# Patient Record
Sex: Male | Born: 1945
Health system: Southern US, Community
[De-identification: ages and names within clinical notes are randomized; demographics above are authoritative.]

## PROBLEM LIST (undated history)

## (undated) DIAGNOSIS — J449 Chronic obstructive pulmonary disease, unspecified: Secondary | ICD-10-CM

## (undated) DIAGNOSIS — M67911 Unspecified disorder of synovium and tendon, right shoulder: Secondary | ICD-10-CM

## (undated) DIAGNOSIS — I10 Essential (primary) hypertension: Secondary | ICD-10-CM

## (undated) DIAGNOSIS — J45909 Unspecified asthma, uncomplicated: Secondary | ICD-10-CM

## (undated) DIAGNOSIS — M47812 Spondylosis without myelopathy or radiculopathy, cervical region: Secondary | ICD-10-CM

## (undated) DIAGNOSIS — E119 Type 2 diabetes mellitus without complications: Secondary | ICD-10-CM

## (undated) DIAGNOSIS — N429 Disorder of prostate, unspecified: Secondary | ICD-10-CM

## (undated) DIAGNOSIS — M1990 Unspecified osteoarthritis, unspecified site: Secondary | ICD-10-CM

## (undated) DIAGNOSIS — M67912 Unspecified disorder of synovium and tendon, left shoulder: Secondary | ICD-10-CM

## (undated) HISTORY — PX: PENILE ADHESIONS LYSIS: SHX2198

## (undated) HISTORY — PX: ANKLE SURGERY: SHX546

## (undated) HISTORY — PX: OTHER SURGICAL HISTORY: SHX169

---

## 2002-10-06 ENCOUNTER — Emergency Department (HOSPITAL_COMMUNITY): Admission: EM | Admit: 2002-10-06 | Discharge: 2002-10-06 | Payer: Self-pay | Admitting: Emergency Medicine

## 2003-04-27 ENCOUNTER — Emergency Department (HOSPITAL_COMMUNITY): Admission: EM | Admit: 2003-04-27 | Discharge: 2003-04-27 | Payer: Self-pay | Admitting: Emergency Medicine

## 2003-04-27 ENCOUNTER — Encounter: Payer: Self-pay | Admitting: Emergency Medicine

## 2013-03-16 ENCOUNTER — Emergency Department (HOSPITAL_COMMUNITY)
Admission: EM | Admit: 2013-03-16 | Discharge: 2013-03-16 | Disposition: A | Discharge status: Home / Self Care | Payer: Medicare Other | Attending: Emergency Medicine | Admitting: Emergency Medicine

## 2013-03-16 ENCOUNTER — Encounter (HOSPITAL_COMMUNITY): Payer: Self-pay | Admitting: Emergency Medicine

## 2013-03-16 DIAGNOSIS — T63461A Toxic effect of venom of wasps, accidental (unintentional), initial encounter: Secondary | ICD-10-CM | POA: Insufficient documentation

## 2013-03-16 DIAGNOSIS — R220 Localized swelling, mass and lump, head: Secondary | ICD-10-CM | POA: Insufficient documentation

## 2013-03-16 DIAGNOSIS — Z79899 Other long term (current) drug therapy: Secondary | ICD-10-CM | POA: Insufficient documentation

## 2013-03-16 DIAGNOSIS — J45909 Unspecified asthma, uncomplicated: Secondary | ICD-10-CM | POA: Insufficient documentation

## 2013-03-16 DIAGNOSIS — T6391XA Toxic effect of contact with unspecified venomous animal, accidental (unintentional), initial encounter: Secondary | ICD-10-CM | POA: Insufficient documentation

## 2013-03-16 DIAGNOSIS — Y929 Unspecified place or not applicable: Secondary | ICD-10-CM | POA: Insufficient documentation

## 2013-03-16 DIAGNOSIS — I10 Essential (primary) hypertension: Secondary | ICD-10-CM | POA: Insufficient documentation

## 2013-03-16 DIAGNOSIS — Y939 Activity, unspecified: Secondary | ICD-10-CM | POA: Insufficient documentation

## 2013-03-16 DIAGNOSIS — E119 Type 2 diabetes mellitus without complications: Secondary | ICD-10-CM | POA: Insufficient documentation

## 2013-03-16 DIAGNOSIS — L539 Erythematous condition, unspecified: Secondary | ICD-10-CM | POA: Insufficient documentation

## 2013-03-16 DIAGNOSIS — J449 Chronic obstructive pulmonary disease, unspecified: Secondary | ICD-10-CM | POA: Insufficient documentation

## 2013-03-16 DIAGNOSIS — M2540 Effusion, unspecified joint: Secondary | ICD-10-CM | POA: Insufficient documentation

## 2013-03-16 DIAGNOSIS — Z8739 Personal history of other diseases of the musculoskeletal system and connective tissue: Secondary | ICD-10-CM | POA: Insufficient documentation

## 2013-03-16 DIAGNOSIS — J4489 Other specified chronic obstructive pulmonary disease: Secondary | ICD-10-CM | POA: Insufficient documentation

## 2013-03-16 HISTORY — DX: Unspecified asthma, uncomplicated: J45.909

## 2013-03-16 HISTORY — DX: Type 2 diabetes mellitus without complications: E11.9

## 2013-03-16 HISTORY — DX: Chronic obstructive pulmonary disease, unspecified: J44.9

## 2013-03-16 HISTORY — DX: Unspecified osteoarthritis, unspecified site: M19.90

## 2013-03-16 HISTORY — DX: Essential (primary) hypertension: I10

## 2013-03-16 MED ORDER — DEXAMETHASONE SODIUM PHOSPHATE 10 MG/ML IJ SOLN
10.0000 mg | Freq: Once | INTRAMUSCULAR | Status: AC
  Administered 2013-03-16: 10 mg via INTRAMUSCULAR
  Filled 2013-03-16: qty 1

## 2013-03-16 NOTE — ED Notes (Signed)
Swelling, pain lt elbow and forearm. Says he was bitten by insect or ? Bee yesterday. Good radial pulse. Alert, NAD

## 2013-03-16 NOTE — ED Notes (Signed)
States that he was stung by a hornet yesterday and his left elbow is swollen and itching.

## 2013-03-18 NOTE — ED Provider Notes (Signed)
CSN: [id number]     Arrival date & time 03/16/13  1354 History     First MD Initiated Contact with Patient 03/16/13 1406     Chief Complaint  Patient presents with  . Insect Bite   (Consider location/radiation/quality/duration/timing/severity/associated sxs/prior Treatment) HPI Comments: Ronald Ward is a 67 y.o. Male presenting with pain and itching,  Swollen left elbow since being stung by a hornet yesterday afternoon.  He does have a history of cough allergy to bee stings, but denies this symptom today along with no complaint of shortness of breath, mouth, tongue or throat swelling or discomfort.  He took benadryl 25 mg prior to arrival.  He has not applied ice to the area. He denies radiation of pain.      The history is provided by the patient.    Past Medical History  Diagnosis Date  . Diabetes mellitus without complication   . COPD (chronic obstructive pulmonary disease)   . Hypertension   . Asthma   . Arthritis    History reviewed. No pertinent past surgical history. No family history on file. History  Substance Use Topics  . Smoking status: Not on file  . Smokeless tobacco: Not on file  . Alcohol Use: Not on file    Review of Systems  Constitutional: Negative for fever.  HENT: Positive for facial swelling. Negative for congestion and voice change.   Respiratory: Negative for shortness of breath, wheezing and stridor.   Musculoskeletal: Positive for joint swelling and arthralgias. Negative for myalgias.  Skin: Negative for color change, rash and wound.  Neurological: Negative for weakness and numbness.    Allergies  Bee venom; Fish allergy; and Shellfish allergy  Home Medications   Current Outpatient Rx  Name  Route  Sig  Dispense  Refill  . albuterol (PROVENTIL HFA;VENTOLIN HFA) 108 (90 BASE) MCG/ACT inhaler   Inhalation   Inhale 2 puffs into the lungs 2 (two) times daily.         . beclomethasone (QVAR) 40 MCG/ACT inhaler   Inhalation   Inhale 2  puffs into the lungs 2 (two) times daily.         Marland Kitchen doxazosin (CARDURA) 2 MG tablet   Oral   Take 2 mg by mouth daily.         . enalapril (VASOTEC) 2.5 MG tablet   Oral   Take 2.5 mg by mouth daily.         Marland Kitchen glipiZIDE (GLUCOTROL XL) 2.5 MG 24 hr tablet   Oral   Take 2.5 mg by mouth daily.         . hydrochlorothiazide (HYDRODIURIL) 25 MG tablet   Oral   Take 25 mg by mouth daily.         . metFORMIN (GLUCOPHAGE) 1000 MG tablet   Oral   Take 1,000 mg by mouth 2 (two) times daily with a meal.         . naproxen (NAPROSYN) 500 MG tablet   Oral   Take 500 mg by mouth 2 (two) times daily with a meal.         . simvastatin (ZOCOR) 20 MG tablet   Oral   Take 20 mg by mouth every evening.          There were no vitals taken for this visit. Physical Exam  Constitutional: He appears well-developed and well-nourished. No distress.  HENT:  Head: Normocephalic.  Neck: Neck supple.  Cardiovascular: Normal rate.   Pulmonary/Chest: Effort  normal. No stridor. No respiratory distress. He has no wheezes. He has no rales.  Musculoskeletal: Normal range of motion. He exhibits no edema.  Lymphadenopathy:    He has no cervical adenopathy.  Skin: No rash noted. There is erythema.  Localized edema and erythema at left posterior elbow.  No appreciable puncture wound or retained fb.  No red streaking.    ED Course   Procedures (including critical care time)  Labs Reviewed - No data to display No results found. 1. Bee sting reaction, initial encounter     MDM  Localized reaction to hornet/wasp sting.  No signs of anaphylaxis for this injury that occurred almost 24 hours ago.  Ice pack was applied to the site.  He was given decadron injection,  Encouraged continued ice at home,  Benadryl 25-50 mg q 6 hours for the next 2 days,  Then prn. No sign of infection at this site.  Evalee Jefferson, PA-C 03/18/13 [phone]

## 2013-03-18 NOTE — ED Provider Notes (Signed)
Medical screening examination/treatment/procedure(s) were performed by non-physician practitioner and as supervising physician I was immediately available for consultation/collaboration.   Ezequiel Essex, MD 03/18/13 1020

## 2013-05-19 ENCOUNTER — Ambulatory Visit (INDEPENDENT_AMBULATORY_CARE_PROVIDER_SITE_OTHER): Payer: Medicare Other | Admitting: Urology

## 2013-05-19 DIAGNOSIS — N400 Enlarged prostate without lower urinary tract symptoms: Secondary | ICD-10-CM

## 2013-05-19 DIAGNOSIS — N529 Male erectile dysfunction, unspecified: Secondary | ICD-10-CM

## 2013-05-19 DIAGNOSIS — R972 Elevated prostate specific antigen [PSA]: Secondary | ICD-10-CM

## 2013-07-21 ENCOUNTER — Ambulatory Visit: Payer: Medicare Other | Admitting: Urology

## 2013-08-18 ENCOUNTER — Ambulatory Visit (INDEPENDENT_AMBULATORY_CARE_PROVIDER_SITE_OTHER): Payer: Medicare HMO | Admitting: Urology

## 2013-08-18 DIAGNOSIS — R972 Elevated prostate specific antigen [PSA]: Secondary | ICD-10-CM

## 2013-08-25 ENCOUNTER — Other Ambulatory Visit: Payer: Self-pay | Admitting: Urology

## 2013-08-25 DIAGNOSIS — R972 Elevated prostate specific antigen [PSA]: Secondary | ICD-10-CM

## 2013-09-15 ENCOUNTER — Other Ambulatory Visit: Payer: Self-pay | Admitting: Urology

## 2013-09-15 ENCOUNTER — Ambulatory Visit (HOSPITAL_COMMUNITY)
Admission: RE | Admit: 2013-09-15 | Discharge: 2013-09-15 | Disposition: A | Discharge status: Home / Self Care | Payer: Medicare HMO | Source: Ambulatory Visit | Attending: Urology | Admitting: Urology

## 2013-09-15 DIAGNOSIS — R972 Elevated prostate specific antigen [PSA]: Secondary | ICD-10-CM

## 2013-09-15 MED ORDER — GENTAMICIN SULFATE 40 MG/ML IJ SOLN
160.0000 mg | Freq: Once | INTRAMUSCULAR | Status: AC
  Administered 2013-09-15: 160 mg via INTRAMUSCULAR

## 2013-09-15 MED ORDER — GENTAMICIN SULFATE 40 MG/ML IJ SOLN
INTRAMUSCULAR | Status: AC
  Administered 2013-09-15: 12:00:00 160 mg via INTRAMUSCULAR
  Filled 2013-09-15: qty 4

## 2013-09-15 MED ORDER — LIDOCAINE HCL (PF) 2 % IJ SOLN
10.0000 mL | Freq: Once | INTRAMUSCULAR | Status: AC
  Administered 2013-09-15: 10 mL

## 2013-09-15 MED ORDER — LIDOCAINE HCL (PF) 2 % IJ SOLN
INTRAMUSCULAR | Status: AC
  Administered 2013-09-15: 12:00:00 10 mL
  Filled 2013-09-15: qty 10

## 2013-09-15 NOTE — Discharge Instructions (Signed)
Prostate Biopsy TRUS Biopsy BEFORE THE TEST   Do not take aspirin. Do not take any medicine that has aspirin in it 7 days before your biopsy.  You may be given a medicine to take on the day of your biopsy.  You may also be given a medicine or treatment to help you go poop (laxative or enema). AFTER THE TEST  Only take medicine as told by your doctor.  It is normal to have some bleeding from your rectum for the first 5 days.  You may have blood in your pee (urine) or sperm. Finding out the results of your test Ask when your test results will be ready. Make sure you get your test results. GET HELP RIGHT AWAY IF:  You have a temperature by mouth above 102 F (38.9 C), not controlled by medicine.  You have blood in your pee for more than 5 days.  You have a lot of blood in your pee.  You have bleeding from your rectum for more than 5 days or have a lot of blood in your poop (feces).  You have severe pain. Document Released: 07/18/2009 Document Revised: 10/22/2011 Document Reviewed: 03/18/2013 [facility] Patient Information 2014 [place], [place].

## 2013-09-15 NOTE — Progress Notes (Signed)
Biopsy complete no signs of distress  

## 2013-09-21 ENCOUNTER — Encounter: Payer: Self-pay | Admitting: Urology

## 2013-10-06 ENCOUNTER — Ambulatory Visit (INDEPENDENT_AMBULATORY_CARE_PROVIDER_SITE_OTHER): Payer: Medicare HMO | Admitting: Urology

## 2013-10-06 DIAGNOSIS — R972 Elevated prostate specific antigen [PSA]: Secondary | ICD-10-CM

## 2013-10-06 DIAGNOSIS — N529 Male erectile dysfunction, unspecified: Secondary | ICD-10-CM

## 2014-01-18 ENCOUNTER — Other Ambulatory Visit (HOSPITAL_COMMUNITY): Payer: Self-pay | Admitting: Family Medicine

## 2014-01-18 DIAGNOSIS — M542 Cervicalgia: Secondary | ICD-10-CM

## 2014-01-20 ENCOUNTER — Ambulatory Visit (HOSPITAL_COMMUNITY)
Admission: RE | Admit: 2014-01-20 | Discharge: 2014-01-20 | Disposition: A | Discharge status: Home / Self Care | Payer: Medicare HMO | Source: Ambulatory Visit | Attending: Family Medicine | Admitting: Family Medicine

## 2014-01-20 ENCOUNTER — Encounter (HOSPITAL_COMMUNITY): Payer: Self-pay

## 2014-01-20 DIAGNOSIS — M5412 Radiculopathy, cervical region: Secondary | ICD-10-CM | POA: Insufficient documentation

## 2014-01-20 DIAGNOSIS — M25519 Pain in unspecified shoulder: Secondary | ICD-10-CM | POA: Insufficient documentation

## 2014-01-20 DIAGNOSIS — M4802 Spinal stenosis, cervical region: Secondary | ICD-10-CM | POA: Insufficient documentation

## 2014-01-20 DIAGNOSIS — M5020 Other cervical disc displacement, unspecified cervical region: Secondary | ICD-10-CM | POA: Insufficient documentation

## 2014-01-20 DIAGNOSIS — IMO0002 Reserved for concepts with insufficient information to code with codable children: Secondary | ICD-10-CM | POA: Insufficient documentation

## 2014-01-20 DIAGNOSIS — M542 Cervicalgia: Secondary | ICD-10-CM | POA: Insufficient documentation

## 2014-01-20 DIAGNOSIS — M5380 Other specified dorsopathies, site unspecified: Secondary | ICD-10-CM | POA: Insufficient documentation

## 2014-03-11 ENCOUNTER — Ambulatory Visit (INDEPENDENT_AMBULATORY_CARE_PROVIDER_SITE_OTHER): Payer: Medicare HMO

## 2014-03-11 ENCOUNTER — Ambulatory Visit: Payer: Self-pay | Admitting: Orthopedic Surgery

## 2014-03-11 ENCOUNTER — Ambulatory Visit (INDEPENDENT_AMBULATORY_CARE_PROVIDER_SITE_OTHER): Payer: Medicare HMO | Admitting: Orthopedic Surgery

## 2014-03-11 VITALS — BP 110/66 | Ht 67.0 in | Wt 152.0 lb

## 2014-03-11 DIAGNOSIS — M25519 Pain in unspecified shoulder: Secondary | ICD-10-CM

## 2014-03-11 DIAGNOSIS — M67919 Unspecified disorder of synovium and tendon, unspecified shoulder: Secondary | ICD-10-CM

## 2014-03-11 DIAGNOSIS — M719 Bursopathy, unspecified: Secondary | ICD-10-CM

## 2014-03-11 DIAGNOSIS — M67912 Unspecified disorder of synovium and tendon, left shoulder: Principal | ICD-10-CM

## 2014-03-11 DIAGNOSIS — M47812 Spondylosis without myelopathy or radiculopathy, cervical region: Secondary | ICD-10-CM | POA: Insufficient documentation

## 2014-03-11 DIAGNOSIS — M19019 Primary osteoarthritis, unspecified shoulder: Secondary | ICD-10-CM | POA: Insufficient documentation

## 2014-03-11 DIAGNOSIS — M67911 Unspecified disorder of synovium and tendon, right shoulder: Secondary | ICD-10-CM

## 2014-03-11 MED ORDER — NABUMETONE 500 MG PO TABS: 500.0000 mg | ORAL_TABLET | Freq: Two times a day (BID) | ORAL | Status: DC

## 2014-03-11 NOTE — Progress Notes (Signed)
Subjective:     Patient ID: Ronald Ward, male   DOB: July 23, 1946, 68 y.o.   MRN: [id number]  Shoulder Pain    Chief Complaint  Patient presents with  . Shoulder Pain    Bilateral shoulder pain. Consult from DR. Cabbell    68 year old male 6 month history of bilateral shoulder pain described as being in the joint. He complains of sharp discomfort in the anterior joint line and the morning and night which is 6/10 unrelieved by oxycodone 5 mg 3 times a day. He took some Naprosyn but is concerned about the GI side effects. He comes in for evaluation and treatment at the request of Dr. Cyndy Freeze neurosurgeon. He does not complain of stiffness or weakness or loss of motion. His medical history of diabetes asthma emphysema and generalized arthritis  He had surgery on his left ankle and he had mandibular  He denies medication allergies  Family history of diabetes hypertension and cancer  Social history denies any drug use  Current medications oxycodone 5 mg 3 times a day along with the following Current outpatient prescriptions:albuterol (PROVENTIL HFA;VENTOLIN HFA) 108 (90 BASE) MCG/ACT inhaler, Inhale 2 puffs into the lungs 2 (two) times daily., Disp: , Rfl: ;  beclomethasone (QVAR) 40 MCG/ACT inhaler, Inhale 2 puffs into the lungs 2 (two) times daily., Disp: , Rfl: ;  doxazosin (CARDURA) 2 MG tablet, Take 2 mg by mouth daily., Disp: , Rfl: ;  enalapril (VASOTEC) 2.5 MG tablet, Take 2.5 mg by mouth daily., Disp: , Rfl:  glipiZIDE (GLUCOTROL XL) 2.5 MG 24 hr tablet, Take 2.5 mg by mouth daily., Disp: , Rfl: ;  hydrochlorothiazide (HYDRODIURIL) 25 MG tablet, Take 25 mg by mouth daily., Disp: , Rfl: ;  metFORMIN (GLUCOPHAGE) 1000 MG tablet, Take 1,000 mg by mouth 2 (two) times daily with a meal., Disp: , Rfl: ;  nabumetone (RELAFEN) 500 MG tablet, Take 1 tablet (500 mg total) by mouth 2 (two) times daily., Disp: 60 tablet, Rfl: 1 naproxen (NAPROSYN) 500 MG tablet, Take 500 mg by mouth 2 (two) times  daily with a meal., Disp: , Rfl: ;  simvastatin (ZOCOR) 20 MG tablet, Take 20 mg by mouth every evening., Disp: , Rfl:    Review of Systems Review of systems has been recorded reviewed and signed and scanned into the chart     Objective:   Physical Exam BP 110/66  Ht 5\' 7"  (1.702 m)  Wt 152 lb (68.947 kg)  BMI 23.80 kg/m2 general appearance is normal. He is oriented x3 his mood and affect are normal. He has normal gait pattern without nystagmus  He has mild tenderness in his cervical spine no deformity. Some stiffness with minimal range of motion loss. Muscle tone is normal. Skin is normal except for multiple moles.  Lower extremity exam The right and left lower extremity: Inspection and palpation revealed no tenderness or abnormality in alignment in the lower extremities. Range of motion is full.  Strength is grade 5. All joints are stable.  As far as his shoulder goes he has tenderness in the anterior joint line he has normal external rotation and forward elevation with decreased internal rotation bilaterally. Both shoulders are stable. Both rotator cuff set show 5 out of 5 manual muscle testing skin along the arm distal pulses radial and ulnar normal lymph nodes normal sensation normal reflexes normal coordination normal   Cervical spine MRI shows 4 level degenerative disc disease. There is some evidence of nerve root impingement.  Shoulder  x-ray show mild shoulder arthritis with a patent glenohumeral joint and mild inferior osteophytes. Mild greater tuberosity chronic sclerosis and acromial subacromial sclerosis suggests chronic rotator cuff disease     Assessment:     Arthritis of the glenohumeral joint Cervical spondylosis Chronic rotator cuff disease    Plan:     Subacromial injection, Relafen 500 mg twice a day

## 2014-03-11 NOTE — Patient Instructions (Signed)

## 2014-04-06 ENCOUNTER — Ambulatory Visit (INDEPENDENT_AMBULATORY_CARE_PROVIDER_SITE_OTHER): Payer: Medicare HMO | Admitting: Urology

## 2014-04-06 DIAGNOSIS — R972 Elevated prostate specific antigen [PSA]: Secondary | ICD-10-CM

## 2014-04-06 DIAGNOSIS — N400 Enlarged prostate without lower urinary tract symptoms: Secondary | ICD-10-CM

## 2014-04-06 DIAGNOSIS — N529 Male erectile dysfunction, unspecified: Secondary | ICD-10-CM

## 2014-09-02 ENCOUNTER — Emergency Department (HOSPITAL_COMMUNITY): Payer: No Typology Code available for payment source

## 2014-09-02 ENCOUNTER — Encounter (HOSPITAL_COMMUNITY): Payer: Self-pay | Admitting: Cardiology

## 2014-09-02 ENCOUNTER — Emergency Department (HOSPITAL_COMMUNITY)
Admission: EM | Admit: 2014-09-02 | Discharge: 2014-09-02 | Disposition: A | Discharge status: Home / Self Care | Payer: No Typology Code available for payment source | Attending: Emergency Medicine | Admitting: Emergency Medicine

## 2014-09-02 DIAGNOSIS — Y9389 Activity, other specified: Secondary | ICD-10-CM | POA: Diagnosis not present

## 2014-09-02 DIAGNOSIS — M1990 Unspecified osteoarthritis, unspecified site: Secondary | ICD-10-CM | POA: Insufficient documentation

## 2014-09-02 DIAGNOSIS — S299XXA Unspecified injury of thorax, initial encounter: Secondary | ICD-10-CM | POA: Diagnosis present

## 2014-09-02 DIAGNOSIS — J449 Chronic obstructive pulmonary disease, unspecified: Secondary | ICD-10-CM | POA: Insufficient documentation

## 2014-09-02 DIAGNOSIS — E119 Type 2 diabetes mellitus without complications: Secondary | ICD-10-CM | POA: Diagnosis not present

## 2014-09-02 DIAGNOSIS — S134XXA Sprain of ligaments of cervical spine, initial encounter: Secondary | ICD-10-CM | POA: Insufficient documentation

## 2014-09-02 DIAGNOSIS — S43401A Unspecified sprain of right shoulder joint, initial encounter: Secondary | ICD-10-CM | POA: Insufficient documentation

## 2014-09-02 DIAGNOSIS — R52 Pain, unspecified: Secondary | ICD-10-CM

## 2014-09-02 DIAGNOSIS — Y92410 Unspecified street and highway as the place of occurrence of the external cause: Secondary | ICD-10-CM | POA: Diagnosis not present

## 2014-09-02 DIAGNOSIS — S8391XA Sprain of unspecified site of right knee, initial encounter: Secondary | ICD-10-CM | POA: Insufficient documentation

## 2014-09-02 DIAGNOSIS — Z791 Long term (current) use of non-steroidal anti-inflammatories (NSAID): Secondary | ICD-10-CM | POA: Diagnosis not present

## 2014-09-02 DIAGNOSIS — S298XXA Other specified injuries of thorax, initial encounter: Secondary | ICD-10-CM

## 2014-09-02 DIAGNOSIS — I10 Essential (primary) hypertension: Secondary | ICD-10-CM | POA: Diagnosis not present

## 2014-09-02 DIAGNOSIS — Y998 Other external cause status: Secondary | ICD-10-CM | POA: Insufficient documentation

## 2014-09-02 DIAGNOSIS — Z720 Tobacco use: Secondary | ICD-10-CM | POA: Diagnosis not present

## 2014-09-02 DIAGNOSIS — S139XXA Sprain of joints and ligaments of unspecified parts of neck, initial encounter: Secondary | ICD-10-CM

## 2014-09-02 DIAGNOSIS — Z7952 Long term (current) use of systemic steroids: Secondary | ICD-10-CM | POA: Insufficient documentation

## 2014-09-02 DIAGNOSIS — Z23 Encounter for immunization: Secondary | ICD-10-CM | POA: Diagnosis not present

## 2014-09-02 MED ORDER — OXYCODONE-ACETAMINOPHEN 5-325 MG PO TABS
2.0000 | ORAL_TABLET | Freq: Once | ORAL | Status: AC
  Administered 2014-09-02: 2 via ORAL
  Filled 2014-09-02: qty 2

## 2014-09-02 MED ORDER — OXYCODONE-ACETAMINOPHEN 5-325 MG PO TABS: 1.0000 | ORAL_TABLET | ORAL | Status: DC | PRN

## 2014-09-02 MED ORDER — TETANUS-DIPHTH-ACELL PERTUSSIS 5-2.5-18.5 LF-MCG/0.5 IM SUSP
0.5000 mL | Freq: Once | INTRAMUSCULAR | Status: AC
  Administered 2014-09-02: 0.5 mL via INTRAMUSCULAR
  Filled 2014-09-02: qty 0.5

## 2014-09-02 NOTE — ED Notes (Signed)
Patient ambulated in hall without assistance.  Patient denies any dizziness or SOB.  O2 Sat maintained at 96% - 97%.
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
## 2014-12-15 ENCOUNTER — Other Ambulatory Visit (HOSPITAL_COMMUNITY): Payer: Self-pay | Admitting: Family Medicine

## 2014-12-15 DIAGNOSIS — R52 Pain, unspecified: Secondary | ICD-10-CM

## 2014-12-17 ENCOUNTER — Ambulatory Visit (HOSPITAL_COMMUNITY)
Admission: RE | Admit: 2014-12-17 | Discharge: 2014-12-17 | Disposition: A | Discharge status: Home / Self Care | Payer: Medicare HMO | Source: Ambulatory Visit | Attending: Family Medicine | Admitting: Family Medicine

## 2014-12-17 DIAGNOSIS — R109 Unspecified abdominal pain: Secondary | ICD-10-CM | POA: Diagnosis not present

## 2014-12-17 DIAGNOSIS — R52 Pain, unspecified: Secondary | ICD-10-CM

## 2014-12-17 LAB — POCT I-STAT CREATININE: Creatinine, Ser: 1.1 mg/dL (ref 0.61–1.24)

## 2014-12-17 MED ORDER — IOHEXOL 300 MG/ML  SOLN
100.0000 mL | Freq: Once | INTRAMUSCULAR | Status: AC | PRN
  Administered 2014-12-17: 100 mL via INTRAVENOUS

## 2015-04-05 ENCOUNTER — Ambulatory Visit (INDEPENDENT_AMBULATORY_CARE_PROVIDER_SITE_OTHER): Payer: Medicare HMO | Admitting: Urology

## 2015-04-05 DIAGNOSIS — R972 Elevated prostate specific antigen [PSA]: Secondary | ICD-10-CM | POA: Diagnosis not present

## 2015-04-05 DIAGNOSIS — N400 Enlarged prostate without lower urinary tract symptoms: Secondary | ICD-10-CM

## 2015-04-05 DIAGNOSIS — N5201 Erectile dysfunction due to arterial insufficiency: Secondary | ICD-10-CM

## 2015-05-31 ENCOUNTER — Ambulatory Visit (INDEPENDENT_AMBULATORY_CARE_PROVIDER_SITE_OTHER): Payer: Medicare HMO | Admitting: Urology

## 2015-05-31 DIAGNOSIS — N5201 Erectile dysfunction due to arterial insufficiency: Secondary | ICD-10-CM

## 2015-05-31 DIAGNOSIS — N401 Enlarged prostate with lower urinary tract symptoms: Secondary | ICD-10-CM

## 2015-10-03 ENCOUNTER — Emergency Department (HOSPITAL_COMMUNITY): Payer: Medicare HMO

## 2015-10-03 ENCOUNTER — Emergency Department (HOSPITAL_COMMUNITY)
Admission: EM | Admit: 2015-10-03 | Discharge: 2015-10-03 | Disposition: A | Discharge status: Home / Self Care | Payer: Medicare HMO | Attending: Emergency Medicine | Admitting: Emergency Medicine

## 2015-10-03 ENCOUNTER — Encounter (HOSPITAL_COMMUNITY): Payer: Self-pay | Admitting: *Deleted

## 2015-10-03 DIAGNOSIS — J45909 Unspecified asthma, uncomplicated: Secondary | ICD-10-CM | POA: Diagnosis not present

## 2015-10-03 DIAGNOSIS — Z7984 Long term (current) use of oral hypoglycemic drugs: Secondary | ICD-10-CM | POA: Insufficient documentation

## 2015-10-03 DIAGNOSIS — I10 Essential (primary) hypertension: Secondary | ICD-10-CM | POA: Diagnosis not present

## 2015-10-03 DIAGNOSIS — F17200 Nicotine dependence, unspecified, uncomplicated: Secondary | ICD-10-CM | POA: Insufficient documentation

## 2015-10-03 DIAGNOSIS — J440 Chronic obstructive pulmonary disease with acute lower respiratory infection: Secondary | ICD-10-CM | POA: Diagnosis not present

## 2015-10-03 DIAGNOSIS — E119 Type 2 diabetes mellitus without complications: Secondary | ICD-10-CM | POA: Insufficient documentation

## 2015-10-03 DIAGNOSIS — Z79899 Other long term (current) drug therapy: Secondary | ICD-10-CM | POA: Diagnosis not present

## 2015-10-03 DIAGNOSIS — R05 Cough: Secondary | ICD-10-CM | POA: Diagnosis present

## 2015-10-03 DIAGNOSIS — J40 Bronchitis, not specified as acute or chronic: Secondary | ICD-10-CM

## 2015-10-03 LAB — BASIC METABOLIC PANEL
ANION GAP: 8 (ref 5–15)
BUN: 17 mg/dL (ref 6–20)
CALCIUM: 9.5 mg/dL (ref 8.9–10.3)
CO2: 31 mmol/L (ref 22–32)
Chloride: 101 mmol/L (ref 101–111)
Creatinine, Ser: 0.96 mg/dL (ref 0.61–1.24)
Glucose, Bld: 226 mg/dL — ABNORMAL HIGH (ref 65–99)
POTASSIUM: 4.6 mmol/L (ref 3.5–5.1)
SODIUM: 140 mmol/L (ref 135–145)

## 2015-10-03 LAB — CBC WITH DIFFERENTIAL/PLATELET
BASOS ABS: 0 10*3/uL (ref 0.0–0.1)
Basophils Relative: 0 %
EOS ABS: 0.1 10*3/uL (ref 0.0–0.7)
EOS PCT: 1 %
HCT: 41.3 % (ref 39.0–52.0)
Hemoglobin: 13.8 g/dL (ref 13.0–17.0)
LYMPHS ABS: 2.6 10*3/uL (ref 0.7–4.0)
Lymphocytes Relative: 29 %
MCH: 31.4 pg (ref 26.0–34.0)
MCHC: 33.4 g/dL (ref 30.0–36.0)
MCV: 94.1 fL (ref 78.0–100.0)
Monocytes Absolute: 1.2 10*3/uL — ABNORMAL HIGH (ref 0.1–1.0)
Monocytes Relative: 13 %
NEUTROS PCT: 57 %
Neutro Abs: 5.2 10*3/uL (ref 1.7–7.7)
PLATELETS: 271 10*3/uL (ref 150–400)
RBC: 4.39 MIL/uL (ref 4.22–5.81)
RDW: 13.1 % (ref 11.5–15.5)
WBC: 9 10*3/uL (ref 4.0–10.5)

## 2015-10-03 MED ORDER — GUAIFENESIN 100 MG/5ML PO LIQD: 200.0000 mg | ORAL | Status: DC | PRN

## 2015-10-03 MED ORDER — PREDNISONE 20 MG PO TABS: 40.0000 mg | ORAL_TABLET | Freq: Every day | ORAL | Status: DC

## 2015-10-03 MED ORDER — DOXYCYCLINE HYCLATE 100 MG PO CAPS: 100.0000 mg | ORAL_CAPSULE | Freq: Two times a day (BID) | ORAL | Status: DC

## 2015-10-03 MED ORDER — PREDNISONE 50 MG PO TABS
60.0000 mg | ORAL_TABLET | Freq: Once | ORAL | Status: AC
  Administered 2015-10-03: 60 mg via ORAL
  Filled 2015-10-03: qty 1

## 2015-10-03 MED ORDER — ALBUTEROL SULFATE (2.5 MG/3ML) 0.083% IN NEBU
2.5000 mg | INHALATION_SOLUTION | Freq: Once | RESPIRATORY_TRACT | Status: AC
  Administered 2015-10-03: 2.5 mg via RESPIRATORY_TRACT
  Filled 2015-10-03: qty 3

## 2015-10-03 MED ORDER — IPRATROPIUM-ALBUTEROL 0.5-2.5 (3) MG/3ML IN SOLN
3.0000 mL | Freq: Once | RESPIRATORY_TRACT | Status: AC
  Administered 2015-10-03: 3 mL via RESPIRATORY_TRACT
  Filled 2015-10-03: qty 3

## 2015-10-03 NOTE — ED Provider Notes (Signed)
Patient seen and examined. Discussed with Tammy Triplett PA-C. Please see her associated note. Patient with clear lungs. Some scattered rhonchi. Normal chest x-ray. Not febrile or hypoxemic. Agree with treatment with antibiotics, steroids, bronchodilators, mucolytic's.  Tanna Furry, MD 10/03/15 [phone]

## 2015-10-03 NOTE — ED Notes (Signed)
Ambulated in hallway.  Tolerated well.  Sats 96-99% on RA and HR 80-88.

## 2015-10-03 NOTE — ED Notes (Signed)
Pt states productive cough x 2 weeks (thick and yellow in color). States intermittent fever and chills.

## 2015-10-10 NOTE — ED Provider Notes (Cosign Needed)
CSN: [id number]     Arrival date & time 10/03/15  1137 History   First MD Initiated Contact with Patient 10/03/15 1607     Chief Complaint  Patient presents with  . Cough     (Consider location/radiation/quality/duration/timing/severity/associated sxs/prior Treatment) HPI   Ronald Ward is a 70 y.o. male with hx of COPD, DM and asthma who presents to the Emergency Department complaining of productive cough for 2 weeks.  Cough productive of yellow sputum and also has associated fever, nasal congestion and chills at home, Tmax unknown.  He states that he has been using his inhaler and taking OTC cold medications without relief.  He denies shortness of breath, chest pain, abdominal pain or recent wheezing.      Past Medical History  Diagnosis Date  . Diabetes mellitus without complication ([place])   . COPD (chronic obstructive pulmonary disease) ([place])   . Hypertension   . Asthma   . Arthritis    History reviewed. No pertinent past surgical history. No family history on file. Social History  Substance Use Topics  . Smoking status: Current Every Day Smoker  . Smokeless tobacco: None  . Alcohol Use: No    Review of Systems  Constitutional: Negative for fever, chills and fatigue.  HENT: Negative for sore throat and trouble swallowing.   Respiratory: Positive for cough. Negative for shortness of breath and wheezing.   Cardiovascular: Negative for chest pain and palpitations.  Gastrointestinal: Negative for nausea, vomiting, abdominal pain and blood in stool.  Genitourinary: Negative for dysuria and flank pain.  Musculoskeletal: Negative for myalgias, back pain, arthralgias, neck pain and neck stiffness.  Skin: Negative for rash.  Neurological: Negative for dizziness, weakness and numbness.  Hematological: Does not bruise/bleed easily.      Allergies  Bee venom; Fish allergy; and Shellfish allergy  Home Medications   Prior to Admission medications   Medication Sig Start  Date End Date Taking? Authorizing Provider  albuterol (PROVENTIL HFA;VENTOLIN HFA) 108 (90 BASE) MCG/ACT inhaler Inhale 2 puffs into the lungs 2 (two) times daily.   Yes Historical Provider, MD  beclomethasone (QVAR) 40 MCG/ACT inhaler Inhale 2 puffs into the lungs 2 (two) times daily.   Yes Historical Provider, MD  doxazosin (CARDURA) 2 MG tablet Take 2 mg by mouth daily.   Yes Historical Provider, MD  enalapril (VASOTEC) 2.5 MG tablet Take 2.5 mg by mouth daily.   Yes Historical Provider, MD  glipiZIDE (GLUCOTROL XL) 2.5 MG 24 hr tablet Take 2.5 mg by mouth daily.   Yes Historical Provider, MD  HYDROcodone-acetaminophen (NORCO/VICODIN) 5-325 MG tablet Take 1 tablet by mouth 4 (four) times daily.   Yes Historical Provider, MD  metFORMIN (GLUCOPHAGE) 1000 MG tablet Take 1,000 mg by mouth 2 (two) times daily with a meal.   Yes Historical Provider, MD  Phenyleph-CPM-DM-Aspirin (ALKA-SELTZER PLUS COLD & COUGH) 7.03-14-09-325 MG TBEF Take 1 tablet by mouth daily as needed (for cold and flu like symptoms).   Yes Historical Provider, MD  Phenylephrine-DM-GG-APAP (TYLENOL COLD/FLU SEVERE) 5-10-200-325 MG TABS Take 1-2 capsules by mouth daily as needed (for cold and flu like symptoms).   Yes Historical Provider, MD  simvastatin (ZOCOR) 20 MG tablet Take 20 mg by mouth every evening.   Yes Historical Provider, MD  doxycycline (VIBRAMYCIN) 100 MG capsule Take 1 capsule (100 mg total) by mouth 2 (two) times daily. 10/03/15   Oakes Mccready, PA-C  guaiFENesin (ROBITUSSIN) 100 MG/5ML liquid Take 10 mLs (200 mg total) by mouth  every 4 (four) hours as needed for cough. 10/03/15   Jasyn Mey, PA-C  predniSONE (DELTASONE) 20 MG tablet Take 2 tablets (40 mg total) by mouth daily. For 5 days 10/03/15   Radley Barto, PA-C   BP 173/79 mmHg  Pulse 81  Temp(Src) 98.9 F (37.2 C) (Oral)  Resp 20  Ht 5\' 6"  (1.676 m)  Wt 72.576 kg  BMI 25.84 kg/m2  SpO2 96% Physical Exam  Constitutional: He is oriented to person,  place, and time. He appears well-developed and well-nourished. No distress.  HENT:  Head: Normocephalic and atraumatic.  Right Ear: Tympanic membrane and ear canal normal.  Left Ear: Tympanic membrane and ear canal normal.  Mouth/Throat: Uvula is midline, oropharynx is clear and moist and mucous membranes are normal. No oropharyngeal exudate.  Eyes: EOM are normal. Pupils are equal, round, and reactive to light.  Neck: Normal range of motion, full passive range of motion without pain and phonation normal. Neck supple.  Cardiovascular: Normal rate, regular rhythm, normal heart sounds and intact distal pulses.   No murmur heard. Pulmonary/Chest: Effort normal. No stridor. No respiratory distress. He has no rales. He exhibits no tenderness.  Coarse lungs sounds bilaterally with few expiratory wheezes  Abdominal: Soft. He exhibits no distension. There is no tenderness. There is no rebound and no guarding.  Musculoskeletal: Normal range of motion. He exhibits no edema.  Lymphadenopathy:    He has no cervical adenopathy.  Neurological: He is alert and oriented to person, place, and time. He exhibits normal muscle tone. Coordination normal.  Skin: Skin is warm and dry.  Nursing note and vitals reviewed.   ED Course  Procedures (including critical care time) Labs Review Labs Reviewed  CBC WITH DIFFERENTIAL/PLATELET - Abnormal; Notable for the following:    Monocytes Absolute 1.2 (*)    All other components within normal limits  BASIC METABOLIC PANEL - Abnormal; Notable for the following:    Glucose, Bld 226 (*)    All other components within normal limits    Imaging Review Dg Chest 2 View  10/03/2015  CLINICAL DATA:  Productive cough for the past 2 weeks. Intermittent fever and chills. EXAM: CHEST  2 VIEW COMPARISON:  09/02/2014. FINDINGS: Normal sized heart. Stable elevation of the left hemidiaphragm. Small amount of left basilar scarring. Otherwise, clear lungs. Mild thoracic spine  degenerative changes. Bilateral shoulder degenerative changes. IMPRESSION: No acute abnormality. Electronically Signed   By: Claudie Revering M.D.   On: 10/03/2015 12:50    I have personally reviewed and evaluated these images and lab results as part of my medical decision-making.   EKG Interpretation None      MDM   Final diagnoses:  Bronchitis    Pt actively coughing, no rales.  Well appearing, non-toxic.  Ambulated in the dept w/o distress or hypoxia.  Pt also seen by Dr. Jeneen Rinks and care plan discussed.  Pt appears stable for d/c and agrees to prednisone, doxy and robitussin for symptomatic relief.  He also agrees to close f/u in 2-3 days for recheck and advised to return here for any worsening symptoms    Kem Parkinson, PA-C 10/11/15 2013

## 2015-11-17 NOTE — ED Provider Notes (Signed)
Pt seen by Tammy Triplett PA-C.  Medical screening examination/treatment/procedure(s) were performed by non-physician practitioner and as supervising physician I was immediately available for consultation/collaboration.   EKG Interpretation None        Tanna Furry, MD 11/17/15 2350

## 2015-12-23 LAB — BASIC METABOLIC PANEL: Glucose: 174 mg/dL

## 2016-04-08 ENCOUNTER — Emergency Department (HOSPITAL_COMMUNITY)
Admission: EM | Admit: 2016-04-08 | Discharge: 2016-04-09 | Disposition: A | Discharge status: Home / Self Care | Payer: Medicare HMO | Attending: Emergency Medicine | Admitting: Emergency Medicine

## 2016-04-08 ENCOUNTER — Encounter (HOSPITAL_COMMUNITY): Payer: Self-pay

## 2016-04-08 DIAGNOSIS — Y9389 Activity, other specified: Secondary | ICD-10-CM | POA: Insufficient documentation

## 2016-04-08 DIAGNOSIS — I10 Essential (primary) hypertension: Secondary | ICD-10-CM | POA: Insufficient documentation

## 2016-04-08 DIAGNOSIS — W57XXXA Bitten or stung by nonvenomous insect and other nonvenomous arthropods, initial encounter: Secondary | ICD-10-CM | POA: Diagnosis not present

## 2016-04-08 DIAGNOSIS — J449 Chronic obstructive pulmonary disease, unspecified: Secondary | ICD-10-CM | POA: Diagnosis not present

## 2016-04-08 DIAGNOSIS — Y999 Unspecified external cause status: Secondary | ICD-10-CM | POA: Diagnosis not present

## 2016-04-08 DIAGNOSIS — Z7984 Long term (current) use of oral hypoglycemic drugs: Secondary | ICD-10-CM | POA: Insufficient documentation

## 2016-04-08 DIAGNOSIS — J45909 Unspecified asthma, uncomplicated: Secondary | ICD-10-CM | POA: Insufficient documentation

## 2016-04-08 DIAGNOSIS — T63441A Toxic effect of venom of bees, accidental (unintentional), initial encounter: Secondary | ICD-10-CM | POA: Diagnosis not present

## 2016-04-08 DIAGNOSIS — E119 Type 2 diabetes mellitus without complications: Secondary | ICD-10-CM | POA: Diagnosis not present

## 2016-04-08 DIAGNOSIS — F17200 Nicotine dependence, unspecified, uncomplicated: Secondary | ICD-10-CM | POA: Insufficient documentation

## 2016-04-08 DIAGNOSIS — T7840XA Allergy, unspecified, initial encounter: Secondary | ICD-10-CM | POA: Diagnosis not present

## 2016-04-08 DIAGNOSIS — Z79899 Other long term (current) drug therapy: Secondary | ICD-10-CM | POA: Diagnosis not present

## 2016-04-08 DIAGNOSIS — Y929 Unspecified place or not applicable: Secondary | ICD-10-CM | POA: Diagnosis not present

## 2016-04-08 DIAGNOSIS — S61101A Unspecified open wound of right thumb with damage to nail, initial encounter: Secondary | ICD-10-CM | POA: Diagnosis not present

## 2016-04-08 DIAGNOSIS — IMO0002 Reserved for concepts with insufficient information to code with codable children: Secondary | ICD-10-CM

## 2016-04-08 MED ORDER — EPINEPHRINE 0.3 MG/0.3ML IJ SOAJ: 0.3000 mg | Freq: Once | INTRAMUSCULAR | 1 refills | Status: AC | PRN

## 2016-04-08 MED ORDER — CEPHALEXIN 500 MG PO CAPS: 500.0000 mg | ORAL_CAPSULE | Freq: Four times a day (QID) | ORAL | 0 refills | Status: DC

## 2016-04-08 MED ORDER — DIPHENHYDRAMINE HCL 50 MG/ML IJ SOLN
INTRAMUSCULAR | Status: AC
  Filled 2016-04-08: qty 1

## 2016-04-08 MED ORDER — METHYLPREDNISOLONE SODIUM SUCC 125 MG IJ SOLR
INTRAMUSCULAR | Status: AC
  Filled 2016-04-08: qty 2

## 2016-04-08 MED ORDER — FAMOTIDINE IN NACL 20-0.9 MG/50ML-% IV SOLN
20.0000 mg | Freq: Once | INTRAVENOUS | Status: AC
  Administered 2016-04-08: 20 mg via INTRAVENOUS

## 2016-04-08 MED ORDER — FAMOTIDINE IN NACL 20-0.9 MG/50ML-% IV SOLN
INTRAVENOUS | Status: AC
  Filled 2016-04-08: qty 50

## 2016-04-08 MED ORDER — PREDNISONE 20 MG PO TABS: 40.0000 mg | ORAL_TABLET | Freq: Every day | ORAL | 0 refills | Status: DC

## 2016-04-08 MED ORDER — EPINEPHRINE 0.3 MG/0.3ML IJ SOAJ
INTRAMUSCULAR | Status: AC
  Administered 2016-04-08: 0.3 mg
  Filled 2016-04-08: qty 0.3

## 2016-04-08 MED ORDER — METHYLPREDNISOLONE SODIUM SUCC 125 MG IJ SOLR
125.0000 mg | Freq: Once | INTRAMUSCULAR | Status: AC
  Administered 2016-04-08: 125 mg via INTRAVENOUS

## 2016-04-08 MED ORDER — DIPHENHYDRAMINE HCL 50 MG/ML IJ SOLN
25.0000 mg | Freq: Once | INTRAMUSCULAR | Status: AC
  Administered 2016-04-08: 25 mg via INTRAVENOUS

## 2016-04-08 NOTE — ED Notes (Signed)
Pt sleeping at this time.

## 2016-04-08 NOTE — Discharge Instructions (Signed)
Take the prescriptions as directed.  Take over the counter benadryl, as directed on packaging, as needed for itching.  If the benadryl is too sedating, take an over the counter non-sedating antihistamine such as claritin, allegra or zyrtec, as directed on packaging.  Call your regular medical doctor on Monday to schedule a follow up appointment within the next 3 days.  Return to the Emergency Department immediately sooner if worsening.

## 2016-04-08 NOTE — ED Provider Notes (Signed)
[facility] DEPT Provider Note   CSN: [id number] Arrival date & time: 04/08/16  1911     History   Chief Complaint Chief Complaint  Patient presents with  . Insect Bite    HPI Ronald Ward is a 70 y.o. male.  HPI  Pt was seen at [place]. Per pt, c/o sudden onset and resolution of one episode of "bee stings" that occurred PTA. Pt states he was moving an [facility] unit when "they flew out and stung me." Pt states he was stung on his left forearm, left upper arm, and left side of abd. Pt endorses longstanding allergy to bee stings, but has run out of his epi-pen. Pt c/o SOB and cough and states he "needs that shot now." Denies CP/palpitations, no abd pain, no N/V/D, no back pain, no near syncope.    Past Medical History:  Diagnosis Date  . Arthritis   . Asthma   . COPD (chronic obstructive pulmonary disease) ([place])   . Diabetes mellitus without complication ([place])   . Hypertension     Patient Active Problem List   Diagnosis Date Noted  . Disorder of rotator cuff of both shoulders 03/11/2014  . Cervical spondylosis without myelopathy 03/11/2014  . Arthritis, shoulder region 03/11/2014  . Pain in shoulder 03/11/2014    History reviewed. No pertinent surgical history.     Home Medications    Prior to Admission medications   Medication Sig Start Date End Date Taking? Authorizing Provider  albuterol (PROVENTIL HFA;VENTOLIN HFA) 108 (90 BASE) MCG/ACT inhaler Inhale 2 puffs into the lungs 2 (two) times daily.    Historical Provider, MD  beclomethasone (QVAR) 40 MCG/ACT inhaler Inhale 2 puffs into the lungs 2 (two) times daily.    Historical Provider, MD  doxazosin (CARDURA) 2 MG tablet Take 2 mg by mouth daily.    Historical Provider, MD  doxycycline (VIBRAMYCIN) 100 MG capsule Take 1 capsule (100 mg total) by mouth 2 (two) times daily. 10/03/15   Tammy Triplett, PA-C  enalapril (VASOTEC) 2.5 MG tablet Take 2.5 mg by mouth daily.    Historical Provider, MD  glipiZIDE (GLUCOTROL XL)  2.5 MG 24 hr tablet Take 2.5 mg by mouth daily.    Historical Provider, MD  guaiFENesin (ROBITUSSIN) 100 MG/5ML liquid Take 10 mLs (200 mg total) by mouth every 4 (four) hours as needed for cough. 10/03/15   Tammy Triplett, PA-C  HYDROcodone-acetaminophen (NORCO/VICODIN) 5-325 MG tablet Take 1 tablet by mouth 4 (four) times daily.    Historical Provider, MD  metFORMIN (GLUCOPHAGE) 1000 MG tablet Take 1,000 mg by mouth 2 (two) times daily with a meal.    Historical Provider, MD  Phenyleph-CPM-DM-Aspirin (ALKA-SELTZER PLUS COLD & COUGH) 7.03-14-09-325 MG TBEF Take 1 tablet by mouth daily as needed (for cold and flu like symptoms).    Historical Provider, MD  Phenylephrine-DM-GG-APAP (TYLENOL COLD/FLU SEVERE) 5-10-200-325 MG TABS Take 1-2 capsules by mouth daily as needed (for cold and flu like symptoms).    Historical Provider, MD  predniSONE (DELTASONE) 20 MG tablet Take 2 tablets (40 mg total) by mouth daily. For 5 days 10/03/15   Tammy Triplett, PA-C  simvastatin (ZOCOR) 20 MG tablet Take 20 mg by mouth every evening.    Historical Provider, MD    Family History History reviewed. No pertinent family history.  Social History Social History  Substance Use Topics  . Smoking status: Current Every Day Smoker  . Smokeless tobacco: Never Used  . Alcohol use No  Allergies   Bee venom; Fish allergy; and Shellfish allergy   Review of Systems Review of Systems ROS: Statement: All systems negative except as marked or noted in the HPI; Constitutional: Negative for fever and chills. ; ; Eyes: Negative for eye pain, redness and discharge. ; ; ENMT: Negative for ear pain, hoarseness, nasal congestion, sinus pressure and sore throat. ; ; Cardiovascular: Negative for chest pain, palpitations, diaphoresis, and peripheral edema. ; ; Respiratory: +SOB, cough. Negative for wheezing and stridor. ; ; Gastrointestinal: Negative for nausea, vomiting, diarrhea, abdominal pain, blood in stool, hematemesis, jaundice  and rectal bleeding. . ; ; Genitourinary: Negative for dysuria, flank pain and hematuria. ; ; Musculoskeletal: Negative for back pain and neck pain. Negative for swelling and trauma.; ; Skin: Negative for pruritus, rash, abrasions, blisters, bruising and skin lesion.; ; Neuro: Negative for headache, lightheadedness and neck stiffness. Negative for weakness, altered level of consciousness, altered mental status, extremity weakness, paresthesias, involuntary movement, seizure and syncope.       Physical Exam Updated Vital Signs Pulse 76   Temp 98.8 F (37.1 C) (Oral)   Resp 18   Ht 5\' 6"  (1.676 m)   Wt 150 lb (68 kg)   SpO2 98%   BMI 24.21 kg/m   Physical Exam 1935: Physical examination:  Nursing notes reviewed; Vital signs and O2 SAT reviewed;  Constitutional: Well developed, Well nourished, Well hydrated, In no acute distress; Head:  Normocephalic, atraumatic; Eyes: EOMI, PERRL, No scleral icterus; ENMT: Mouth and pharynx normal, Mucous membranes moist. Mouth and pharynx without lesions. No tonsillar exudates. No intra-oral edema. No submandibular or sublingual edema. No hoarse voice, no drooling, no stridor. No pain with manipulation of larynx. No trismus.; Neck: Supple, Full range of motion, No lymphadenopathy; Cardiovascular: Regular rate and rhythm, No gallop; Respiratory: Breath sounds clear & equal bilaterally, No wheezes.  Speaking full sentences with ease, Normal respiratory effort/excursion; Chest: Nontender, Movement normal; Abdomen: Soft, Nontender, Nondistended, Normal bowel sounds; Genitourinary: No CVA tenderness; Extremities: Pulses normal, No tenderness, +bee stings to left forearm, left upper arm, left side of abd with mild localized erythema and edema. No calf edema or asymmetry.; Neuro: AA&Ox3, Major CN grossly intact.  Speech clear. No gross focal motor or sensory deficits in extremities.; Skin: Color normal, Warm, Dry.   ED Treatments / Results  Labs (all labs ordered are  listed, but only abnormal results are displayed)   EKG  EKG Interpretation None       Radiology   Procedures Procedures (including critical care time)  Medications Ordered in ED Medications  famotidine (PEPCID) IVPB 20 mg premix (20 mg Intravenous New Bag/Given 04/08/16 1940)  EPINEPHrine (EPI-PEN) 0.3 mg/0.3 mL injection (0.3 mg  Given 04/08/16 1931)  methylPREDNISolone sodium succinate (SOLU-MEDROL) 125 mg/2 mL injection 125 mg (125 mg Intravenous Given 04/08/16 1939)  diphenhydrAMINE (BENADRYL) injection 25 mg (25 mg Intravenous Given 04/08/16 1940)     Initial Impression / Assessment and Plan / ED Course  I have reviewed the triage vital signs and the nursing notes.  Pertinent labs & imaging results that were available during my care of the patient were reviewed by me and considered in my medical decision making (see chart for details).  MDM Reviewed: previous chart, nursing note and vitals   1935:  Pt requesting epi-pen on arrival to ED; endorses long hx of bee sting allergy. Epi pen given. Pt immediately stated he "feels a lot better." Will dose steroid, benadryl, pepcid and continue to observe.  2330:  Pt continues to "feel better" and wants to go home now. Tx symptomatically. Pt requesting "an antibiotic" for his right thumb: states he "cut the nail too short," "it grew under the skin," and when he "pulled the nail off pus came out of the corner where the skin is." Right distal thumb is without erythema, edema, drainage; thumb tip is soft/NT, no paronychia or felon. Dx d/w pt.  Questions answered.  Verb understanding, agreeable to d/c home with outpt f/u.        Final Clinical Impressions(s) / ED Diagnoses   Final diagnoses:  None    New Prescriptions New Prescriptions   No medications on file     Francine Graven, DO 04/12/16 2213

## 2016-04-08 NOTE — ED Triage Notes (Signed)
Patient states multiple bee stings just PTA c/o shortness of breath and cough

## 2016-06-12 ENCOUNTER — Ambulatory Visit (INDEPENDENT_AMBULATORY_CARE_PROVIDER_SITE_OTHER): Payer: PPO | Admitting: Urology

## 2016-06-12 DIAGNOSIS — N5201 Erectile dysfunction due to arterial insufficiency: Secondary | ICD-10-CM | POA: Diagnosis not present

## 2016-06-12 DIAGNOSIS — N401 Enlarged prostate with lower urinary tract symptoms: Secondary | ICD-10-CM | POA: Diagnosis not present

## 2016-06-12 DIAGNOSIS — R972 Elevated prostate specific antigen [PSA]: Secondary | ICD-10-CM

## 2016-07-13 DIAGNOSIS — R972 Elevated prostate specific antigen [PSA]: Secondary | ICD-10-CM | POA: Diagnosis not present

## 2016-07-17 DIAGNOSIS — D510 Vitamin B12 deficiency anemia due to intrinsic factor deficiency: Secondary | ICD-10-CM | POA: Diagnosis not present

## 2016-07-17 DIAGNOSIS — M545 Low back pain: Secondary | ICD-10-CM | POA: Diagnosis not present

## 2016-07-17 DIAGNOSIS — J45909 Unspecified asthma, uncomplicated: Secondary | ICD-10-CM | POA: Diagnosis not present

## 2016-07-17 DIAGNOSIS — M2550 Pain in unspecified joint: Secondary | ICD-10-CM | POA: Diagnosis not present

## 2016-07-17 DIAGNOSIS — R5383 Other fatigue: Secondary | ICD-10-CM | POA: Diagnosis not present

## 2016-07-17 DIAGNOSIS — E1165 Type 2 diabetes mellitus with hyperglycemia: Secondary | ICD-10-CM | POA: Diagnosis not present

## 2016-07-17 DIAGNOSIS — E784 Other hyperlipidemia: Secondary | ICD-10-CM | POA: Diagnosis not present

## 2016-08-02 ENCOUNTER — Encounter (INDEPENDENT_AMBULATORY_CARE_PROVIDER_SITE_OTHER): Payer: Self-pay | Admitting: *Deleted

## 2016-08-02 DIAGNOSIS — J45909 Unspecified asthma, uncomplicated: Secondary | ICD-10-CM | POA: Diagnosis not present

## 2016-08-02 DIAGNOSIS — E784 Other hyperlipidemia: Secondary | ICD-10-CM | POA: Diagnosis not present

## 2016-08-02 DIAGNOSIS — M2550 Pain in unspecified joint: Secondary | ICD-10-CM | POA: Diagnosis not present

## 2016-08-02 DIAGNOSIS — M545 Low back pain: Secondary | ICD-10-CM | POA: Diagnosis not present

## 2016-08-11 DIAGNOSIS — E119 Type 2 diabetes mellitus without complications: Secondary | ICD-10-CM | POA: Diagnosis not present

## 2016-08-16 DIAGNOSIS — M545 Low back pain: Secondary | ICD-10-CM | POA: Diagnosis not present

## 2016-08-16 DIAGNOSIS — J45909 Unspecified asthma, uncomplicated: Secondary | ICD-10-CM | POA: Diagnosis not present

## 2016-08-16 DIAGNOSIS — M2550 Pain in unspecified joint: Secondary | ICD-10-CM | POA: Diagnosis not present

## 2016-08-16 DIAGNOSIS — E784 Other hyperlipidemia: Secondary | ICD-10-CM | POA: Diagnosis not present

## 2016-08-22 ENCOUNTER — Encounter (INDEPENDENT_AMBULATORY_CARE_PROVIDER_SITE_OTHER): Payer: Self-pay | Admitting: *Deleted

## 2016-08-23 ENCOUNTER — Other Ambulatory Visit (INDEPENDENT_AMBULATORY_CARE_PROVIDER_SITE_OTHER): Payer: Self-pay | Admitting: *Deleted

## 2016-08-23 DIAGNOSIS — Z800 Family history of malignant neoplasm of digestive organs: Secondary | ICD-10-CM

## 2016-09-17 DIAGNOSIS — E784 Other hyperlipidemia: Secondary | ICD-10-CM | POA: Diagnosis not present

## 2016-09-17 DIAGNOSIS — M545 Low back pain: Secondary | ICD-10-CM | POA: Diagnosis not present

## 2016-09-17 DIAGNOSIS — E1165 Type 2 diabetes mellitus with hyperglycemia: Secondary | ICD-10-CM | POA: Diagnosis not present

## 2016-09-17 DIAGNOSIS — J45909 Unspecified asthma, uncomplicated: Secondary | ICD-10-CM | POA: Diagnosis not present

## 2016-10-12 ENCOUNTER — Encounter (INDEPENDENT_AMBULATORY_CARE_PROVIDER_SITE_OTHER): Payer: Self-pay | Admitting: *Deleted

## 2016-10-12 ENCOUNTER — Telehealth (INDEPENDENT_AMBULATORY_CARE_PROVIDER_SITE_OTHER): Payer: Self-pay | Admitting: *Deleted

## 2016-10-12 NOTE — Telephone Encounter (Signed)
Patient needs trilyte 

## 2016-10-15 MED ORDER — PEG 3350-KCL-NA BICARB-NACL 420 G PO SOLR: 4000.0000 mL | Freq: Once | ORAL | 0 refills | Status: AC

## 2016-10-19 ENCOUNTER — Emergency Department (HOSPITAL_COMMUNITY): Payer: PPO

## 2016-10-19 ENCOUNTER — Emergency Department (HOSPITAL_COMMUNITY)
Admission: EM | Admit: 2016-10-19 | Discharge: 2016-10-19 | Disposition: A | Discharge status: Home / Self Care | Payer: PPO | Attending: Emergency Medicine | Admitting: Emergency Medicine

## 2016-10-19 ENCOUNTER — Encounter (HOSPITAL_COMMUNITY): Payer: Self-pay

## 2016-10-19 DIAGNOSIS — Z7984 Long term (current) use of oral hypoglycemic drugs: Secondary | ICD-10-CM | POA: Diagnosis not present

## 2016-10-19 DIAGNOSIS — J449 Chronic obstructive pulmonary disease, unspecified: Secondary | ICD-10-CM | POA: Diagnosis not present

## 2016-10-19 DIAGNOSIS — R300 Dysuria: Secondary | ICD-10-CM | POA: Insufficient documentation

## 2016-10-19 DIAGNOSIS — R310 Gross hematuria: Secondary | ICD-10-CM | POA: Insufficient documentation

## 2016-10-19 DIAGNOSIS — I10 Essential (primary) hypertension: Secondary | ICD-10-CM | POA: Insufficient documentation

## 2016-10-19 DIAGNOSIS — N4889 Other specified disorders of penis: Secondary | ICD-10-CM | POA: Insufficient documentation

## 2016-10-19 DIAGNOSIS — J45909 Unspecified asthma, uncomplicated: Secondary | ICD-10-CM | POA: Insufficient documentation

## 2016-10-19 DIAGNOSIS — E119 Type 2 diabetes mellitus without complications: Secondary | ICD-10-CM | POA: Insufficient documentation

## 2016-10-19 DIAGNOSIS — F17200 Nicotine dependence, unspecified, uncomplicated: Secondary | ICD-10-CM | POA: Diagnosis not present

## 2016-10-19 DIAGNOSIS — R319 Hematuria, unspecified: Secondary | ICD-10-CM

## 2016-10-19 DIAGNOSIS — R350 Frequency of micturition: Secondary | ICD-10-CM | POA: Diagnosis not present

## 2016-10-19 DIAGNOSIS — Z79899 Other long term (current) drug therapy: Secondary | ICD-10-CM | POA: Insufficient documentation

## 2016-10-19 LAB — URINALYSIS, ROUTINE W REFLEX MICROSCOPIC
Glucose, UA: 250 mg/dL — AB
LEUKOCYTES UA: NEGATIVE
NITRITE: NEGATIVE
PH: 5.5 (ref 5.0–8.0)
PROTEIN: 30 mg/dL — AB
Specific Gravity, Urine: >1.03 — ABNORMAL HIGH (ref 1.005–1.030)

## 2016-10-19 LAB — BASIC METABOLIC PANEL
ANION GAP: 6 (ref 5–15)
BUN: 24 mg/dL — ABNORMAL HIGH (ref 6–20)
CALCIUM: 9 mg/dL (ref 8.9–10.3)
CO2: 28 mmol/L (ref 22–32)
CREATININE: 0.98 mg/dL (ref 0.61–1.24)
Chloride: 105 mmol/L (ref 101–111)
Glucose, Bld: 99 mg/dL (ref 65–99)
Potassium: 3.7 mmol/L (ref 3.5–5.1)
SODIUM: 139 mmol/L (ref 135–145)

## 2016-10-19 LAB — CBC WITH DIFFERENTIAL/PLATELET
BASOS ABS: 0 10*3/uL (ref 0.0–0.1)
BASOS PCT: 0 %
EOS ABS: 0.1 10*3/uL (ref 0.0–0.7)
Eosinophils Relative: 1 %
HCT: 39.3 % (ref 39.0–52.0)
Hemoglobin: 13.1 g/dL (ref 13.0–17.0)
LYMPHS PCT: 35 %
Lymphs Abs: 2.2 10*3/uL (ref 0.7–4.0)
MCH: 30 pg (ref 26.0–34.0)
MCHC: 33.3 g/dL (ref 30.0–36.0)
MCV: 89.9 fL (ref 78.0–100.0)
MONO ABS: 0.7 10*3/uL (ref 0.1–1.0)
Monocytes Relative: 11 %
NEUTROS PCT: 53 %
Neutro Abs: 3.2 10*3/uL (ref 1.7–7.7)
PLATELETS: 175 10*3/uL (ref 150–400)
RBC: 4.37 MIL/uL (ref 4.22–5.81)
RDW: 13.4 % (ref 11.5–15.5)
WBC: 6.2 10*3/uL (ref 4.0–10.5)

## 2016-10-19 LAB — URINALYSIS, MICROSCOPIC (REFLEX)
SQUAMOUS EPITHELIAL / LPF: NONE SEEN
WBC UA: NONE SEEN WBC/hpf (ref 0–5)

## 2016-10-19 NOTE — ED Provider Notes (Signed)
[facility] DEPT Provider Note   CSN: [id number] Arrival date & time: 10/19/16  0543     History   Chief Complaint Chief Complaint  Patient presents with  . Hematuria    HPI Ronald Ward is a 71 y.o. male.  Patient with history of diabetes, COPD presenting with hematuria. States he had to urinate very badly but his wife had low blood sugar and he was trying to help her. To stop himself from urinating he squeezed his penis so he would not urinate. When he eventually did urinate, he states the urine initially was clear then became darker and then he saw some blood at the end of his urinary stream. He also had a sensation of burning for about 10 minutes afterwards. The toilet bowl did turn red. He currently feels back to baseline. Denies any blood thinner use. Denies any dizziness or numbness. Denies any abdominal pain or back pain. Denies any testicular pain. Notably patient did take 5 20 mg sildenafil about 1:30 AM. He states that his erection had subsided before this happened.   The history is provided by the patient and the spouse.  Hematuria  Pertinent negatives include no chest pain, no abdominal pain and no headaches.    Past Medical History:  Diagnosis Date  . Arthritis   . Asthma   . COPD (chronic obstructive pulmonary disease) ([place])   . Diabetes mellitus without complication ([place])   . Hypertension     Patient Active Problem List   Diagnosis Date Noted  . Family hx of colon cancer 08/23/2016  . Disorder of rotator cuff of both shoulders 03/11/2014  . Cervical spondylosis without myelopathy 03/11/2014  . Arthritis, shoulder region 03/11/2014  . Pain in shoulder 03/11/2014    No past surgical history on file.     Home Medications    Prior to Admission medications   Medication Sig Start Date End Date Taking? Authorizing Provider  albuterol (PROVENTIL HFA;VENTOLIN HFA) 108 (90 BASE) MCG/ACT inhaler Inhale 2 puffs into the lungs 2 (two) times daily.   Yes  Historical Provider, MD  doxazosin (CARDURA) 2 MG tablet Take 2 mg by mouth daily.   Yes Historical Provider, MD  enalapril (VASOTEC) 2.5 MG tablet Take 2.5 mg by mouth daily.   Yes Historical Provider, MD  EPINEPHrine 0.3 mg/0.3 mL IJ SOAJ injection Inject 0.3 mLs (0.3 mg total) into the muscle once as needed (for allergic reaction). 04/08/16  Yes Francine Graven, DO  glipiZIDE (GLUCOTROL XL) 2.5 MG 24 hr tablet Take 2.5 mg by mouth daily.   Yes Historical Provider, MD  HYDROcodone-acetaminophen (NORCO/VICODIN) 5-325 MG tablet Take 1 tablet by mouth 4 (four) times daily.   Yes Historical Provider, MD  metFORMIN (GLUCOPHAGE) 1000 MG tablet Take 1,000 mg by mouth 2 (two) times daily with a meal.   Yes Historical Provider, MD  sildenafil (REVATIO) 20 MG tablet Take 20 mg by mouth 3 (three) times daily.   Yes Historical Provider, MD  simvastatin (ZOCOR) 20 MG tablet Take 20 mg by mouth every evening.   Yes Historical Provider, MD  beclomethasone (QVAR) 40 MCG/ACT inhaler Inhale 2 puffs into the lungs 2 (two) times daily.    Historical Provider, MD  cephALEXin (KEFLEX) 500 MG capsule Take 1 capsule (500 mg total) by mouth 4 (four) times daily. 04/08/16   Francine Graven, DO  predniSONE (DELTASONE) 20 MG tablet Take 2 tablets (40 mg total) by mouth daily. 04/08/16   Francine Graven, DO    Family  History No family history on file.  Social History Social History  Substance Use Topics  . Smoking status: Current Every Day Smoker  . Smokeless tobacco: Never Used  . Alcohol use No     Allergies   Bee venom; Fish allergy; and Shellfish allergy   Review of Systems Review of Systems  Constitutional: Negative for activity change, appetite change and fever.  HENT: Negative for congestion and rhinorrhea.   Respiratory: Negative for cough and chest tightness.   Cardiovascular: Negative for chest pain.  Gastrointestinal: Negative for abdominal pain, nausea and vomiting.  Genitourinary: Positive for  difficulty urinating, dysuria, frequency, hematuria and penile pain. Negative for scrotal swelling and testicular pain.  Musculoskeletal: Negative for arthralgias, back pain and myalgias.  Neurological: Negative for dizziness, weakness and headaches.   A complete 10 system review of systems was obtained and all systems are negative except as noted in the HPI and PMH.    Physical Exam Updated Vital Signs BP 123/70 (BP Location: Left Arm)   Pulse 71   Temp 97.9 F (36.6 C) (Oral)   Resp 16   Ht 5\' 6"  (1.676 m)   Wt 150 lb (68 kg)   SpO2 94%   BMI 24.21 kg/m   Physical Exam  Constitutional: He is oriented to person, place, and time. He appears well-developed and well-nourished. No distress.  HENT:  Head: Normocephalic and atraumatic.  Mouth/Throat: Oropharynx is clear and moist. No oropharyngeal exudate.  Eyes: Conjunctivae and EOM are normal. Pupils are equal, round, and reactive to light.  Neck: Normal range of motion. Neck supple.  No meningismus.  Cardiovascular: Normal rate, regular rhythm, normal heart sounds and intact distal pulses.   No murmur heard. Pulmonary/Chest: Effort normal and breath sounds normal. No respiratory distress.  Abdominal: Soft. There is no tenderness. There is no rebound and no guarding.  Genitourinary:  Genitourinary Comments: Penis is circumcised. Penis is flaccid. No gross blood at meatus. Testicles are nontender.  Musculoskeletal: Normal range of motion. He exhibits no edema or tenderness.  No CVAT  Neurological: He is alert and oriented to person, place, and time. No cranial nerve deficit. He exhibits normal muscle tone. Coordination normal.  No ataxia on finger to nose bilaterally. No pronator drift. 5/5 strength throughout. CN 2-12 intact.Equal grip strength. Sensation intact.   Skin: Skin is warm.  Psychiatric: He has a normal mood and affect. His behavior is normal.  Nursing note and vitals reviewed.    ED Treatments / Results   Labs (all labs ordered are listed, but only abnormal results are displayed) Labs Reviewed  URINALYSIS, ROUTINE W REFLEX MICROSCOPIC - Abnormal; Notable for the following:       Result Value   Specific Gravity, Urine >1.030 (*)    Glucose, UA 250 (*)    Hgb urine dipstick LARGE (*)    Bilirubin Urine SMALL (*)    Ketones, ur TRACE (*)    Protein, ur 30 (*)    All other components within normal limits  BASIC METABOLIC PANEL - Abnormal; Notable for the following:    BUN 24 (*)    All other components within normal limits  URINALYSIS, MICROSCOPIC (REFLEX) - Abnormal; Notable for the following:    Bacteria, UA MANY (*)    All other components within normal limits  URINE CULTURE  CBC WITH DIFFERENTIAL/PLATELET    EKG  EKG Interpretation None       Radiology No results found.  Procedures Procedures (including critical care time)  Medications Ordered in ED Medications - No data to display   Initial Impression / Assessment and Plan / ED Course  I have reviewed the triage vital signs and the nursing notes.  Pertinent labs & imaging results that were available during my care of the patient were reviewed by me and considered in my medical decision making (see chart for details).     Episode of hematuria after squeezing penis to prevent himself from urinating. UA with blood, no infection.  No significant flank or abdominal pain. No gross blood on exam.  Small blood clot in UA sample. No blood thinner use. Hemoglobin and Cr stable.  CT pending to evaluate hematuria in elderly male. Dr. Roderic Palau to assume care. Anticipate discharge with urology followup.   Final Clinical Impressions(s) / ED Diagnoses   Final diagnoses:  Hematuria  Gross hematuria    New Prescriptions New Prescriptions   No medications on file     Ezequiel Essex, MD 10/19/16 2257

## 2016-10-19 NOTE — Discharge Instructions (Addendum)
Follow up with the urologist. Return to the ED if you develop new or worsening symptoms.

## 2016-10-19 NOTE — ED Triage Notes (Signed)
Pt states he was trying to hold his penis because he had to urinate but he was helping his wife with her low blood sugar.   When he finally got to the commode, he urinated clear but at the end noticed bright red blood.

## 2016-10-21 LAB — URINE CULTURE

## 2016-10-26 ENCOUNTER — Telehealth (INDEPENDENT_AMBULATORY_CARE_PROVIDER_SITE_OTHER): Payer: Self-pay | Admitting: *Deleted

## 2016-10-26 NOTE — Telephone Encounter (Signed)
Referring MD/PCP: dondiego   Procedure: tcs  Reason/Indication:  fam hx colon ca  Has patient had this procedure before?  no  If so, when, by whom and where?    Is there a family history of colon cancer?  Yes, brother  Who?  What age when diagnosed?    Is patient diabetic?   yes      Does patient have prosthetic heart valve or mechanical valve?  no  Do you have a pacemaker?  no  Has patient ever had endocarditis? no  Has patient had joint replacement within last 12 months?  no  Does patient tend to be constipated or take laxatives? no  Does patient have a history of alcohol/drug use?  no  Is patient on Coumadin, Plavix and/or Aspirin? yes  Medications: see epic  Allergies: nkda  Medication Adjustment per Dr Laural Golden: asa 2 days, hold diabetic medicine evening before & morning of  Procedure date & time: 11/22/16 at 930

## 2016-10-29 NOTE — Telephone Encounter (Signed)
agree

## 2016-11-10 DIAGNOSIS — E119 Type 2 diabetes mellitus without complications: Secondary | ICD-10-CM | POA: Diagnosis not present

## 2016-11-22 ENCOUNTER — Ambulatory Visit (HOSPITAL_COMMUNITY)
Admission: RE | Admit: 2016-11-22 | Discharge: 2016-11-22 | Disposition: A | Discharge status: Home / Self Care | Payer: PPO | Source: Ambulatory Visit | Attending: Internal Medicine | Admitting: Internal Medicine

## 2016-11-22 ENCOUNTER — Encounter (HOSPITAL_COMMUNITY)
Admission: RE | Disposition: A | Discharge status: Home / Self Care | Payer: Self-pay | Source: Ambulatory Visit | Attending: Internal Medicine

## 2016-11-22 ENCOUNTER — Encounter (HOSPITAL_COMMUNITY): Payer: Self-pay | Admitting: *Deleted

## 2016-11-22 DIAGNOSIS — Z79899 Other long term (current) drug therapy: Secondary | ICD-10-CM | POA: Diagnosis not present

## 2016-11-22 DIAGNOSIS — Z91030 Bee allergy status: Secondary | ICD-10-CM | POA: Diagnosis not present

## 2016-11-22 DIAGNOSIS — Z800 Family history of malignant neoplasm of digestive organs: Secondary | ICD-10-CM | POA: Insufficient documentation

## 2016-11-22 DIAGNOSIS — Z7982 Long term (current) use of aspirin: Secondary | ICD-10-CM | POA: Insufficient documentation

## 2016-11-22 DIAGNOSIS — Z1211 Encounter for screening for malignant neoplasm of colon: Secondary | ICD-10-CM | POA: Diagnosis not present

## 2016-11-22 DIAGNOSIS — J449 Chronic obstructive pulmonary disease, unspecified: Secondary | ICD-10-CM | POA: Insufficient documentation

## 2016-11-22 DIAGNOSIS — I10 Essential (primary) hypertension: Secondary | ICD-10-CM | POA: Insufficient documentation

## 2016-11-22 DIAGNOSIS — E119 Type 2 diabetes mellitus without complications: Secondary | ICD-10-CM | POA: Insufficient documentation

## 2016-11-22 DIAGNOSIS — Z7984 Long term (current) use of oral hypoglycemic drugs: Secondary | ICD-10-CM | POA: Insufficient documentation

## 2016-11-22 DIAGNOSIS — Z833 Family history of diabetes mellitus: Secondary | ICD-10-CM | POA: Insufficient documentation

## 2016-11-22 DIAGNOSIS — Z9119 Patient's noncompliance with other medical treatment and regimen: Secondary | ICD-10-CM | POA: Diagnosis not present

## 2016-11-22 DIAGNOSIS — Z809 Family history of malignant neoplasm, unspecified: Secondary | ICD-10-CM | POA: Diagnosis not present

## 2016-11-22 DIAGNOSIS — Z91013 Allergy to seafood: Secondary | ICD-10-CM | POA: Diagnosis not present

## 2016-11-22 HISTORY — PX: COLONOSCOPY: SHX5424

## 2016-11-22 LAB — GLUCOSE, CAPILLARY: Glucose-Capillary: 130 mg/dL — ABNORMAL HIGH (ref 65–99)

## 2016-11-22 SURGERY — COLONOSCOPY
Anesthesia: Moderate Sedation

## 2016-11-22 MED ORDER — MEPERIDINE HCL 50 MG/ML IJ SOLN
INTRAMUSCULAR | Status: AC
  Filled 2016-11-22: qty 1

## 2016-11-22 MED ORDER — SODIUM CHLORIDE 0.9 % IV SOLN
INTRAVENOUS | Status: DC
  Administered 2016-11-22: 09:00:00 via INTRAVENOUS

## 2016-11-22 MED ORDER — MIDAZOLAM HCL 5 MG/5ML IJ SOLN
INTRAMUSCULAR | Status: AC
  Filled 2016-11-22: qty 10

## 2016-11-22 MED ORDER — SIMETHICONE 40 MG/0.6ML PO SUSP
ORAL | Status: DC | PRN
  Administered 2016-11-22: 10:00:00

## 2016-11-22 MED ORDER — MEPERIDINE HCL 50 MG/ML IJ SOLN
INTRAMUSCULAR | Status: DC | PRN
  Administered 2016-11-22 (×2): 25 mg via INTRAVENOUS

## 2016-11-22 MED ORDER — MIDAZOLAM HCL 5 MG/5ML IJ SOLN
INTRAMUSCULAR | Status: DC | PRN
  Administered 2016-11-22 (×2): 2 mg via INTRAVENOUS

## 2016-11-22 NOTE — Discharge Instructions (Signed)
Resume usual medications and diet. No driving for 24 hours. Next colonoscopy in 5 years.     Colonoscopy, Adult, Care After This sheet gives you information about how to care for yourself after your procedure. Your doctor may also give you more specific instructions. If you have problems or questions, call your doctor. Follow these instructions at home: General instructions    For the first 24 hours after the procedure:  Do not drive or use machinery.  Do not sign important documents.  Do not drink alcohol.  Do your daily activities more slowly than normal.  Eat foods that are soft and easy to digest.  Rest often.  Take over-the-counter or prescription medicines only as told by your doctor.  It is up to you to get the results of your procedure. Ask your doctor, or the department performing the procedure, when your results will be ready. To help cramping and bloating:   Try walking around.  Put heat on your belly (abdomen) as told by your doctor. Use a heat source that your doctor recommends, such as a moist heat pack or a heating pad.  Put a towel between your skin and the heat source.  Leave the heat on for 20-30 minutes.  Remove the heat if your skin turns bright red. This is especially important if you cannot feel pain, heat, or cold. You can get burned. Eating and drinking   Drink enough fluid to keep your pee (urine) clear or pale yellow.  Return to your normal diet as told by your doctor. Avoid heavy or fried foods that are hard to digest.  Avoid drinking alcohol for as long as told by your doctor. Contact a doctor if:  You have blood in your poop (stool) 2-3 days after the procedure. Get help right away if:  You have more than a small amount of blood in your poop.  You see large clumps of tissue (blood clots) in your poop.  Your belly is swollen.  You feel sick to your stomach (nauseous).  You throw up (vomit).  You have a fever.  You have belly  pain that gets worse, and medicine does not help your pain. This information is not intended to replace advice given to you by your health care provider. Make sure you discuss any questions you have with your health care provider. Document Released: 09/01/2010 Document Revised: 04/23/2016 Document Reviewed: 04/23/2016 Elsevier Interactive Patient Education  2017 [employer].

## 2016-11-22 NOTE — Op Note (Signed)
[facility] Patient Name: Ronald Ward Procedure Date: 11/22/2016 9:13 AM MRN: [id number] Date of Birth: 03-Aug-1946 Attending MD: Hildred Laser , MD CSN: [id number] Age: 71 Admit Type: Outpatient Procedure:                Colonoscopy Indications:              Screening in patient at increased risk: Colorectal                            cancer in brother before age 57 Providers:                Hildred Laser, MD, Otis Peak B. Sharon Seller, RN, Zoila Shutter, Technologist Referring MD:             Ralene Bathe. Dondiego, MD Medicines:                Meperidine 50 mg IV, Midazolam 4 mg IV Complications:            No immediate complications. Estimated Blood Loss:     Estimated blood loss: none. Procedure:                Pre-Anesthesia Assessment:                           - Prior to the procedure, a History and Physical                            was performed, and patient medications and                            allergies were reviewed. The patient's tolerance of                            previous anesthesia was also reviewed. The risks                            and benefits of the procedure and the sedation                            options and risks were discussed with the patient.                            All questions were answered, and informed consent                            was obtained. Prior Anticoagulants: The patient has                            taken no previous anticoagulant or antiplatelet                            agents. ASA Grade Assessment: II - A patient with  mild systemic disease. After reviewing the risks                            and benefits, the patient was deemed in                            satisfactory condition to undergo the procedure.                           After obtaining informed consent, the colonoscope                            was passed under direct vision. Throughout the               procedure, the patient's blood pressure, pulse, and                            oxygen saturations were monitored continuously. The                            EC-[id number]) scope was introduced through                            the anus and advanced to the the cecum, identified                            by ileocecal valve.appendiceal orific is covered by                            formed stool and therefore not seen. The                            colonoscopy was performed without difficulty. The                            patient tolerated the procedure well. The quality                            of the bowel preparation was inadequate. The                            ileocecal valve and the rectum were photographed. Scope In: 9:36:24 AM Scope Out: 9:55:23 AM Scope Withdrawal Time: 0 hours 11 minutes 52 seconds  Total Procedure Duration: 0 hours 18 minutes 59 seconds  Findings:      The perianal and digital rectal examinations were normal.      The colon (entire examined portion) appeared normal.      The retroflexed view of the distal rectum and anal verge was normal and       showed no anal or rectal abnormalities. Impression:               - Preparation of the colon was inadequate.                           - The  entire examined colon is normal.                           - No specimens collected. Moderate Sedation:      Moderate (conscious) sedation was administered by the endoscopy nurse       and supervised by the endoscopist. The following parameters were       monitored: oxygen saturation, heart rate, blood pressure, CO2       capnography and response to care. Total physician intraservice time was       24 minutes. Recommendation:           - Patient has a contact number available for                            emergencies. The signs and symptoms of potential                            delayed complications were discussed with the                             patient. Return to normal activities tomorrow.                            Written discharge instructions were provided to the                            patient.                           - Resume previous diet today.                           - Continue present medications.                           - Repeat colonoscopy in 5 years for screening                            purposes. Procedure Code(s):        --- Professional ---                           [id number], Colonoscopy, flexible; diagnostic, including                            collection of specimen(s) by brushing or washing,                            when performed (separate procedure)                           99152, Moderate sedation services provided by the                            same physician or other qualified health care  professional performing the diagnostic or                            therapeutic service that the sedation supports,                            requiring the presence of an independent trained                            observer to assist in the monitoring of the                            patient's level of consciousness and physiological                            status; initial 15 minutes of intraservice time,                            patient age 14 years or older                           [id number], Moderate sedation services; each additional                            15 minutes intraservice time Diagnosis Code(s):        --- Professional ---                           Z80.0, Family history of malignant neoplasm of                            digestive organs CPT copyright 2016 American Medical Association. All rights reserved. The codes documented in this report are preliminary and upon coder review may  be revised to meet current compliance requirements. Hildred Laser, MD Hildred Laser, MD 11/22/2016 10:02:38 AM This report has been signed electronically. Number of  Addenda: 0

## 2016-11-22 NOTE — H&P (Signed)
Ronald Ward is an 71 y.o. male.   Chief Complaint: Patient is here for colonoscopy. HPI: Patient is 71 year old African-American male was here for screening colonoscopy. He denies abdominal pain change in bowel habits or rectal bleeding. This is patient's first exam. History significant for CRC in brother who was possibly his 78s and had surgery. He is in his mid 74s and doing fine.  Past Medical History:  Diagnosis Date  . Arthritis   . Asthma   . COPD (chronic obstructive pulmonary disease) ([place])   . Diabetes mellitus without complication ([place])   . Hypertension     Past Surgical History:  Procedure Laterality Date  . ANKLE SURGERY    . Arm surgery    . PENILE ADHESIONS LYSIS      Family History  Problem Relation Age of Onset  . Heart attack Mother   . Diabetes Mother   . Diabetes Brother   . Cancer Brother    Social History:  reports that he has been smoking Cigarettes.  He has a 29.00 pack-year smoking history. He has never used smokeless tobacco. He reports that he does not drink alcohol or use drugs.  Allergies:  Allergies  Allergen Reactions  . Bee Venom Itching, Swelling and Other (See Comments)    Cough  . Fish Allergy Itching, Swelling and Other (See Comments)    Cough  . Shellfish Allergy Itching, Swelling and Other (See Comments)    Cough    Medications Prior to Admission  Medication Sig Dispense Refill  . albuterol (PROVENTIL HFA;VENTOLIN HFA) 108 (90 BASE) MCG/ACT inhaler Inhale 2 puffs into the lungs every 6 (six) hours as needed for wheezing or shortness of breath.     Marland Kitchen aspirin EC 81 MG tablet Take 81 mg by mouth daily.    . beclomethasone (QVAR) 40 MCG/ACT inhaler Inhale 2 puffs into the lungs 2 (two) times daily.    Marland Kitchen doxazosin (CARDURA) 2 MG tablet Take 2 mg by mouth daily.    . enalapril (VASOTEC) 10 MG tablet Take 10 mg by mouth daily.    Marland Kitchen glipiZIDE (GLUCOTROL) 5 MG tablet Take by mouth daily before breakfast.    . hydrocortisone cream 1 %  Apply 1 application topically as needed for itching.    . metFORMIN (GLUCOPHAGE) 500 MG tablet Take 500 mg by mouth 2 (two) times daily with a meal.    . Miconazole Nitrate (ANTI-FUNGAL) 2 % AERO Apply 1 application topically 3 (three) times daily.     Marland Kitchen oxyCODONE (OXY IR/ROXICODONE) 5 MG immediate release tablet Take 1 tablet by mouth 3 (three) times daily.     . simvastatin (ZOCOR) 20 MG tablet Take 20 mg by mouth every evening.    . cephALEXin (KEFLEX) 500 MG capsule Take 1 capsule (500 mg total) by mouth 4 (four) times daily. (Patient not taking: Reported on 11/19/2016) 28 capsule 0  . EPINEPHrine 0.3 mg/0.3 mL IJ SOAJ injection Inject 0.3 mLs (0.3 mg total) into the muscle once as needed (for allergic reaction). 2 Device 1    Results for orders placed or performed during the hospital encounter of 11/22/16 (from the past 48 hour(s))  Glucose, capillary     Status: Abnormal   Collection Time: 11/22/16  8:42 AM  Result Value Ref Range   Glucose-Capillary 130 (H) 65 - 99 mg/dL   No results found.  ROS  Blood pressure (!) 143/79, pulse (!) 52, temperature 97.5 F (36.4 C), temperature source Oral, resp. rate 15,  height 5\' 6"  (1.676 m), weight 150 lb (68 kg), SpO2 100 %. Physical Exam  Constitutional: He appears well-developed and well-nourished.  HENT:  Mouth/Throat: Oropharynx is clear and moist.  Eyes: Conjunctivae are normal. No scleral icterus.  Neck: No thyromegaly present.  Cardiovascular: Normal rate, regular rhythm and normal heart sounds.   No murmur heard. Respiratory: Effort normal and breath sounds normal.  GI: Soft. He exhibits no distension and no mass. There is no tenderness.  Musculoskeletal: He exhibits no edema.  Lymphadenopathy:    He has no cervical adenopathy.  Neurological: He is alert.  Skin: Skin is warm and dry.     Assessment/Plan High risk screening colonoscopy.  Hildred Laser, MD 11/22/2016, 9:27 AM

## 2016-11-26 ENCOUNTER — Encounter (HOSPITAL_COMMUNITY): Payer: Self-pay | Admitting: Internal Medicine

## 2017-02-09 DIAGNOSIS — E119 Type 2 diabetes mellitus without complications: Secondary | ICD-10-CM | POA: Diagnosis not present

## 2017-06-18 ENCOUNTER — Other Ambulatory Visit (HOSPITAL_COMMUNITY): Payer: Self-pay | Admitting: Family Medicine

## 2017-06-18 ENCOUNTER — Ambulatory Visit (HOSPITAL_COMMUNITY)
Admission: RE | Admit: 2017-06-18 | Discharge: 2017-06-18 | Disposition: A | Discharge status: Home / Self Care | Payer: PPO | Source: Ambulatory Visit | Attending: Family Medicine | Admitting: Family Medicine

## 2017-06-18 DIAGNOSIS — R9389 Abnormal findings on diagnostic imaging of other specified body structures: Secondary | ICD-10-CM | POA: Diagnosis not present

## 2017-06-18 DIAGNOSIS — M7502 Adhesive capsulitis of left shoulder: Principal | ICD-10-CM

## 2017-06-18 DIAGNOSIS — M7501 Adhesive capsulitis of right shoulder: Secondary | ICD-10-CM | POA: Diagnosis not present

## 2017-06-18 DIAGNOSIS — M19012 Primary osteoarthritis, left shoulder: Secondary | ICD-10-CM | POA: Diagnosis not present

## 2017-06-18 DIAGNOSIS — E1165 Type 2 diabetes mellitus with hyperglycemia: Secondary | ICD-10-CM | POA: Diagnosis not present

## 2017-06-18 DIAGNOSIS — J45909 Unspecified asthma, uncomplicated: Secondary | ICD-10-CM | POA: Diagnosis not present

## 2017-06-18 DIAGNOSIS — M542 Cervicalgia: Secondary | ICD-10-CM | POA: Diagnosis not present

## 2017-06-18 DIAGNOSIS — M25511 Pain in right shoulder: Secondary | ICD-10-CM | POA: Diagnosis not present

## 2017-06-18 DIAGNOSIS — M19011 Primary osteoarthritis, right shoulder: Secondary | ICD-10-CM | POA: Diagnosis not present

## 2017-06-18 DIAGNOSIS — J449 Chronic obstructive pulmonary disease, unspecified: Secondary | ICD-10-CM | POA: Diagnosis not present

## 2017-06-18 DIAGNOSIS — M2550 Pain in unspecified joint: Secondary | ICD-10-CM | POA: Diagnosis not present

## 2017-06-20 DIAGNOSIS — E1165 Type 2 diabetes mellitus with hyperglycemia: Secondary | ICD-10-CM | POA: Diagnosis not present

## 2017-06-20 DIAGNOSIS — E7849 Other hyperlipidemia: Secondary | ICD-10-CM | POA: Diagnosis not present

## 2017-06-20 DIAGNOSIS — I110 Hypertensive heart disease with heart failure: Secondary | ICD-10-CM | POA: Diagnosis not present

## 2017-06-20 DIAGNOSIS — D510 Vitamin B12 deficiency anemia due to intrinsic factor deficiency: Secondary | ICD-10-CM | POA: Diagnosis not present

## 2017-07-17 DIAGNOSIS — M2550 Pain in unspecified joint: Secondary | ICD-10-CM | POA: Diagnosis not present

## 2017-07-17 DIAGNOSIS — J45909 Unspecified asthma, uncomplicated: Secondary | ICD-10-CM | POA: Diagnosis not present

## 2017-07-17 DIAGNOSIS — E1165 Type 2 diabetes mellitus with hyperglycemia: Secondary | ICD-10-CM | POA: Diagnosis not present

## 2017-07-17 DIAGNOSIS — M545 Low back pain: Secondary | ICD-10-CM | POA: Diagnosis not present

## 2017-07-18 DIAGNOSIS — J449 Chronic obstructive pulmonary disease, unspecified: Secondary | ICD-10-CM | POA: Diagnosis not present

## 2017-07-18 DIAGNOSIS — J45909 Unspecified asthma, uncomplicated: Secondary | ICD-10-CM | POA: Diagnosis not present

## 2017-10-22 ENCOUNTER — Ambulatory Visit: Payer: Medicare HMO | Admitting: Urology

## 2017-10-22 DIAGNOSIS — N401 Enlarged prostate with lower urinary tract symptoms: Secondary | ICD-10-CM | POA: Diagnosis not present

## 2017-10-22 DIAGNOSIS — N5201 Erectile dysfunction due to arterial insufficiency: Secondary | ICD-10-CM

## 2017-10-22 DIAGNOSIS — R972 Elevated prostate specific antigen [PSA]: Secondary | ICD-10-CM | POA: Diagnosis not present

## 2017-10-22 DIAGNOSIS — R3912 Poor urinary stream: Secondary | ICD-10-CM

## 2018-04-15 ENCOUNTER — Ambulatory Visit (HOSPITAL_COMMUNITY)
Admission: RE | Admit: 2018-04-15 | Discharge: 2018-04-15 | Disposition: A | Discharge status: Home / Self Care | Payer: Medicare HMO | Source: Ambulatory Visit | Attending: Family Medicine | Admitting: Family Medicine

## 2018-04-15 ENCOUNTER — Other Ambulatory Visit (HOSPITAL_COMMUNITY): Payer: Self-pay | Admitting: Family Medicine

## 2018-04-15 DIAGNOSIS — M5136 Other intervertebral disc degeneration, lumbar region: Secondary | ICD-10-CM | POA: Insufficient documentation

## 2018-04-15 DIAGNOSIS — M1712 Unilateral primary osteoarthritis, left knee: Secondary | ICD-10-CM | POA: Insufficient documentation

## 2018-04-15 DIAGNOSIS — M1612 Unilateral primary osteoarthritis, left hip: Secondary | ICD-10-CM

## 2018-04-15 DIAGNOSIS — M47816 Spondylosis without myelopathy or radiculopathy, lumbar region: Secondary | ICD-10-CM | POA: Insufficient documentation

## 2018-04-15 DIAGNOSIS — M25552 Pain in left hip: Secondary | ICD-10-CM | POA: Insufficient documentation

## 2018-06-11 ENCOUNTER — Encounter: Payer: Self-pay | Admitting: Orthopedic Surgery

## 2018-06-11 ENCOUNTER — Ambulatory Visit (INDEPENDENT_AMBULATORY_CARE_PROVIDER_SITE_OTHER): Payer: Medicare HMO | Admitting: Orthopedic Surgery

## 2018-06-11 ENCOUNTER — Ambulatory Visit (INDEPENDENT_AMBULATORY_CARE_PROVIDER_SITE_OTHER): Payer: Medicare HMO

## 2018-06-11 VITALS — BP 124/81 | HR 67 | Ht 66.0 in | Wt 138.0 lb

## 2018-06-11 DIAGNOSIS — M5441 Lumbago with sciatica, right side: Secondary | ICD-10-CM

## 2018-06-11 DIAGNOSIS — M5442 Lumbago with sciatica, left side: Secondary | ICD-10-CM

## 2018-06-11 DIAGNOSIS — G8929 Other chronic pain: Secondary | ICD-10-CM

## 2018-06-11 MED ORDER — PREDNISONE 10 MG PO TABS: 10.0000 mg | ORAL_TABLET | Freq: Two times a day (BID) | ORAL | 0 refills | Status: DC

## 2018-06-11 NOTE — Progress Notes (Signed)
NEW PATIENT OFFICE VISIT  Chief Complaint  Patient presents with  . Back Pain    hips knees back all painful Dr Cindie Laroche told him from back      72 year old male presents for evaluation of back and hip pain which she is had for about a month.  No history of trauma.  History of bilateral leg pain when he had the back pain and then it localized to the left side currently pain-free he does have some pain in his shoulders and neck but he took some oxycodone 10 mg and it eased his back pain is currently on 5 mg  When he did have the pain it was a dull ache in the lower back left side with some radiation it was severe and caused him to have difficulty ambulating   Review of Systems  Constitutional: Negative for chills, fever, malaise/fatigue and weight loss.  Gastrointestinal: Negative for constipation.       Denies loss bowel control   Genitourinary:       Denies urinary retention or los of bladder control      Past Medical History:  Diagnosis Date  . Arthritis   . Asthma   . COPD (chronic obstructive pulmonary disease) ([place])   . Diabetes mellitus without complication ([place])   . Hypertension     Past Surgical History:  Procedure Laterality Date  . ANKLE SURGERY    . Arm surgery    . COLONOSCOPY N/A 11/22/2016   Procedure: COLONOSCOPY;  Surgeon: Rogene Houston, MD;  Location: [facility];  Service: Endoscopy;  Laterality: N/A;  930  . PENILE ADHESIONS LYSIS      Family History  Problem Relation Age of Onset  . Heart attack Mother   . Diabetes Mother   . Diabetes Brother   . Cancer Brother    Social History   Tobacco Use  . Smoking status: Current Every Day Smoker    Packs/day: 0.50    Years: 58.00    Pack years: 29.00    Types: Cigarettes  . Smokeless tobacco: Never Used  Substance Use Topics  . Alcohol use: No  . Drug use: No    Allergies  Allergen Reactions  . Bee Venom Itching, Swelling and Other (See Comments)    Cough  . Fish Allergy Itching,  Swelling and Other (See Comments)    Cough  . Shellfish Allergy Itching, Swelling and Other (See Comments)    Cough    Current Meds  Medication Sig  . albuterol (PROVENTIL HFA;VENTOLIN HFA) 108 (90 BASE) MCG/ACT inhaler Inhale 2 puffs into the lungs every 6 (six) hours as needed for wheezing or shortness of breath.   Marland Kitchen aspirin EC 81 MG tablet Take 81 mg by mouth daily.  Marland Kitchen doxazosin (CARDURA) 2 MG tablet Take 2 mg by mouth daily.  . enalapril (VASOTEC) 10 MG tablet Take 10 mg by mouth daily.  Marland Kitchen glipiZIDE (GLUCOTROL) 5 MG tablet Take by mouth daily before breakfast.  . hydrocortisone cream 1 % Apply 1 application topically as needed for itching.  . metFORMIN (GLUCOPHAGE) 500 MG tablet Take 500 mg by mouth 2 (two) times daily with a meal.  . Miconazole Nitrate (ANTI-FUNGAL) 2 % AERO Apply 1 application topically 3 (three) times daily.   . naproxen (NAPROSYN) 500 MG tablet Take 500 mg by mouth 2 (two) times daily with a meal.  . oxyCODONE (OXY IR/ROXICODONE) 5 MG immediate release tablet Take 1 tablet by mouth 3 (three) times daily.   Marland Kitchen  simvastatin (ZOCOR) 20 MG tablet Take 20 mg by mouth every evening.    BP 124/81   Pulse 67   Ht 5\' 6"  (1.676 m)   Wt 138 lb (62.6 kg)   BMI 22.27 kg/m    Physical Exam  Constitutional: He is oriented to person, place, and time. He appears well-developed and well-nourished.  Vital signs have been reviewed and are stable. Gen. appearance the patient is well-developed and well-nourished with normal grooming and hygiene.   Cardiovascular:  Pulses:      Dorsalis pedis pulses are 2+ on the right side, and 2+ on the left side.       Posterior tibial pulses are 1+ on the right side, and 1+ on the left side.  Neurological: He is alert and oriented to person, place, and time. He displays no atrophy and no tremor. No cranial nerve deficit or sensory deficit. He exhibits normal muscle tone. Coordination and gait normal.  Reflex Scores:      Patellar reflexes are  2+ on the right side and 2+ on the left side.      Achilles reflexes are 1+ on the right side and 1+ on the left side. Skin: Skin is warm and dry. No erythema.  Psychiatric: He has a normal mood and affect.  Vitals reviewed.   Back Exam   Tenderness  The patient is experiencing tenderness in the lumbar (Tenderness midline L4-S1 to).     Normal hip range of motion which was pain-free and full both hips were stable.  Surrounding hip musculature was normal.  Skin normal in both hips.  No palpable tenderness either hip.  Distally his cardiovascular system showed no edema or swelling or temperature changes in either leg   MEDICAL DECISION SECTION  Xrays were done at [facility]  My independent reading of xrays:  Hip x-rays look great no arthritis  Number films were done in the office: Diffuse spondylosis possible spondylolysis L4-L5  Encounter Diagnosis  Name Primary?  . Chronic bilateral low back pain with bilateral sciatica Yes    PLAN: (Rx., injectx, surgery, frx, mri/ct) NO INTERVENTION NEEDED AT THIS TIME  FU AS NEEDED  HE CAN TAKE PREDNISONE IF NEEDED FOR PAIN INSTEAD OF OXYCODONE   No orders of the defined types were placed in this encounter.   Arther Abbott, MD  06/11/2018 3:41 PM

## 2018-10-30 ENCOUNTER — Other Ambulatory Visit: Payer: Self-pay

## 2018-10-30 ENCOUNTER — Emergency Department (HOSPITAL_COMMUNITY): Payer: Medicare HMO

## 2018-10-30 ENCOUNTER — Emergency Department (HOSPITAL_COMMUNITY)
Admission: EM | Admit: 2018-10-30 | Discharge: 2018-10-30 | Disposition: A | Discharge status: Home / Self Care | Payer: Medicare HMO | Attending: Emergency Medicine | Admitting: Emergency Medicine

## 2018-10-30 ENCOUNTER — Encounter (HOSPITAL_COMMUNITY): Payer: Self-pay

## 2018-10-30 DIAGNOSIS — Z7984 Long term (current) use of oral hypoglycemic drugs: Secondary | ICD-10-CM | POA: Insufficient documentation

## 2018-10-30 DIAGNOSIS — E119 Type 2 diabetes mellitus without complications: Secondary | ICD-10-CM | POA: Diagnosis not present

## 2018-10-30 DIAGNOSIS — Z87891 Personal history of nicotine dependence: Secondary | ICD-10-CM | POA: Diagnosis not present

## 2018-10-30 DIAGNOSIS — Z7982 Long term (current) use of aspirin: Secondary | ICD-10-CM | POA: Diagnosis not present

## 2018-10-30 DIAGNOSIS — I10 Essential (primary) hypertension: Secondary | ICD-10-CM | POA: Insufficient documentation

## 2018-10-30 DIAGNOSIS — J209 Acute bronchitis, unspecified: Secondary | ICD-10-CM | POA: Insufficient documentation

## 2018-10-30 DIAGNOSIS — R05 Cough: Secondary | ICD-10-CM | POA: Diagnosis present

## 2018-10-30 DIAGNOSIS — J449 Chronic obstructive pulmonary disease, unspecified: Secondary | ICD-10-CM | POA: Diagnosis not present

## 2018-10-30 DIAGNOSIS — Z79899 Other long term (current) drug therapy: Secondary | ICD-10-CM | POA: Insufficient documentation

## 2018-10-30 LAB — URINALYSIS, ROUTINE W REFLEX MICROSCOPIC
Bacteria, UA: NONE SEEN
Bilirubin Urine: NEGATIVE
Glucose, UA: 150 mg/dL — AB
Ketones, ur: NEGATIVE mg/dL
Leukocytes,Ua: NEGATIVE
NITRITE: NEGATIVE
Protein, ur: NEGATIVE mg/dL
Specific Gravity, Urine: 1.023 (ref 1.005–1.030)
pH: 5 (ref 5.0–8.0)

## 2018-10-30 LAB — CBC WITH DIFFERENTIAL/PLATELET
ABS IMMATURE GRANULOCYTES: 0.02 10*3/uL (ref 0.00–0.07)
BASOS PCT: 0 %
Basophils Absolute: 0 10*3/uL (ref 0.0–0.1)
Eosinophils Absolute: 0.1 10*3/uL (ref 0.0–0.5)
Eosinophils Relative: 1 %
HCT: 37.6 % — ABNORMAL LOW (ref 39.0–52.0)
Hemoglobin: 12 g/dL — ABNORMAL LOW (ref 13.0–17.0)
IMMATURE GRANULOCYTES: 0 %
LYMPHS PCT: 28 %
Lymphs Abs: 2.2 10*3/uL (ref 0.7–4.0)
MCH: 30 pg (ref 26.0–34.0)
MCHC: 31.9 g/dL (ref 30.0–36.0)
MCV: 94 fL (ref 80.0–100.0)
Monocytes Absolute: 0.7 10*3/uL (ref 0.1–1.0)
Monocytes Relative: 9 %
NEUTROS ABS: 5 10*3/uL (ref 1.7–7.7)
Neutrophils Relative %: 62 %
PLATELETS: 242 10*3/uL (ref 150–400)
RBC: 4 MIL/uL — AB (ref 4.22–5.81)
RDW: 12.8 % (ref 11.5–15.5)
WBC: 8.1 10*3/uL (ref 4.0–10.5)
nRBC: 0 % (ref 0.0–0.2)

## 2018-10-30 LAB — COMPREHENSIVE METABOLIC PANEL
ALBUMIN: 3.3 g/dL — AB (ref 3.5–5.0)
ALT: 11 U/L (ref 0–44)
AST: 17 U/L (ref 15–41)
Alkaline Phosphatase: 69 U/L (ref 38–126)
Anion gap: 8 (ref 5–15)
BUN: 17 mg/dL (ref 8–23)
CALCIUM: 8.9 mg/dL (ref 8.9–10.3)
CO2: 28 mmol/L (ref 22–32)
Chloride: 104 mmol/L (ref 98–111)
Creatinine, Ser: 0.77 mg/dL (ref 0.61–1.24)
GFR calc non Af Amer: >60 mL/min (ref >60)
Glucose, Bld: 143 mg/dL — ABNORMAL HIGH (ref 70–99)
POTASSIUM: 3.8 mmol/L (ref 3.5–5.1)
SODIUM: 140 mmol/L (ref 135–145)
Total Bilirubin: 0.4 mg/dL (ref 0.3–1.2)
Total Protein: 6.6 g/dL (ref 6.5–8.1)

## 2018-10-30 LAB — INFLUENZA PANEL BY PCR (TYPE A & B)
Influenza A By PCR: NEGATIVE
Influenza B By PCR: NEGATIVE

## 2018-10-30 MED ORDER — DOXYCYCLINE HYCLATE 100 MG PO CAPS: 100.0000 mg | ORAL_CAPSULE | Freq: Two times a day (BID) | ORAL | 0 refills | Status: DC

## 2018-10-30 MED ORDER — ALBUTEROL SULFATE HFA 108 (90 BASE) MCG/ACT IN AERS
2.0000 | INHALATION_SPRAY | Freq: Once | RESPIRATORY_TRACT | Status: AC
  Administered 2018-10-30: 2 via RESPIRATORY_TRACT
  Filled 2018-10-30: qty 6.7

## 2018-10-30 MED ORDER — PREDNISONE 50 MG PO TABS
60.0000 mg | ORAL_TABLET | Freq: Once | ORAL | Status: AC
  Administered 2018-10-30: 60 mg via ORAL
  Filled 2018-10-30: qty 1

## 2018-10-30 MED ORDER — DOXYCYCLINE HYCLATE 100 MG PO TABS
100.0000 mg | ORAL_TABLET | Freq: Once | ORAL | Status: AC
  Administered 2018-10-30: 100 mg via ORAL
  Filled 2018-10-30: qty 1

## 2018-10-30 MED ORDER — PREDNISONE 10 MG PO TABS: ORAL_TABLET | ORAL | 0 refills | Status: DC

## 2018-10-30 NOTE — ED Triage Notes (Signed)
Pt complains of a productive cough (yellow sputum) that started last night. Pt states that he has no fever, no body aches, but his throat is a bit sore from coughing a lot.

## 2018-10-30 NOTE — Discharge Instructions (Signed)
See your Physician for recheck next week  °

## 2018-10-30 NOTE — ED Provider Notes (Signed)
[facility] EMERGENCY DEPARTMENT Provider Note   CSN: [id number] Arrival date & time: 10/30/18  5643    History   Chief Complaint Chief Complaint  Patient presents with  . Cough    HPI Ronald Ward is a 73 y.o. male.     The history is provided by the patient. No language interpreter was used.  Cough  Cough characteristics:  Productive Sputum characteristics:  Nondescript Severity:  Moderate Duration:  2 days Timing:  Constant Progression:  Worsening Chronicity:  New Smoker: no   Relieved by:  Nothing Worsened by:  Nothing Ineffective treatments:  None tried Associated symptoms: shortness of breath   Pt reports his wife was in the hospital last week with pneumonia.  Pt reports he has COPD and is now coughing up green phelgm   Past Medical History:  Diagnosis Date  . Arthritis   . Asthma   . COPD (chronic obstructive pulmonary disease) ([place])   . Diabetes mellitus without complication ([place])   . Hypertension     Patient Active Problem List   Diagnosis Date Noted  . Family hx of colon cancer 08/23/2016  . Disorder of rotator cuff of both shoulders 03/11/2014  . Cervical spondylosis without myelopathy 03/11/2014  . Arthritis, shoulder region 03/11/2014  . Pain in shoulder 03/11/2014    Past Surgical History:  Procedure Laterality Date  . ANKLE SURGERY    . Arm surgery    . COLONOSCOPY N/A 11/22/2016   Procedure: COLONOSCOPY;  Surgeon: Rogene Houston, MD;  Location: [facility];  Service: Endoscopy;  Laterality: N/A;  930  . PENILE ADHESIONS LYSIS          Home Medications    Prior to Admission medications   Medication Sig Start Date End Date Taking? Authorizing Provider  albuterol (PROVENTIL HFA;VENTOLIN HFA) 108 (90 BASE) MCG/ACT inhaler Inhale 2 puffs into the lungs every 6 (six) hours as needed for wheezing or shortness of breath.    Yes [provider]  beclomethasone (QVAR) 40 MCG/ACT inhaler Inhale 2 puffs into the lungs 2 (two)  times daily.   Yes [provider]  doxazosin (CARDURA) 2 MG tablet Take 2 mg by mouth daily.   Yes [provider]  enalapril (VASOTEC) 2.5 MG tablet Take 1 tablet by mouth daily. 08/29/18  Yes [provider]  EPINEPHrine 0.3 mg/0.3 mL IJ SOAJ injection Inject 0.3 mLs (0.3 mg total) into the muscle once as needed (for allergic reaction). 04/08/16  Yes Francine Graven, DO  hydrocortisone cream 1 % Apply 1 application topically as needed for itching.   Yes [provider]  metFORMIN (GLUCOPHAGE) 500 MG tablet Take 500 mg by mouth 2 (two) times daily with a meal.   Yes [provider]  Miconazole Nitrate (ANTI-FUNGAL) 2 % AERO Apply 1 application topically 3 (three) times daily.    Yes [provider]  naproxen (NAPROSYN) 500 MG tablet Take 500 mg by mouth 2 (two) times daily with a meal.   Yes [provider]  omega-3 acid ethyl esters (LOVAZA) 1 g capsule Take 1 capsule by mouth 4 (four) times daily. 09/20/18  Yes [provider]  oxyCODONE (OXY IR/ROXICODONE) 5 MG immediate release tablet Take 1 tablet by mouth 3 (three) times daily.  09/17/16  Yes [provider]  simvastatin (ZOCOR) 20 MG tablet Take 20 mg by mouth every evening.   Yes [provider]  varenicline (CHANTIX) 1 MG tablet Take 1 mg by mouth 2 (  two) times daily.   Yes [provider]  aspirin EC 81 MG tablet Take 81 mg by mouth daily.    [provider]  doxycycline (VIBRAMYCIN) 100 MG capsule Take 1 capsule (100 mg total) by mouth 2 (two) times daily. 10/30/18   Fransico Meadow, PA-C  predniSONE (DELTASONE) 10 MG tablet 6,5,4,3,2,1 taper 10/30/18   Fransico Meadow, PA-C    Family History Family History  Problem Relation Age of Onset  . Heart attack Mother   . Diabetes Mother   . Diabetes Brother   . Cancer Brother     Social History Social History   Tobacco Use  . Smoking status: Former Smoker    Packs/day: 0.00     Years: 58.00    Pack years: 0.00    Last attempt to quit: 08/31/2018    Years since quitting: 0.1  . Smokeless tobacco: Never Used  Substance Use Topics  . Alcohol use: Yes    Comment: occasionally   . Drug use: No     Allergies   Bee venom; Fish allergy; and Shellfish allergy   Review of Systems Review of Systems  Respiratory: Positive for cough and shortness of breath.   All other systems reviewed and are negative.    Physical Exam Updated Vital Signs BP (!) 154/94   Pulse 65   Temp 97.9 F (36.6 C) (Oral)   Resp 18   Ht 5\' 6"  (1.676 m)   Wt 67.1 kg   SpO2 98%   BMI 23.89 kg/m   Physical Exam Vitals signs and nursing note reviewed.  Constitutional:      Appearance: He is well-developed.  HENT:     Head: Normocephalic and atraumatic.     Right Ear: Tympanic membrane normal.     Left Ear: Tympanic membrane normal.     Nose: Nose normal.     Mouth/Throat:     Mouth: Mucous membranes are moist.  Eyes:     Conjunctiva/sclera: Conjunctivae normal.  Neck:     Musculoskeletal: Neck supple.  Cardiovascular:     Rate and Rhythm: Normal rate and regular rhythm.     Pulses: Normal pulses.     Heart sounds: No murmur.  Pulmonary:     Effort: Pulmonary effort is normal. No respiratory distress.     Breath sounds: Wheezing and rhonchi present.  Abdominal:     Palpations: Abdomen is soft.     Tenderness: There is no abdominal tenderness.  Musculoskeletal: Normal range of motion.  Skin:    General: Skin is warm and dry.  Neurological:     General: No focal deficit present.     Mental Status: He is alert.  Psychiatric:        Mood and Affect: Mood normal.      ED Treatments / Results  Labs (all labs ordered are listed, but only abnormal results are displayed) Labs Reviewed  CBC WITH DIFFERENTIAL/PLATELET - Abnormal; Notable for the following components:      Result Value   RBC 4.00 (*)    Hemoglobin 12.0 (*)    HCT 37.6 (*)    All other components  within normal limits  COMPREHENSIVE METABOLIC PANEL - Abnormal; Notable for the following components:   Glucose, Bld 143 (*)    Albumin 3.3 (*)    All other components within normal limits  URINALYSIS, ROUTINE W REFLEX MICROSCOPIC - Abnormal; Notable for the following components:   Glucose, UA 150 (*)    Hgb  urine dipstick SMALL (*)    All other components within normal limits  INFLUENZA PANEL BY PCR (TYPE A & B)    EKG None  Radiology Dg Chest 2 View  Result Date: 10/30/2018 CLINICAL DATA:  Cough and shortness of breath. EXAM: CHEST - 2 VIEW COMPARISON:  Chest x-ray dated October 03, 2015. FINDINGS: The heart size and mediastinal contours are within normal limits. Normal pulmonary vascularity. Atherosclerotic calcification of the aortic arch. Mild emphysematous changes again noted. Unchanged elevation of the left hemidiaphragm and scarring in both lower lobes. No focal consolidation, pleural effusion, or pneumothorax. No acute osseous abnormality. IMPRESSION: 1.  No active cardiopulmonary disease. 2. COPD. 3.  Aortic atherosclerosis (ICD10-I70.0). Electronically Signed   By: Titus Dubin M.D.   On: 10/30/2018 10:46    Procedures Procedures (including critical care time)  Medications Ordered in ED Medications  albuterol (PROVENTIL HFA;VENTOLIN HFA) 108 (90 Base) MCG/ACT inhaler 2 puff (2 puffs Inhalation Given 10/30/18 1059)  doxycycline (VIBRA-TABS) tablet 100 mg (100 mg Oral Given 10/30/18 1145)  predniSONE (DELTASONE) tablet 60 mg (60 mg Oral Given 10/30/18 1145)     Initial Impression / Assessment and Plan / ED Course  I have reviewed the triage vital signs and the nursing notes.  Pertinent labs & imaging results that were available during my care of the patient were reviewed by me and considered in my medical decision making (see chart for details).        MDM   Pt given albuterol 2 puffs here. Pt feels better,  Influenza is negative, Chest xray no pneumonia, copd on  xray. Pt given Rx for doxycycline and prednisone.   Pt advised to use his albuterol inhaler.     Final Clinical Impressions(s) / ED Diagnoses   Final diagnoses:  Acute bronchitis, unspecified organism    ED Discharge Orders         Ordered    doxycycline (VIBRAMYCIN) 100 MG capsule  2 times daily     10/30/18 1219    predniSONE (DELTASONE) 10 MG tablet     10/30/18 1219        An After Visit Summary was printed and given to the patient.    Fransico Meadow, PA-C 10/30/18 1656    Milton Ferguson, MD 10/31/18 [phone]

## 2019-02-03 ENCOUNTER — Ambulatory Visit: Payer: Medicare HMO | Admitting: Urology

## 2019-02-11 ENCOUNTER — Other Ambulatory Visit: Payer: Self-pay

## 2019-02-11 ENCOUNTER — Other Ambulatory Visit: Payer: Medicare HMO

## 2019-02-11 DIAGNOSIS — Z20822 Contact with and (suspected) exposure to covid-19: Secondary | ICD-10-CM

## 2019-02-17 LAB — NOVEL CORONAVIRUS, NAA: SARS-CoV-2, NAA: NOT DETECTED

## 2019-02-25 ENCOUNTER — Telehealth: Payer: Self-pay | Admitting: Hematology

## 2019-02-25 NOTE — Telephone Encounter (Signed)
Pt is aware covid 19 test is negative °

## 2019-03-24 ENCOUNTER — Ambulatory Visit (INDEPENDENT_AMBULATORY_CARE_PROVIDER_SITE_OTHER): Payer: Medicare HMO | Admitting: Urology

## 2019-03-24 ENCOUNTER — Other Ambulatory Visit: Payer: Self-pay

## 2019-03-24 DIAGNOSIS — R972 Elevated prostate specific antigen [PSA]: Secondary | ICD-10-CM | POA: Diagnosis not present

## 2019-03-24 DIAGNOSIS — N5201 Erectile dysfunction due to arterial insufficiency: Secondary | ICD-10-CM | POA: Diagnosis not present

## 2019-03-24 DIAGNOSIS — N401 Enlarged prostate with lower urinary tract symptoms: Secondary | ICD-10-CM | POA: Diagnosis not present

## 2019-03-24 DIAGNOSIS — R3912 Poor urinary stream: Secondary | ICD-10-CM

## 2019-05-08 ENCOUNTER — Other Ambulatory Visit (HOSPITAL_COMMUNITY): Payer: Self-pay | Admitting: Urology

## 2019-05-08 ENCOUNTER — Other Ambulatory Visit: Payer: Self-pay | Admitting: Urology

## 2019-05-08 DIAGNOSIS — R972 Elevated prostate specific antigen [PSA]: Secondary | ICD-10-CM

## 2019-05-18 ENCOUNTER — Other Ambulatory Visit: Payer: Self-pay

## 2019-05-18 ENCOUNTER — Telehealth: Payer: Self-pay | Admitting: Orthopedic Surgery

## 2019-05-18 ENCOUNTER — Emergency Department (HOSPITAL_COMMUNITY): Payer: Medicare HMO

## 2019-05-18 ENCOUNTER — Encounter (HOSPITAL_COMMUNITY): Payer: Self-pay

## 2019-05-18 ENCOUNTER — Emergency Department (HOSPITAL_COMMUNITY)
Admission: EM | Admit: 2019-05-18 | Discharge: 2019-05-18 | Disposition: A | Discharge status: Home / Self Care | Payer: Medicare HMO | Attending: Emergency Medicine | Admitting: Emergency Medicine

## 2019-05-18 DIAGNOSIS — W3409XA Accidental discharge from other specified firearms, initial encounter: Secondary | ICD-10-CM | POA: Diagnosis not present

## 2019-05-18 DIAGNOSIS — Y92009 Unspecified place in unspecified non-institutional (private) residence as the place of occurrence of the external cause: Secondary | ICD-10-CM | POA: Insufficient documentation

## 2019-05-18 DIAGNOSIS — Z23 Encounter for immunization: Secondary | ICD-10-CM | POA: Diagnosis not present

## 2019-05-18 DIAGNOSIS — S61230A Puncture wound without foreign body of right index finger without damage to nail, initial encounter: Secondary | ICD-10-CM | POA: Diagnosis not present

## 2019-05-18 DIAGNOSIS — Z87891 Personal history of nicotine dependence: Secondary | ICD-10-CM | POA: Diagnosis not present

## 2019-05-18 DIAGNOSIS — E119 Type 2 diabetes mellitus without complications: Secondary | ICD-10-CM | POA: Insufficient documentation

## 2019-05-18 DIAGNOSIS — Z7982 Long term (current) use of aspirin: Secondary | ICD-10-CM | POA: Insufficient documentation

## 2019-05-18 DIAGNOSIS — Y999 Unspecified external cause status: Secondary | ICD-10-CM | POA: Diagnosis not present

## 2019-05-18 DIAGNOSIS — Z7984 Long term (current) use of oral hypoglycemic drugs: Secondary | ICD-10-CM | POA: Diagnosis not present

## 2019-05-18 DIAGNOSIS — Z79899 Other long term (current) drug therapy: Secondary | ICD-10-CM | POA: Diagnosis not present

## 2019-05-18 DIAGNOSIS — J449 Chronic obstructive pulmonary disease, unspecified: Secondary | ICD-10-CM | POA: Diagnosis not present

## 2019-05-18 DIAGNOSIS — W3400XA Accidental discharge from unspecified firearms or gun, initial encounter: Secondary | ICD-10-CM

## 2019-05-18 DIAGNOSIS — Y9389 Activity, other specified: Secondary | ICD-10-CM | POA: Diagnosis not present

## 2019-05-18 DIAGNOSIS — I10 Essential (primary) hypertension: Secondary | ICD-10-CM | POA: Insufficient documentation

## 2019-05-18 LAB — CBG MONITORING, ED: Glucose-Capillary: 159 mg/dL — ABNORMAL HIGH (ref 70–99)

## 2019-05-18 MED ORDER — CEFAZOLIN SODIUM-DEXTROSE 2-4 GM/100ML-% IV SOLN
2.0000 g | Freq: Once | INTRAVENOUS | Status: AC
  Administered 2019-05-18: 15:00:00 2 g via INTRAVENOUS
  Filled 2019-05-18: qty 100

## 2019-05-18 MED ORDER — OXYCODONE-ACETAMINOPHEN 5-325 MG PO TABS: 1.0000 | ORAL_TABLET | Freq: Four times a day (QID) | ORAL | 0 refills | Status: DC | PRN

## 2019-05-18 MED ORDER — CEPHALEXIN 500 MG PO CAPS: 1000.0000 mg | ORAL_CAPSULE | Freq: Two times a day (BID) | ORAL | 0 refills | Status: DC

## 2019-05-18 MED ORDER — MORPHINE SULFATE (PF) 4 MG/ML IV SOLN
4.0000 mg | Freq: Once | INTRAVENOUS | Status: AC
  Administered 2019-05-18: 15:00:00 4 mg via INTRAVENOUS
  Filled 2019-05-18: qty 1

## 2019-05-18 MED ORDER — TETANUS-DIPHTH-ACELL PERTUSSIS 5-2.5-18.5 LF-MCG/0.5 IM SUSP
0.5000 mL | Freq: Once | INTRAMUSCULAR | Status: AC
  Administered 2019-05-18: 12:00:00 0.5 mL via INTRAMUSCULAR
  Filled 2019-05-18: qty 0.5
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
## 2019-05-18 NOTE — ED Notes (Signed)
Spoke with pt after he was moved to hallway and pt/wife very upset regarding this. Was going to call [facility] to speak with them but realized she already spoke with them.

## 2019-05-18 NOTE — Telephone Encounter (Signed)
Irena Cords, PA from [facility] asks that you review this patient's chart to see if you would see him.  Vania Rea understands that you are not on call but this patient states he cannot get to a Copy in [place].  He is 73 years old and walked to the ED.  Per Inez Catalina from this office, a nurse from the ED also called regarding this.  Please review chart and call Gerald Stabs at [phone]

## 2019-05-18 NOTE — ED Notes (Signed)
AC called by ED Charge RN to speak with patient and wife regarding why patient was moved into the hallway. Explained to patient and wife that he did not require a monitor bed and that he is not required to remove any clothing. Offered privacy screens, patient and wife declined. Patient then stated he was upset because he had not eaten and is diabetic. NT checked patient's cbg. Wife stated that patient "has good insurance and should be in a room with a TV and shouldn't have to wait this long". I again explained the rationale behind moving patient into hallway and apologized that this was the case. Wife stated " That's fine we are leaving in an hour regardless. I don't have anything else to say".
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
## 2019-05-18 NOTE — ED Notes (Signed)
Pt updated on plan. States he is unable to obtain a ride to [facility] for outpatient follow up.

## 2019-05-18 NOTE — ED Notes (Signed)
Myself and PA extensively reviewed the importance of follow up with the pt and his wife. New dressing placed prior to d/c with instructions not to remove until seeing Dr. Lenon Curt tomorrow. Verbalized understanding of instructions on seeing him tomorrow.

## 2019-05-18 NOTE — Telephone Encounter (Signed)
I called and advised Irena Cords, PA of what Dr Aline Brochure had to say.  He said okay

## 2019-05-18 NOTE — Telephone Encounter (Signed)
No , this has to go to hand

## 2019-05-18 NOTE — Discharge Instructions (Addendum)
Return here as needed.  Will need to return here for any worsening in your condition.  Call the hand surgeon tomorrow to set up an appointment in his office.

## 2019-05-25 ENCOUNTER — Other Ambulatory Visit: Payer: Self-pay

## 2019-05-25 ENCOUNTER — Ambulatory Visit (HOSPITAL_COMMUNITY)
Admission: RE | Admit: 2019-05-25 | Discharge: 2019-05-25 | Disposition: A | Discharge status: Home / Self Care | Payer: Medicare HMO | Source: Ambulatory Visit | Attending: Urology | Admitting: Urology

## 2019-05-25 DIAGNOSIS — R972 Elevated prostate specific antigen [PSA]: Secondary | ICD-10-CM | POA: Diagnosis not present

## 2019-05-25 LAB — CREATININE, SERUM
Creatinine, Ser: 1.18 mg/dL (ref 0.61–1.24)
GFR calc Af Amer: >60 mL/min (ref >60)
GFR calc non Af Amer: >60 mL/min (ref >60)

## 2019-05-25 MED ORDER — GADOBUTROL 1 MMOL/ML IV SOLN
7.0000 mL | Freq: Once | INTRAVENOUS | Status: AC | PRN
  Administered 2019-05-25: 7 mL via INTRAVENOUS

## 2019-09-11 ENCOUNTER — Other Ambulatory Visit: Payer: Self-pay

## 2019-09-11 ENCOUNTER — Ambulatory Visit: Payer: Medicare Other | Attending: Internal Medicine

## 2019-09-11 DIAGNOSIS — Z20822 Contact with and (suspected) exposure to covid-19: Secondary | ICD-10-CM

## 2019-09-12 LAB — NOVEL CORONAVIRUS, NAA: SARS-CoV-2, NAA: NOT DETECTED

## 2019-09-14 ENCOUNTER — Telehealth: Payer: Self-pay | Admitting: *Deleted

## 2019-09-14 NOTE — Telephone Encounter (Signed)
Pt calling for covid results; negative, verbalizes understanding.

## 2019-09-15 ENCOUNTER — Other Ambulatory Visit: Payer: Self-pay

## 2019-10-11 ENCOUNTER — Ambulatory Visit: Payer: Medicare Other

## 2019-10-15 ENCOUNTER — Ambulatory Visit: Payer: Medicare Other | Attending: Internal Medicine

## 2019-10-15 DIAGNOSIS — Z23 Encounter for immunization: Secondary | ICD-10-CM

## 2019-10-15 NOTE — Progress Notes (Signed)
   Covid-19 Vaccination Clinic  Name:  RYKAN VITTETOE    MRN: [id number] DOB: 01-Feb-1946  10/15/2019  Mr. Brandford was observed post Covid-19 immunization for 15 minutes without incident. He was provided with Vaccine Information Sheet and instruction to access the V-Safe system.   Mr. Gantz was instructed to call 911 with any severe reactions post vaccine: Marland Kitchen Difficulty breathing  . Swelling of face and throat  . A fast heartbeat  . A bad rash all over body  . Dizziness and weakness   Immunizations Administered    Name Date Dose VIS Date Route   Pfizer COVID-19 Vaccine 10/15/2019  1:52 PM 0.3 mL 07/24/2019 Intramuscular   Manufacturer: [place]   Lot: [id number]   [employer]: [id number]

## 2019-11-10 ENCOUNTER — Ambulatory Visit: Payer: Medicare Other

## 2019-11-11 ENCOUNTER — Ambulatory Visit: Payer: Medicare Other | Attending: Internal Medicine

## 2019-11-11 DIAGNOSIS — Z23 Encounter for immunization: Secondary | ICD-10-CM

## 2019-11-11 NOTE — Progress Notes (Signed)
   Covid-19 Vaccination Clinic  Name:  Ronald Ward    MRN: [id number] DOB: 02-Jun-1946  11/11/2019  [name] was observed post Covid-19 immunization for 15 minutes without incident. He was provided with Vaccine Information Sheet and instruction to access the V-Safe system.   [name] was instructed to call 911 with any severe reactions post vaccine: Marland Kitchen Difficulty breathing  . Swelling of face and throat  . A fast heartbeat  . A bad rash all over body  . Dizziness and weakness   Immunizations Administered    Name Date Dose VIS Date Route   Pfizer COVID-19 Vaccine 11/11/2019  1:43 PM 0.3 mL 07/24/2019 Intramuscular   Manufacturer: [employer], [employer]   Lot: [id number]   [place]: [id number]

## 2019-12-16 ENCOUNTER — Other Ambulatory Visit: Payer: Self-pay

## 2019-12-16 ENCOUNTER — Ambulatory Visit: Payer: Medicare Other | Attending: Internal Medicine

## 2019-12-16 DIAGNOSIS — Z20822 Contact with and (suspected) exposure to covid-19: Secondary | ICD-10-CM

## 2019-12-17 LAB — NOVEL CORONAVIRUS, NAA: SARS-CoV-2, NAA: NOT DETECTED

## 2019-12-17 LAB — SARS-COV-2, NAA 2 DAY TAT

## 2019-12-29 ENCOUNTER — Encounter: Payer: Self-pay | Admitting: Urology

## 2019-12-29 ENCOUNTER — Other Ambulatory Visit: Payer: Self-pay

## 2019-12-29 ENCOUNTER — Ambulatory Visit (INDEPENDENT_AMBULATORY_CARE_PROVIDER_SITE_OTHER): Payer: Medicare Other | Admitting: Urology

## 2019-12-29 VITALS — BP 121/64 | HR 69 | Temp 97.2°F | Ht 66.0 in | Wt 135.0 lb

## 2019-12-29 DIAGNOSIS — N401 Enlarged prostate with lower urinary tract symptoms: Secondary | ICD-10-CM

## 2019-12-29 DIAGNOSIS — R972 Elevated prostate specific antigen [PSA]: Secondary | ICD-10-CM

## 2019-12-29 DIAGNOSIS — N138 Other obstructive and reflux uropathy: Secondary | ICD-10-CM | POA: Diagnosis not present

## 2019-12-29 LAB — POCT URINALYSIS DIPSTICK
Bilirubin, UA: NEGATIVE
Glucose, UA: NEGATIVE
Ketones, UA: NEGATIVE
Leukocytes, UA: NEGATIVE
Nitrite, UA: NEGATIVE
Protein, UA: POSITIVE — AB
Spec Grav, UA: 1.025 (ref 1.010–1.025)
Urobilinogen, UA: 0.2 E.U./dL
pH, UA: 6 (ref 5.0–8.0)

## 2019-12-29 LAB — PSA: PSA: 10.7 ng/mL — ABNORMAL HIGH (ref <=4.0)

## 2019-12-29 NOTE — Progress Notes (Signed)
H&P  Chief Complaint: BPH + Elevated PSA  History of Present Illness:   5.18.2021: Here today for follow-up. Most recent PSA was 23.9 on 12.4.2020. He reports that his PSA was measured in the last 2-3 weeks and was also high but we do not have records of this at present (his lab from December may be what he is referring to). No voiding complaints aside from fairly high baseline urgency. He denies any recent dysuria or blood per urine. PSA @ Dr Tyna Jaksch office drawn ~ 3 weeks after TRUS/Bx.  IPSS Questionnaire (AUA-7): Over the past month.   1)  How often have you had a sensation of not emptying your bladder completely after you finish urinating?  5 - Almost always  2)  How often have you had to urinate again less than two hours after you finished urinating? 5 - Almost always  3)  How often have you found you stopped and started again several times when you urinated?  1 - Less than 1 time in 5  4) How difficult have you found it to postpone urination?  5 - Almost always  5) How often have you had a weak urinary stream?  0 - Not at all  6) How often have you had to push or strain to begin urination?  0 - Not at all  7) How many times did you most typically get up to urinate from the time you went to bed until the time you got up in the morning?  3 - 3 times  Total score:  0-7 mildly symptomatic   8-19 moderately symptomatic   20-35 severely symptomatic   Total: 19 QoL: (not reported)  (below copied from [facility] records):  BPH:   Ronald Ward is a 74 year-old male established patient who is here for follow up regarding further evaluation of BPH and lower urinary tract symptoms.  The patient complains of lower urinary tract symptom(s) that include urgency and sense of incomplete emptying.   11.16.2020: He is currently on tamsulosin and has discontinued doxazosin. He is very satisfied with his urination.   PSA:  His last PSA was performed 03/24/2019. The last PSA value was 15.5.   He has  had a prostate biopsy done. Patient does not have a family history of prostate cancer.   He was originally referred in October, 2014 by Dr. Legrand Rams for evaluation and management of an elevated PSA. He was seen in September 2012 @ [facility] for a PSA that went from 3.7 in June 2011 to 5.3 in June 2012.  11.30.2012: TRUS/Bx. PSA 5.3. Prostatic volume was 38.5. PSAD 0.14. All biopsies revealed benign tissue, with foci of chronic prostatitis. At that time he was incarcerated. He was followed after that, and apparently PSA was slightly elevated, but he did not undergo a repeat biopsy. Prior to his original referral, he had a PSA drawn by Dr. Legrand Rams that was 8.  PCA 3 test at his initial visit in October 2014 was positive at 33. That revealed a 30% chance of having a positive biopsy if it was to be repeated.  2.3.2015. Repeat TRUS/Bx. Prostatic volume was approximately 70 mL. All cores were benign.  Repeat PSA levels:  8.12.2015-4.69 (14% free)  8.23.2016--8.96  11.8.2018--7.8  10.31.2019--7.9  8.11.2020--15.5   10.12.2020: MRI prostate--volume 132 ml. PIRADS 3 lesion left posterolateraal/posteromedial gland. No capsular penetration, SV/NV, bony involvement, no pelvic LA.   11.16.2020: Fusion TRUS/Bx. (1/16 cores HG/PIN, left base lateral). All 4 cores  from ROI benign.    03/24/19 06/12/18 06/20/17 04/05/15 03/24/14 08/18/13  PSA  Total PSA 15.5 ng/dl 7.9 ng/dl 7.8 ng/dl 8.96  4.69  8.20   Free PSA     0.66  1.12   % Free PSA     14  14     Past Medical History:  Diagnosis Date  . Arthritis   . Asthma   . COPD (chronic obstructive pulmonary disease) ([place])   . Diabetes mellitus without complication ([place])   . Hypertension     Past Surgical History:  Procedure Laterality Date  . ANKLE SURGERY    . Arm surgery    . COLONOSCOPY N/A 11/22/2016   Procedure: COLONOSCOPY;  Surgeon: Rogene Houston, MD;  [facility]: [facility];  Service: Endoscopy;  Laterality: N/A;  930  . PENILE  ADHESIONS LYSIS      Home Medications:  Allergies as of 12/29/2019      Reactions   Bee Venom Itching, Swelling, Other (See Comments)   Cough   Fish Allergy Itching, Swelling, Other (See Comments)   Cough   Shellfish Allergy Itching, Swelling, Other (See Comments)   Cough      Medication List       Accurate as of Dec 29, 2019 11:44 AM. If you have any questions, ask your nurse or doctor.        STOP taking these medications   beclomethasone 40 MCG/ACT inhaler Commonly known as: QVAR Stopped by: Jorja Loa, MD   cephALEXin 500 MG capsule Commonly known as: KEFLEX Stopped by: Jorja Loa, MD   doxazosin 2 MG tablet Commonly known as: CARDURA Stopped by: Jorja Loa, MD   doxycycline 100 MG capsule Commonly known as: VIBRAMYCIN Stopped by: Jorja Loa, MD   omega-3 acid ethyl esters 1 g capsule Commonly known as: LOVAZA Stopped by: Jorja Loa, MD   oxyCODONE-acetaminophen 5-325 MG tablet Commonly known as: PERCOCET/ROXICET Stopped by: Jorja Loa, MD   predniSONE 10 MG tablet Commonly known as: DELTASONE Stopped by: Jorja Loa, MD     TAKE these medications   albuterol 108 (90 Base) MCG/ACT inhaler Commonly known as: VENTOLIN HFA Inhale 2 puffs into the lungs every 6 (six) hours as needed for wheezing or shortness of breath.   Anti-Fungal 2 % Aero Generic drug: Miconazole Nitrate Apply 1 application topically 3 (three) times daily.   aspirin EC 81 MG tablet Take 81 mg by mouth daily.   cholecalciferol 25 MCG (1000 UNIT) tablet Commonly known as: VITAMIN D Take 5,000 Units by mouth daily.   enalapril 2.5 MG tablet Commonly known as: VASOTEC Take 1 tablet by mouth daily.   EPINEPHrine 0.3 mg/0.3 mL Soaj injection Commonly known as: EPI-PEN Inject 0.3 mLs (0.3 mg total) into the muscle once as needed (for allergic reaction).   hydrocortisone cream 1 % Apply 1 application topically as needed  for itching.   metFORMIN 500 MG tablet Commonly known as: GLUCOPHAGE Take 500 mg by mouth 2 (two) times daily with a meal.   naproxen 500 MG tablet Commonly known as: NAPROSYN Take 500 mg by mouth 2 (two) times daily with a meal.   ONE TOUCH ULTRA 2 w/Device Kit   oxyCODONE 5 MG immediate release tablet Commonly known as: Oxy IR/ROXICODONE Take 1 tablet by mouth 3 (three) times daily.   simvastatin 20 MG tablet Commonly known as: ZOCOR Take 20 mg by mouth every evening.   tamsulosin 0.4 MG Caps  capsule Commonly known as: FLOMAX Take 0.4 mg by mouth daily.   varenicline 1 MG tablet Commonly known as: CHANTIX Take 1 mg by mouth 2 (two) times daily.       Allergies:  Allergies  Allergen Reactions  . Bee Venom Itching, Swelling and Other (See Comments)    Cough  . Fish Allergy Itching, Swelling and Other (See Comments)    Cough  . Shellfish Allergy Itching, Swelling and Other (See Comments)    Cough    Family History  Problem Relation Age of Onset  . Heart attack Mother   . Diabetes Mother   . Diabetes Brother   . Cancer Brother     Social History:  reports that he quit smoking about 15 months ago. He smoked 0.00 packs per day for 58.00 years. He has never used smokeless tobacco. He reports current alcohol use. He reports that he does not use drugs.  ROS: Urological Symptom Review Patient is experiencing the following symptoms: Frequent urination Hard to postpone urination Review of Systems Gastrointestinal (upper)  : Nausea Gastrointestinal (lower) : Negative for lower GI symptoms Constitutional : Negative for symptoms Skin: Negative for skin symptoms Eyes: Negative for eye symptoms Ear/Nose/Throat : Negative for Ear/Nose/Throat symptoms Hematologic/Lymphatic: Negative for Hematologic/Lymphatic symptoms Cardiovascular : Negative for cardiovascular symptoms Respiratory : Cough Shortness of breath Endocrine: Negative for endocrine  symptoms Musculoskeletal: Joint pain Neurological: Negative for neurological symptoms    Physical Exam:  Vital signs in last 24 hours: BP 121/64   Pulse 69   Temp (!) 97.2 F (36.2 C)   Ht '5\' 6"'$  (1.676 m)   Wt 135 lb (61.2 kg)   BMI 21.79 kg/m  Constitutional:  Alert and oriented, No acute distress Cardiovascular: Regular rate  Respiratory: Normal respiratory effort GI:. No hernias.  Genitourinary: Normal male phallus, testes are descended bilaterally and non-tender and without masses, scrotum is normal in appearance without lesions or masses, perineum is normal on inspection. Prostate feels around 120 grams in size, no nodularity.  Lymphatic: No lymphadenopathy Neurologic: Grossly intact, no focal deficits Psychiatric: Normal mood and affect  Laboratory Data:  No results for input(s): WBC, HGB, HCT, PLT in the last 72 hours.  No results for input(s): NA, K, CL, GLUCOSE, BUN, CALCIUM, CREATININE in the last 72 hours.  Invalid input(s): CO3   Results for orders placed or performed in visit on 12/29/19 (from the past 24 hour(s))  POCT urinalysis dipstick     Status: Abnormal   Collection Time: 12/29/19 11:10 AM  Result Value Ref Range   Color, UA yellow    Clarity, UA     Glucose, UA Negative Negative   Bilirubin, UA neg    Ketones, UA neg    Spec Grav, UA 1.025 1.010 - 1.025   Blood, UA +    pH, UA 6.0 5.0 - 8.0   Protein, UA Positive (A) Negative   Urobilinogen, UA 0.2 0.2 or 1.0 E.U./dL   Nitrite, UA neg    Leukocytes, UA Negative Negative   Appearance clear    Odor      I have reviewed prior pt notes  I have reviewed notes from referring/previous physicians  I have reviewed urinalysis results  I have independently reviewed prior imaging  I have reviewed prior PSA/path results  Impression/Assessment:  His PSA was quite elevated in December but this was right after a benign MRI fusion bx.  That might explain elevated PSA.  Will recheck.   Plan:  1.  PSA today -- will forward results  2. Pending results, will plan appropriate follow-up.   3. Default OV in 6 mo w/ PSA

## 2019-12-31 ENCOUNTER — Telehealth: Payer: Self-pay

## 2019-12-31 NOTE — Telephone Encounter (Signed)
-----   Message from Franchot Gallo, MD sent at 12/30/2019  8:08 PM EDT ----- Notify pt--good news PSA down to 10.7--followup in 6 mos a scheduled ----- Message ----- From: Dorisann Frames, RN Sent: 12/30/2019   8:34 AM EDT To: Franchot Gallo, MD  Please review

## 2019-12-31 NOTE — Telephone Encounter (Signed)
LMTRC

## 2020-01-04 ENCOUNTER — Telehealth: Payer: Self-pay | Admitting: General Practice

## 2020-01-04 NOTE — Telephone Encounter (Signed)
Negative COVID results given. Patient results "NOT Detected." Caller expressed understanding. ° °

## 2020-01-06 ENCOUNTER — Telehealth: Payer: Self-pay

## 2020-01-06 NOTE — Telephone Encounter (Signed)
Pt. called no answer letter sent.

## 2020-01-25 NOTE — Progress Notes (Addendum)
Letter printed and sent via mail 01/06/20

## 2020-05-04 ENCOUNTER — Other Ambulatory Visit: Payer: Medicare Other

## 2020-05-17 ENCOUNTER — Other Ambulatory Visit: Payer: Self-pay

## 2020-05-17 ENCOUNTER — Other Ambulatory Visit: Payer: Medicare Other

## 2020-05-17 DIAGNOSIS — Z20822 Contact with and (suspected) exposure to covid-19: Secondary | ICD-10-CM

## 2020-05-18 LAB — NOVEL CORONAVIRUS, NAA: SARS-CoV-2, NAA: NOT DETECTED

## 2020-05-18 LAB — SPECIMEN STATUS REPORT

## 2020-05-18 LAB — SARS-COV-2, NAA 2 DAY TAT

## 2020-06-09 ENCOUNTER — Other Ambulatory Visit: Payer: Medicare Other

## 2020-06-09 ENCOUNTER — Other Ambulatory Visit: Payer: Self-pay

## 2020-06-09 DIAGNOSIS — R972 Elevated prostate specific antigen [PSA]: Secondary | ICD-10-CM

## 2020-06-13 ENCOUNTER — Other Ambulatory Visit: Payer: Medicare Other

## 2020-06-13 ENCOUNTER — Other Ambulatory Visit: Payer: Self-pay

## 2020-06-13 DIAGNOSIS — R972 Elevated prostate specific antigen [PSA]: Secondary | ICD-10-CM

## 2020-06-14 ENCOUNTER — Ambulatory Visit (INDEPENDENT_AMBULATORY_CARE_PROVIDER_SITE_OTHER): Payer: Medicare Other | Admitting: Urology

## 2020-06-14 ENCOUNTER — Encounter: Payer: Self-pay | Admitting: Urology

## 2020-06-14 VITALS — BP 137/69 | HR 63 | Temp 97.8°F | Ht 65.0 in | Wt 135.0 lb

## 2020-06-14 DIAGNOSIS — R972 Elevated prostate specific antigen [PSA]: Secondary | ICD-10-CM

## 2020-06-14 LAB — URINALYSIS, ROUTINE W REFLEX MICROSCOPIC
Bilirubin, UA: NEGATIVE
Glucose, UA: NEGATIVE
Ketones, UA: NEGATIVE
Nitrite, UA: NEGATIVE
Protein,UA: NEGATIVE
RBC, UA: NEGATIVE
Specific Gravity, UA: 1.02 (ref 1.005–1.030)
Urobilinogen, Ur: 0.2 mg/dL (ref 0.2–1.0)
pH, UA: 7 (ref 5.0–7.5)

## 2020-06-14 LAB — MICROSCOPIC EXAMINATION
Bacteria, UA: NONE SEEN
RBC, Urine: NONE SEEN /hpf (ref 0–2)

## 2020-06-14 LAB — PSA: Prostate Specific Ag, Serum: 13.2 ng/mL — ABNORMAL HIGH (ref 0.0–4.0)

## 2020-06-14 NOTE — Progress Notes (Signed)
Urological Symptom Review  Patient is experiencing the following symptoms: Frequent urination Stream starts and stops   Review of Systems  Gastrointestinal (upper)  : Negative for upper GI symptoms  Gastrointestinal (lower) : Negative for lower GI symptoms  Constitutional : Negative for symptoms  Skin: Negative for skin symptoms  Eyes: Negative for eye symptoms  Ear/Nose/Throat : Sinus problems  Hematologic/Lymphatic: Negative for Hematologic/Lymphatic symptoms  Cardiovascular : Negative for cardiovascular symptoms  Respiratory : Cough Shortness of breath  Endocrine: Negative for endocrine symptoms  Musculoskeletal: Joint pain  Neurological: Negative for neurological symptoms  Psychologic: Negative for psychiatric symptoms

## 2020-06-14 NOTE — Progress Notes (Signed)
H&P  Chief Complaint: Elevated PSA  History of Present Illness:   BPH 11.2.2021: Pt reports that his BPH symptoms are well-managed with tamsulosin, denies any orthostatic response, and notes that when his prescription coverage lapses, his LUTS become intolerable. Pt denies any recent gross hematuria or UTIs  PSA 11.2.2021: PSA is 13.2 and stable.  ED Pt experiencing ED symptoms but does not respond to sildenafil and denied interest in vacuum, or injection assistance.  IPSS Questionnaire (AUA-7): Over the past month.   1)  How often have you had a sensation of not emptying your bladder completely after you finish urinating?  1 - Less than 1 time in 5  2)  How often have you had to urinate again less than two hours after you finished urinating? 3 - About half the time  3)  How often have you found you stopped and started again several times when you urinated?  2 - Less than half the time  4) How difficult have you found it to postpone urination?  0 - Not at all  5) How often have you had a weak urinary stream?  0 - Not at all  6) How often have you had to push or strain to begin urination?  0 - Not at all  7) How many times did you most typically get up to urinate from the time you went to bed until the time you got up in the morning?  0 - None  Total score:  0-7 mildly symptomatic   8-19 moderately symptomatic   20-35 severely symptomatic  QOL score: 3      PSA:    He was originally referred in October, 2014 by Dr. Legrand Rams for evaluation and management of an elevated PSA. He was seen in September 2012 @ [facility] for a PSA that went from 3.7 in June 2011 to 5.3 in June 2012.  11.30.2012: TRUS/Bx. PSA 5.3. Prostatic volume was 38.5. PSAD 0.14. All biopsies revealed benign tissue, with foci of chronic prostatitis. At that time he was incarcerated. He was followed after that, and apparently PSA was slightly elevated, but he did not undergo a repeat biopsy. Prior to his  original referral, he had a PSA drawn by Dr. Legrand Rams that was 8.  PCA 3 test at his initial visit in October 2014 was positive at 35. That revealed a 30% chance of having a positive biopsy if it was to be repeated.  2.3.2015. Repeat TRUS/Bx. Prostatic volume was approximately 70 mL. All cores were benign.  Repeat PSA levels:  8.12.2015-4.69 (14% free)  8.23.2016--8.96  11.8.2018--7.8  10.31.2019--7.9  8.11.2020--15.5   10.12.2020: MRI prostate--volume 132 ml. PIRADS 3 lesion left posterolateraal/posteromedial gland. No capsular penetration, SV/NV, bony involvement, no pelvic LA.   11.16.2020: Fusion TRUS/Bx. (1/16 cores HG/PIN, left base lateral). All 4 cores from ROI benign.      03/24/19 06/12/18 06/20/17 04/05/15 03/24/14 08/18/13  PSA  Total PSA 15.5 ng/dl 7.9 ng/dl 7.8 ng/dl 8.96  4.69  8.20   Free PSA     0.66  1.12   % Free PSA     14  14      Past Medical History:  Diagnosis Date  . Arthritis   . Asthma   . COPD (chronic obstructive pulmonary disease) ([place])   . Diabetes mellitus without complication ([place])   . Hypertension     Past Surgical History:  Procedure Laterality Date  . ANKLE SURGERY    . Arm surgery    .  COLONOSCOPY N/A 11/22/2016   Procedure: COLONOSCOPY;  Surgeon: Rogene Houston, MD;  Location[facility];  Service: Endoscopy;  Laterality: N/A;  930  . PENILE ADHESIONS LYSIS      Home Medications:  Allergies as of 06/14/2020      Reactions   Bee Venom Itching, Swelling, Other (See Comments)   Cough   Fish Allergy Itching, Swelling, Other (See Comments)   Cough   Shellfish Allergy Itching, Swelling, Other (See Comments)   Cough      Medication List       Accurate as of June 14, 2020 10:19 AM. If you have any questions, ask your nurse or doctor.        albuterol 108 (90 Base) MCG/ACT inhaler Commonly known as: VENTOLIN HFA Inhale 2 puffs into the lungs every 6 (six) hours as needed for wheezing or shortness of breath.     Anti-Fungal 2 % Aero Generic drug: Miconazole Nitrate Apply 1 application topically 3 (three) times daily.   aspirin EC 81 MG tablet Take 81 mg by mouth daily.   cholecalciferol 25 MCG (1000 UNIT) tablet Commonly known as: VITAMIN D Take 5,000 Units by mouth daily.   enalapril 2.5 MG tablet Commonly known as: VASOTEC Take 1 tablet by mouth daily.   EPINEPHrine 0.3 mg/0.3 mL Soaj injection Commonly known as: EPI-PEN Inject 0.3 mLs (0.3 mg total) into the muscle once as needed (for allergic reaction).   hydrocortisone cream 1 % Apply 1 application topically as needed for itching.   metFORMIN 500 MG tablet Commonly known as: GLUCOPHAGE Take 500 mg by mouth 2 (two) times daily with a meal.   naproxen 500 MG tablet Commonly known as: NAPROSYN Take 500 mg by mouth 2 (two) times daily with a meal.   ONE TOUCH ULTRA 2 w/Device Kit   oxyCODONE 5 MG immediate release tablet Commonly known as: Oxy IR/ROXICODONE Take 1 tablet by mouth 3 (three) times daily.   simvastatin 20 MG tablet Commonly known as: ZOCOR Take 20 mg by mouth every evening.   tamsulosin 0.4 MG Caps capsule Commonly known as: FLOMAX Take 0.4 mg by mouth daily.   varenicline 1 MG tablet Commonly known as: CHANTIX Take 1 mg by mouth 2 (two) times daily.       Allergies:  Allergies  Allergen Reactions  . Bee Venom Itching, Swelling and Other (See Comments)    Cough  . Fish Allergy Itching, Swelling and Other (See Comments)    Cough  . Shellfish Allergy Itching, Swelling and Other (See Comments)    Cough    Family History  Problem Relation Age of Onset  . Heart attack Mother   . Diabetes Mother   . Diabetes Brother   . Cancer Brother     Social History:  reports that he quit smoking about 21 months ago. He smoked 0.00 packs per day for 58.00 years. He has never used smokeless tobacco. He reports current alcohol use. He reports that he does not use drugs.  ROS: A complete review of systems  was performed.  All systems are negative except for pertinent findings as noted.  Physical Exam:  Vital signs in last 24 hours: There were no vitals taken for this visit. Constitutional:  Alert and oriented, No acute distress Respiratory: Normal respiratory effort GI: Abdomen is soft, nontender, nondistended, no abdominal masses. No CVAT. No hernias Genitourinary: Normal male phallus, testes are descended bilaterally and non-tender and without masses, scrotum is normal in appearance without  lesions or masses, perineum is normal on inspection. Prostate feels about 120 grams. Neurologic: Grossly intact, no focal deficits Psychiatric: Normal mood and affect  Laboratory Data:  No results for input(s): WBC, HGB, HCT, PLT in the last 72 hours.  No results for input(s): NA, K, CL, GLUCOSE, BUN, CALCIUM, CREATININE in the last 72 hours.  Invalid input(s): CO3   Results for orders placed or performed in visit on 06/13/20 (from the past 24 hour(s))  PSA     Status: Abnormal   Collection Time: 06/13/20 11:16 AM  Result Value Ref Range   Prostate Specific Ag, Serum 13.2 (H) 0.0 - 4.0 ng/mL   Narrative   Performed at:  [address], [place], [place]  [id number] Lab Director: Rush Farmer MD, Phone:  [phone]   No results found for this or any previous visit (from the past 240 hour(s)).  I have reviewed prior pt notes  I have reviewed notes from referring/previous physicians  I have reviewed urinalysis results  I have independently reviewed prior imaging  I have reviewed prior PSA results    Impression/Assessment:  PSA: Pt PSA is elevated but his trend is stable. Pt presents minimal concern at this time.  BPH: Pt DRE shows that prostate is significantly enlarged but his LUTS are well managed with tamsulosin  ED: Pt does not require intervention at this time.  Plan:  F/U with pt in 6 months for OV, PSA, and symptom recheck.  CC: Dr. Lucia Gaskins

## 2020-11-09 ENCOUNTER — Other Ambulatory Visit: Payer: Medicare Other

## 2020-11-16 DIAGNOSIS — E7849 Other hyperlipidemia: Secondary | ICD-10-CM | POA: Diagnosis not present

## 2020-11-16 DIAGNOSIS — E1165 Type 2 diabetes mellitus with hyperglycemia: Secondary | ICD-10-CM | POA: Diagnosis not present

## 2020-11-23 DIAGNOSIS — E7849 Other hyperlipidemia: Secondary | ICD-10-CM | POA: Diagnosis not present

## 2020-11-23 DIAGNOSIS — M5459 Other low back pain: Secondary | ICD-10-CM | POA: Diagnosis not present

## 2020-12-07 ENCOUNTER — Other Ambulatory Visit: Payer: Medicare Other

## 2020-12-12 NOTE — Progress Notes (Incomplete)
History of Present Illness: *    BPH     ED Pt experiencing ED symptoms but does not respond to sildenafil and denied interest in vacuum, or injection assistance.    PSA:   He was originally referred in October, 2014 by Dr. Legrand Rams for evaluation and management of an elevated PSA. He was seen in September 2012 @ [facility] for a PSA that went from 3.7 in June 2011 to 5.3 in June 2012.  11.30.2012: TRUS/Bx. PSA 5.3. Prostatic volume was 38.5. PSAD 0.14. All biopsies revealed benign tissue, with foci of chronic prostatitis. At that time he was incarcerated. He was followed after that, and apparently PSA was slightly elevated, but he did not undergo a repeat biopsy. Prior to his original referral, he had a PSA drawn by Dr. Legrand Rams that was 8.  PCA 3 test at his initial visit in October 2014 was positive at 9. That revealed a 30% chance of having a positive biopsy if it was to be repeated.  2.3.2015. Repeat TRUS/Bx. Prostatic volume was approximately 70 mL. All cores were benign.  Repeat PSA levels:  8.12.2015-4.69 (14% free)  8.23.2016--8.96  11.8.2018--7.8  10.31.2019--7.9  8.11.2020--15.5   10.12.2020: MRI prostate--volume 132 ml. PIRADS 3 lesion left posterolateraal/posteromedial gland. No capsular penetration, SV/NV, bony involvement, no pelvic LA.   11.16.2020:Fusion TRUS/Bx.(1/16 cores HG/PIN, left base lateral). All 4 cores from ROI benign.     03/24/19 06/12/18 06/20/17 04/05/15 03/24/14 08/18/13  PSA  Total PSA 15.5 ng/dl 7.9 ng/dl 7.8 ng/dl 8.96  4.69  8.20   Free PSA     0.66  1.12   % Free PSA     14  14   11.2.2021: PSA 13.2   Past Medical History:  Diagnosis Date  . Arthritis   . Asthma   . COPD (chronic obstructive pulmonary disease) ([place])   . Diabetes mellitus without complication ([place])   . Hypertension     Past Surgical History:  Procedure Laterality Date  . ANKLE SURGERY    . Arm surgery    . COLONOSCOPY N/A  11/22/2016   Procedure: COLONOSCOPY;  Surgeon: Rogene Houston, MD;  [facility];  Service: Endoscopy;  Laterality: N/A;  930  . PENILE ADHESIONS LYSIS      Home Medications:  Allergies as of 12/13/2020      Reactions   Bee Venom Itching, Swelling, Other (See Comments)   Cough   Fish Allergy Itching, Swelling, Other (See Comments)   Cough   Shellfish Allergy Itching, Swelling, Other (See Comments)   Cough      Medication List       Accurate as of Dec 12, 2020 10:32 AM. If you have any questions, ask your nurse or doctor.        albuterol 108 (90 Base) MCG/ACT inhaler Commonly known as: VENTOLIN HFA Inhale 2 puffs into the lungs every 6 (six) hours as needed for wheezing or shortness of breath.   Anti-Fungal 2 % Aero Generic drug: Miconazole Nitrate Apply 1 application topically 3 (three) times daily.   aspirin EC 81 MG tablet Take 81 mg by mouth daily.   cholecalciferol 25 MCG (1000 UNIT) tablet Commonly known as: VITAMIN D Take 5,000 Units by mouth daily.   enalapril 2.5 MG tablet Commonly known as: VASOTEC Take 1 tablet by mouth daily.   EPINEPHrine 0.3 mg/0.3 mL Soaj injection Commonly known as: EPI-PEN Inject 0.3 mLs (0.3 mg total) into the muscle once as needed (  for allergic reaction).   hydrocortisone cream 1 % Apply 1 application topically as needed for itching.   metFORMIN 500 MG tablet Commonly known as: GLUCOPHAGE Take 500 mg by mouth 2 (two) times daily with a meal.   naproxen 500 MG tablet Commonly known as: NAPROSYN Take 500 mg by mouth 2 (two) times daily with a meal.   ONE TOUCH ULTRA 2 w/Device Kit   oxyCODONE 5 MG immediate release tablet Commonly known as: Oxy IR/ROXICODONE Take 1 tablet by mouth 3 (three) times daily.   simvastatin 20 MG tablet Commonly known as: ZOCOR Take 20 mg by mouth every evening.   tamsulosin 0.4 MG Caps capsule Commonly known as: FLOMAX Take 0.4 mg by mouth daily.   varenicline 1 MG  tablet Commonly known as: CHANTIX Take 1 mg by mouth 2 (two) times daily.       Allergies:  Allergies  Allergen Reactions  . Bee Venom Itching, Swelling and Other (See Comments)    Cough  . Fish Allergy Itching, Swelling and Other (See Comments)    Cough  . Shellfish Allergy Itching, Swelling and Other (See Comments)    Cough    Family History  Problem Relation Age of Onset  . Heart attack Mother   . Diabetes Mother   . Diabetes Brother   . Cancer Brother     Social History:  reports that he quit smoking about 2 years ago. He smoked 0.00 packs per day for 58.00 years. He has never used smokeless tobacco. He reports current alcohol use. He reports that he does not use drugs.  ROS: A complete review of systems was performed.  All systems are negative except for pertinent findings as noted.  Physical Exam:  Vital signs in last 24 hours: There were no vitals taken for this visit. Constitutional:  Alert and oriented, No acute distress Cardiovascular: Regular rate  Respiratory: Normal respiratory effort GI: Abdomen is soft, nontender, nondistended, no abdominal masses. No CVAT.  Genitourinary: Normal male phallus, testes are descended bilaterally and non-tender and without masses, scrotum is normal in appearance without lesions or masses, perineum is normal on inspection. Lymphatic: No lymphadenopathy Neurologic: Grossly intact, no focal deficits Psychiatric: Normal mood and affect   Impression/Assessment:  ***  Plan:  ***

## 2020-12-13 ENCOUNTER — Ambulatory Visit: Payer: Medicare Other | Admitting: Urology

## 2020-12-22 DIAGNOSIS — E1165 Type 2 diabetes mellitus with hyperglycemia: Secondary | ICD-10-CM | POA: Diagnosis not present

## 2020-12-22 DIAGNOSIS — I110 Hypertensive heart disease with heart failure: Secondary | ICD-10-CM | POA: Diagnosis not present

## 2020-12-28 ENCOUNTER — Emergency Department (HOSPITAL_COMMUNITY)
Admission: EM | Admit: 2020-12-28 | Discharge: 2020-12-28 | Disposition: A | Discharge status: Home / Self Care | Payer: Medicare Other | Attending: Emergency Medicine | Admitting: Emergency Medicine

## 2020-12-28 ENCOUNTER — Emergency Department (HOSPITAL_COMMUNITY): Payer: Medicare Other

## 2020-12-28 ENCOUNTER — Encounter (HOSPITAL_COMMUNITY): Payer: Self-pay

## 2020-12-28 ENCOUNTER — Other Ambulatory Visit: Payer: Self-pay

## 2020-12-28 DIAGNOSIS — E119 Type 2 diabetes mellitus without complications: Secondary | ICD-10-CM | POA: Diagnosis not present

## 2020-12-28 DIAGNOSIS — Z7982 Long term (current) use of aspirin: Secondary | ICD-10-CM | POA: Diagnosis not present

## 2020-12-28 DIAGNOSIS — J449 Chronic obstructive pulmonary disease, unspecified: Secondary | ICD-10-CM | POA: Diagnosis not present

## 2020-12-28 DIAGNOSIS — M50122 Cervical disc disorder at C5-C6 level with radiculopathy: Secondary | ICD-10-CM | POA: Diagnosis not present

## 2020-12-28 DIAGNOSIS — M436 Torticollis: Secondary | ICD-10-CM | POA: Diagnosis not present

## 2020-12-28 DIAGNOSIS — Z7984 Long term (current) use of oral hypoglycemic drugs: Secondary | ICD-10-CM | POA: Diagnosis not present

## 2020-12-28 DIAGNOSIS — M4722 Other spondylosis with radiculopathy, cervical region: Secondary | ICD-10-CM | POA: Diagnosis not present

## 2020-12-28 DIAGNOSIS — J45909 Unspecified asthma, uncomplicated: Secondary | ICD-10-CM | POA: Insufficient documentation

## 2020-12-28 DIAGNOSIS — M542 Cervicalgia: Secondary | ICD-10-CM | POA: Diagnosis present

## 2020-12-28 DIAGNOSIS — M4802 Spinal stenosis, cervical region: Secondary | ICD-10-CM | POA: Diagnosis not present

## 2020-12-28 DIAGNOSIS — Z87891 Personal history of nicotine dependence: Secondary | ICD-10-CM | POA: Diagnosis not present

## 2020-12-28 DIAGNOSIS — J432 Centrilobular emphysema: Secondary | ICD-10-CM | POA: Diagnosis not present

## 2020-12-28 DIAGNOSIS — I10 Essential (primary) hypertension: Secondary | ICD-10-CM | POA: Diagnosis not present

## 2020-12-28 LAB — CBG MONITORING, ED: Glucose-Capillary: 231 mg/dL — ABNORMAL HIGH (ref 70–99)

## 2020-12-28 MED ORDER — PREDNISONE 10 MG PO TABS: ORAL_TABLET | ORAL | 0 refills | Status: DC

## 2020-12-28 NOTE — ED Triage Notes (Signed)
Pt to er, pt c/o L sided neck pain that radiates down into his back, states that he has had the pain for the past three days.  States that he had a sudden onset of pain three days ago, states that now he has a crunching noise.

## 2020-12-28 NOTE — ED Provider Notes (Signed)
  Face-to-face evaluation   History: He presents for evaluation of pain, atraumatic, left neck and left shoulder, for 3 days.  He feels like the pain started suddenly and has gotten worse.  No radiation of pain to the left arm.  He denies headache, weakness or dizziness.  There are no other known active modifying factors  Physical exam: Elderly alert male.  Mild left trapezius tenderness.  Normal range of motion neck and left shoulder.  He is lucid.  Medical screening examination/treatment/procedure(s) were conducted as a shared visit with non-physician practitioner(s) and myself.  I personally evaluated the patient during the encounter    Daleen Bo, MD 12/30/20 [phone]

## 2020-12-28 NOTE — Discharge Instructions (Addendum)
Alternate ice and heat to your neck.  Start the prescription prednisone today.  This medication may temporarily elevate your blood sugar.  You will need to closely monitor your blood sugars daily.  Discontinue the prednisone if your sugars become too elevated.  Follow-up with Dr. Cindie Laroche.  Return to emergency department for any new or worsening symptoms.

## 2020-12-28 NOTE — ED Notes (Signed)
Pt s wife says pt just drank some of her regular mountain dew.

## 2020-12-28 NOTE — ED Provider Notes (Signed)
[facility] EMERGENCY DEPARTMENT Provider Note   CSN: [id number] Arrival date & time: 12/28/20  1157     History Chief Complaint  Patient presents with  . Neck Pain    Ronald Ward is a 75 y.o. male.  HPI      Ronald Ward is a 75 y.o. male with past medical history of COPD, asthma, diabetes, and hypertension who presents to the Emergency Department complaining of left neck and shoulder pain.  Symptoms have been present for 3 days.  Pain worse today.  He states pain is worse when he turns his neck to the left or raises his left arm.  Pain radiates into his shoulder blade area.  No chest pain, arm pain, numbness or weakness of his extremities.  No headache or dizziness.  No new injury.  He took an oxycodone last evening without relief.    Past Medical History:  Diagnosis Date  . Arthritis   . Asthma   . COPD (chronic obstructive pulmonary disease) ([place])   . Diabetes mellitus without complication ([place])   . Hypertension     Patient Active Problem List   Diagnosis Date Noted  . Family hx of colon cancer 08/23/2016  . Disorder of rotator cuff of both shoulders 03/11/2014  . Cervical spondylosis without myelopathy 03/11/2014  . Arthritis, shoulder region 03/11/2014  . Pain in shoulder 03/11/2014    Past Surgical History:  Procedure Laterality Date  . ANKLE SURGERY    . Arm surgery    . COLONOSCOPY N/A 11/22/2016   Procedure: COLONOSCOPY;  Surgeon: Rogene Houston, MD;  Location: [facility] ENDO SUITE;  Service: Endoscopy;  Laterality: N/A;  930  . PENILE ADHESIONS LYSIS         Family History  Problem Relation Age of Onset  . Heart attack Mother   . Diabetes Mother   . Diabetes Brother   . Cancer Brother     Social History   Tobacco Use  . Smoking status: Former Smoker    Packs/day: 0.00    Years: 58.00    Pack years: 0.00    Quit date: 08/31/2018    Years since quitting: 2.3  . Smokeless tobacco: Never Used  Vaping Use  . Vaping Use: Never used  Substance  Use Topics  . Alcohol use: Yes    Comment: occasionally   . Drug use: No    Home Medications Prior to Admission medications   Medication Sig Start Date End Date Taking? Authorizing Provider  albuterol (PROVENTIL HFA;VENTOLIN HFA) 108 (90 BASE) MCG/ACT inhaler Inhale 2 puffs into the lungs every 6 (six) hours as needed for wheezing or shortness of breath.     [provider]  aspirin EC 81 MG tablet Take 81 mg by mouth daily.    [provider]  Blood Glucose Monitoring Suppl (ONE TOUCH ULTRA 2) w/Device KIT  11/05/19   [provider]  cholecalciferol (VITAMIN D) 25 MCG (1000 UNIT) tablet Take 5,000 Units by mouth daily.    [provider]  enalapril (VASOTEC) 2.5 MG tablet Take 1 tablet by mouth daily. 08/29/18   [provider]  EPINEPHrine 0.3 mg/0.3 mL IJ SOAJ injection Inject 0.3 mLs (0.3 mg total) into the muscle once as needed (for allergic reaction). 04/08/16   Francine Graven, DO  hydrocortisone cream 1 % Apply 1 application topically as needed for itching.    [provider]  metFORMIN (GLUCOPHAGE) 500 MG tablet Take 500 mg by mouth 2 (two)  times daily with a meal.    [provider]  Miconazole Nitrate (ANTI-FUNGAL) 2 % AERO Apply 1 application topically 3 (three) times daily.     [provider]  naproxen (NAPROSYN) 500 MG tablet Take 500 mg by mouth 2 (two) times daily with a meal.    [provider]  oxyCODONE (OXY IR/ROXICODONE) 5 MG immediate release tablet Take 1 tablet by mouth 3 (three) times daily.  09/17/16   [provider]  simvastatin (ZOCOR) 20 MG tablet Take 20 mg by mouth every evening.    [provider]  tamsulosin (FLOMAX) 0.4 MG CAPS capsule Take 0.4 mg by mouth daily. 12/07/19   [provider]  varenicline (CHANTIX) 1 MG tablet Take 1 mg by mouth 2 (two) times daily.    [provider]    Allergies    Bee venom, Fish allergy, and Shellfish  allergy  Review of Systems   Review of Systems  Constitutional: Negative for chills and fever.  Respiratory: Negative for shortness of breath.   Cardiovascular: Negative for chest pain.  Gastrointestinal: Negative for abdominal pain, nausea and vomiting.  Musculoskeletal: Positive for neck pain. Negative for arthralgias, joint swelling and neck stiffness.  Skin: Negative for color change and wound.  Neurological: Negative for dizziness, syncope, speech difficulty, weakness, numbness and headaches.  Psychiatric/Behavioral: Negative for confusion.    Physical Exam Updated Vital Signs BP (!) 158/95 (BP Location: Right Arm)   Pulse 60   Temp 98 F (36.7 C)   Resp 18   Ht 5' 6" (1.676 m)   Wt 61.2 kg   SpO2 100%   BMI 21.79 kg/m   Physical Exam Vitals and nursing note reviewed.  Constitutional:      General: He is not in acute distress.    Appearance: Normal appearance.  HENT:     Head: Atraumatic.  Eyes:     Extraocular Movements: Extraocular movements intact.     Conjunctiva/sclera: Conjunctivae normal.     Pupils: Pupils are equal, round, and reactive to light.  Neck:     Comments: Tenderness to palpation along the left cervical paraspinal muscles, left trapezius and rhomboid muscles.  No midline spinal tenderness. Cardiovascular:     Rate and Rhythm: Normal rate and regular rhythm.     Pulses: Normal pulses.  Pulmonary:     Effort: Pulmonary effort is normal.     Breath sounds: Normal breath sounds.  Chest:     Chest wall: No tenderness.  Abdominal:     Palpations: Abdomen is soft.     Tenderness: There is no abdominal tenderness.  Musculoskeletal:        General: Normal range of motion.     Cervical back: Normal range of motion. Tenderness present. No rigidity.     Right lower leg: No edema.     Left lower leg: No edema.  Skin:    General: Skin is warm.     Capillary Refill: Capillary refill takes less than 2 seconds.     Findings: No erythema or rash.   Neurological:     Mental Status: He is alert.     Sensory: No sensory deficit.     Motor: No weakness.     ED Results / Procedures / Treatments   Labs (all labs ordered are listed, but only abnormal results are displayed) Labs Reviewed - No data to display  EKG None  Radiology CT Cervical Spine Wo Contrast  Result Date: 12/28/2020 CLINICAL DATA:  Cervical radiculopathy, no red flags, left-sided neck pain for 3 days EXAM: CT CERVICAL SPINE WITHOUT CONTRAST TECHNIQUE: Multidetector CT imaging of the cervical spine was performed without intravenous contrast. Multiplanar CT image reconstructions were also generated. COMPARISON:  MRI cervical spine 01/20/2014 FINDINGS: Alignment: Straightening of the normal cervical lordosis, likely due to patient positioning and/or discomfort. Skull base and vertebrae: No acute fracture. No primary bone lesion or focal pathologic process. There is evidence of prior right mandibular fixation. Soft tissues and spinal canal: No prevertebral fluid or swelling. No visible canal hematoma. Disc levels: The craniocervical junction is unremarkable. C1-C2 degenerative changes. There is multilevel degenerative disc disease, uncovertebral joint hypertrophy, and multiple posterior disc osteophyte complexes. This results in varying degrees of mild to moderate neural foraminal narrowing, right greater than left, most prominent at C5-C6 and C6-C7. Multilevel facet arthritis bilaterally, mild-to-moderate and worst on the right at C7-T1. There is mild canal narrowing at C5-C6 and C6-C7. Upper chest: Biapical pleuroparenchymal scarring and changes of centrilobular and paraseptal emphysema. Other: None IMPRESSION: No acute cervical spine fracture. Multilevel degenerative changes of the cervical spine resulting in varying degrees of mild-to-moderate neural foraminal narrowing, right greater than left, and most prominent at C5-C6 and C6-C7. Mild spinal canal narrowing at C5-C6 and C6-C7.  Electronically Signed   By: Maurine Simmering   On: 12/28/2020 16:33    Procedures Procedures   Medications Ordered in ED Medications - No data to display  ED Course  I have reviewed the triage vital signs and the nursing notes.  Pertinent labs & imaging results that were available during my care of the patient were reviewed by me and considered in my medical decision making (see chart for details).    MDM Rules/Calculators/A&P                         Patient here with 3-day history of left-sided cervical paraspinal tenderness.  Pain worse with movement of his neck.  Patient well-appearing.  Nontoxic.  No focal neurodeficits on exam.  Pain is reproducible with palpation.  We will proceed with CT C-spine.  On recheck, patient resting comfortably.  Vital signs reviewed.  CT C-spine shows multilevel degenerative changes of the cervical spine.  No acute process.  Symptoms felt to be musculoskeletal.  Feel the patient is appropriate for discharge home, also seen by Dr. Eulis Foster.  Will treat with steroid taper.  He has narcotic pain medication at home.   The patient appears reasonably screened and/or stabilized for discharge and I doubt any other medical condition or other [facility] requiring further screening, evaluation, or treatment in the ED at this time prior to discharge.  Final Clinical Impression(s) / ED Diagnoses Final diagnoses:  Torticollis, acute    Rx / DC Orders ED Discharge Orders    None       Kem Parkinson, PA-C 12/28/20 1656    Daleen Bo, MD 12/30/20 [phone]

## 2020-12-28 NOTE — ED Notes (Signed)
CBG 231 

## 2021-01-20 DIAGNOSIS — E1165 Type 2 diabetes mellitus with hyperglycemia: Secondary | ICD-10-CM | POA: Diagnosis not present

## 2021-01-20 DIAGNOSIS — M542 Cervicalgia: Secondary | ICD-10-CM | POA: Diagnosis not present

## 2021-01-20 DIAGNOSIS — E7849 Other hyperlipidemia: Secondary | ICD-10-CM | POA: Diagnosis not present

## 2021-01-20 DIAGNOSIS — M2559 Pain in other specified joint: Secondary | ICD-10-CM | POA: Diagnosis not present

## 2021-02-16 ENCOUNTER — Other Ambulatory Visit: Payer: Self-pay | Admitting: Family Medicine

## 2021-02-16 ENCOUNTER — Ambulatory Visit (HOSPITAL_COMMUNITY)
Admission: RE | Admit: 2021-02-16 | Discharge: 2021-02-16 | Disposition: A | Discharge status: Home / Self Care | Payer: Medicare Other | Source: Ambulatory Visit | Attending: Family Medicine | Admitting: Family Medicine

## 2021-02-16 ENCOUNTER — Other Ambulatory Visit (HOSPITAL_COMMUNITY): Payer: Self-pay | Admitting: Family Medicine

## 2021-02-16 ENCOUNTER — Other Ambulatory Visit: Payer: Self-pay

## 2021-02-16 DIAGNOSIS — M5459 Other low back pain: Secondary | ICD-10-CM | POA: Diagnosis not present

## 2021-02-16 DIAGNOSIS — M545 Low back pain, unspecified: Secondary | ICD-10-CM | POA: Diagnosis not present

## 2021-02-16 DIAGNOSIS — E1165 Type 2 diabetes mellitus with hyperglycemia: Secondary | ICD-10-CM | POA: Diagnosis not present

## 2021-02-16 DIAGNOSIS — E7849 Other hyperlipidemia: Secondary | ICD-10-CM | POA: Diagnosis not present

## 2021-03-28 ENCOUNTER — Other Ambulatory Visit: Payer: Self-pay

## 2021-03-28 ENCOUNTER — Ambulatory Visit (INDEPENDENT_AMBULATORY_CARE_PROVIDER_SITE_OTHER): Payer: Medicare Other | Admitting: Urology

## 2021-03-28 ENCOUNTER — Encounter: Payer: Self-pay | Admitting: Urology

## 2021-03-28 VITALS — BP 115/64 | HR 63

## 2021-03-28 DIAGNOSIS — N138 Other obstructive and reflux uropathy: Secondary | ICD-10-CM | POA: Diagnosis not present

## 2021-03-28 DIAGNOSIS — M5416 Radiculopathy, lumbar region: Secondary | ICD-10-CM | POA: Diagnosis not present

## 2021-03-28 DIAGNOSIS — R972 Elevated prostate specific antigen [PSA]: Secondary | ICD-10-CM

## 2021-03-28 DIAGNOSIS — N401 Enlarged prostate with lower urinary tract symptoms: Secondary | ICD-10-CM | POA: Diagnosis not present

## 2021-03-28 LAB — URINALYSIS, ROUTINE W REFLEX MICROSCOPIC
Bilirubin, UA: NEGATIVE
Glucose, UA: NEGATIVE
Leukocytes,UA: NEGATIVE
Nitrite, UA: NEGATIVE
Protein,UA: NEGATIVE
Specific Gravity, UA: 1.015 (ref 1.005–1.030)
Urobilinogen, Ur: 1 mg/dL (ref 0.2–1.0)
pH, UA: 6 (ref 5.0–7.5)

## 2021-03-28 LAB — MICROSCOPIC EXAMINATION
Bacteria, UA: NONE SEEN
Epithelial Cells (non renal): NONE SEEN /hpf (ref 0–10)
Renal Epithel, UA: NONE SEEN /hpf
WBC, UA: NONE SEEN /hpf (ref 0–5)

## 2021-03-28 NOTE — Progress Notes (Signed)
History of Present Illness:   BPH   8.16.2022: He is on tamsulosin.  Stable urinary symptoms.  He has had no recent gross hematuria or dysuria.    ED  He states that he can get occasional erection.  He does not want any help with this.   I         PSA:      He was originally referred in October, 2014 by Dr. Legrand Rams for evaluation and management of an elevated PSA. He was seen in September 2012 @ [facility] for a PSA that went from 3.7 in June 2011 to 5.3 in June 2012.  11.30.2012: TRUS/Bx. PSA 5.3. Prostatic volume was 38.5. PSAD 0.14. All biopsies revealed benign tissue, with foci of chronic prostatitis. At that time he was incarcerated. He was followed after that, and apparently PSA was slightly elevated, but he did not undergo a repeat biopsy. Prior to his original referral, he had a PSA drawn by Dr. Legrand Rams that was 8.  PCA 3 test at his initial visit in October 2014 was positive at 59. That revealed a 30% chance of having a positive biopsy if it was to be repeated.  2.3.2015. Repeat TRUS/Bx. Prostatic volume was approximately 70 mL. All cores were benign.  Repeat PSA levels:  8.12.2015-4.69 (14% free)  8.23.2016--8.96  11.8.2018--7.8  10.31.2019--7.9  8.11.2020--15.5    10.12.2020: MRI prostate--volume 132 ml. PIRADS 3 lesion left posterolateraal/posteromedial gland. No capsular penetration, SV/NV, bony involvement, no pelvic LA.    11.16.2020: Fusion TRUS/Bx. (1/16 cores HG/PIN, left base lateral). All 4 cores from ROI benign.             11.2.2021: PSA is 13.2 and stable.     03/24/19 06/12/18 06/20/17 04/05/15 03/24/14 08/18/13  PSA  Total PSA 15.5 ng/dl 7.9 ng/dl 7.8 ng/dl 8.96  4.69  8.20   Free PSA         0.66  1.12   % Free PSA         14  14      8.16.2022: He has had significant lower back pain with pain radiating down his left posterior leg for quite some time.  He does have weakness on that side.  He had an MRI about a month ago.  Dr. Cindie Laroche, the  doctor that ordered the test, has retired and he has not heard the results yet.  Past Medical History:  Diagnosis Date   Arthritis    Asthma    COPD (chronic obstructive pulmonary disease) ([place])    Diabetes mellitus without complication ([place])    Hypertension     Past Surgical History:  Procedure Laterality Date   ANKLE SURGERY     Arm surgery     COLONOSCOPY N/A 11/22/2016   Procedure: COLONOSCOPY;  Surgeon: Rogene Houston, MD;  Location[facility];  Service: Endoscopy;  Laterality: N/A;  [place] Medications:  Allergies as of 03/28/2021       Reactions   Bee Venom Itching, Swelling, Other (See Comments)   Cough   Fish Allergy Itching, Swelling, Other (See Comments)   Cough   Shellfish Allergy Itching, Swelling, Other (See Comments)   Cough        Medication List        Accurate as of March 28, 2021  3:46 PM. If you have any questions, ask your nurse or doctor.  albuterol 108 (90 Base) MCG/ACT inhaler Commonly known as: VENTOLIN HFA Inhale 2 puffs into the lungs every 6 (six) hours as needed for wheezing or shortness of breath.   aspirin EC 81 MG tablet Take 81 mg by mouth daily.   cholecalciferol 25 MCG (1000 UNIT) tablet Commonly known as: VITAMIN D Take 5,000 Units by mouth daily.   enalapril 2.5 MG tablet Commonly known as: VASOTEC Take 1 tablet by mouth daily.   EPINEPHrine 0.3 mg/0.3 mL Soaj injection Commonly known as: EPI-PEN Inject 0.3 mLs (0.3 mg total) into the muscle once as needed (for allergic reaction).   hydrocortisone cream 1 % Apply 1 application topically as needed for itching.   metFORMIN 500 MG tablet Commonly known as: GLUCOPHAGE Take 500 mg by mouth 2 (two) times daily with a meal.   Miconazole Nitrate 2 % Aero Apply 1 application topically 3 (three) times daily.   naproxen 500 MG tablet Commonly known as: NAPROSYN Take 500 mg by mouth 2 (two) times daily with a meal.   ONE  TOUCH ULTRA 2 w/Device Kit   oxyCODONE 5 MG immediate release tablet Commonly known as: Oxy IR/ROXICODONE Take 1 tablet by mouth 3 (three) times daily.   predniSONE 10 MG tablet Commonly known as: DELTASONE Take 6 tablets day one, 5 tablets day two, 4 tablets day three, 3 tablets day four, 2 tablets day five, then 1 tablet day six   simvastatin 20 MG tablet Commonly known as: ZOCOR Take 20 mg by mouth every evening.   tamsulosin 0.4 MG Caps capsule Commonly known as: FLOMAX Take 0.4 mg by mouth daily.   varenicline 1 MG tablet Commonly known as: CHANTIX Take 1 mg by mouth 2 (two) times daily.        Allergies:  Allergies  Allergen Reactions   Bee Venom Itching, Swelling and Other (See Comments)    Cough   Fish Allergy Itching, Swelling and Other (See Comments)    Cough   Shellfish Allergy Itching, Swelling and Other (See Comments)    Cough    Family History  Problem Relation Age of Onset   Heart attack Mother    Diabetes Mother    Diabetes Brother    Cancer Brother     Social History:  reports that he quit smoking about 2 years ago. He has never used smokeless tobacco. He reports current alcohol use. He reports that he does not use drugs.  ROS: A complete review of systems was performed.  All systems are negative except for pertinent findings as noted.  Physical Exam:  Vital signs in last 24 hours: BP 115/64   Pulse 63  Constitutional:  Alert and oriented, No acute distress Cardiovascular: Regular rate  Respiratory: Normal respiratory effort Lymphatic: No lymphadenopathy Neurologic: Grossly intact, no focal deficits Psychiatric: Normal mood and affect  I have reviewed prior pt notes  I have reviewed urinalysis results-clear today  I have independently reviewed prior imaging--MRI results IMPRESSION: 1. At L4-L5, grade 1 anterolisthesis with moderate to severe bilateral foraminal stenosis. Inferiorly dissecting left subarticular disc protrusion at  this level extends along the posterior aspect of the L5 vertebral body and displaces the descending left L4 nerve roots with possible impingement. 2. At L5-S1, moderate to severe left and moderate right foraminal stenosis.    I have reviewed prior PSA results    Impression/Assessment:  1.  BPH with symptomatic issues, better with tamsulosin, symptoms stable  2.  Elevated PSA.  Prostate volume 132 mL, last  PSA 13.2.  Negative biopsies  3.  Lumbar radiculopathy, significantly symptomatic for this patient who has nowhere to go seeing that his PCP has retired  Plan:  1.  I will make a referral to [place] [facility] for the patient to be seen for this back issue  2.  PSA will be checked today  3.  Unless significant issues, I will see him back in 1 year

## 2021-03-28 NOTE — Progress Notes (Signed)
Urological Symptom Review  Patient is experiencing the following symptoms: Hard to postpone urination Get up at night to urinate   Review of Systems  Gastrointestinal (upper)  : Negative for upper GI symptoms  Gastrointestinal (lower) : Negative for lower GI symptoms  Constitutional : Negative for symptoms  Skin: Negative for skin symptoms  Eyes: Negative for eye symptoms  Ear/Nose/Throat : Negative for Ear/Nose/Throat symptoms  Hematologic/Lymphatic: Negative for Hematologic/Lymphatic symptoms  Cardiovascular : Negative for cardiovascular symptoms  Respiratory : Negative for respiratory symptoms  Endocrine: Negative for endocrine symptoms  Musculoskeletal: Negative for musculoskeletal symptoms  Neurological: Negative for neurological symptoms  Psychologic: Negative for psychiatric symptoms

## 2021-03-29 LAB — PSA: Prostate Specific Ag, Serum: 14.8 ng/mL — ABNORMAL HIGH (ref 0.0–4.0)

## 2021-04-04 NOTE — Progress Notes (Signed)
Results mailed- phone number provided in chart would not connect

## 2021-04-05 DIAGNOSIS — I10 Essential (primary) hypertension: Secondary | ICD-10-CM | POA: Diagnosis not present

## 2021-04-05 DIAGNOSIS — F17210 Nicotine dependence, cigarettes, uncomplicated: Secondary | ICD-10-CM | POA: Diagnosis not present

## 2021-04-05 DIAGNOSIS — M5442 Lumbago with sciatica, left side: Secondary | ICD-10-CM | POA: Diagnosis not present

## 2021-04-05 DIAGNOSIS — M25512 Pain in left shoulder: Secondary | ICD-10-CM | POA: Diagnosis not present

## 2021-04-05 DIAGNOSIS — M5136 Other intervertebral disc degeneration, lumbar region: Secondary | ICD-10-CM | POA: Diagnosis not present

## 2021-04-05 DIAGNOSIS — J449 Chronic obstructive pulmonary disease, unspecified: Secondary | ICD-10-CM | POA: Diagnosis not present

## 2021-04-05 DIAGNOSIS — E119 Type 2 diabetes mellitus without complications: Secondary | ICD-10-CM | POA: Diagnosis not present

## 2021-04-05 DIAGNOSIS — Z79891 Long term (current) use of opiate analgesic: Secondary | ICD-10-CM | POA: Diagnosis not present

## 2021-04-05 DIAGNOSIS — E785 Hyperlipidemia, unspecified: Secondary | ICD-10-CM | POA: Diagnosis not present

## 2021-04-05 DIAGNOSIS — M542 Cervicalgia: Secondary | ICD-10-CM | POA: Diagnosis not present

## 2021-04-05 DIAGNOSIS — G894 Chronic pain syndrome: Secondary | ICD-10-CM | POA: Diagnosis not present

## 2021-04-05 DIAGNOSIS — M545 Low back pain, unspecified: Secondary | ICD-10-CM | POA: Diagnosis not present

## 2021-04-21 DIAGNOSIS — M542 Cervicalgia: Secondary | ICD-10-CM | POA: Diagnosis not present

## 2021-04-21 DIAGNOSIS — Z79891 Long term (current) use of opiate analgesic: Secondary | ICD-10-CM | POA: Diagnosis not present

## 2021-04-23 ENCOUNTER — Emergency Department (HOSPITAL_COMMUNITY): Payer: Medicare Other

## 2021-04-23 ENCOUNTER — Encounter (HOSPITAL_COMMUNITY): Payer: Self-pay | Admitting: *Deleted

## 2021-04-23 ENCOUNTER — Other Ambulatory Visit: Payer: Self-pay

## 2021-04-23 ENCOUNTER — Emergency Department (HOSPITAL_COMMUNITY)
Admission: EM | Admit: 2021-04-23 | Discharge: 2021-04-23 | Disposition: A | Discharge status: Home / Self Care | Payer: Medicare Other | Attending: Emergency Medicine | Admitting: Emergency Medicine

## 2021-04-23 DIAGNOSIS — J449 Chronic obstructive pulmonary disease, unspecified: Secondary | ICD-10-CM | POA: Insufficient documentation

## 2021-04-23 DIAGNOSIS — Z7984 Long term (current) use of oral hypoglycemic drugs: Secondary | ICD-10-CM | POA: Insufficient documentation

## 2021-04-23 DIAGNOSIS — E119 Type 2 diabetes mellitus without complications: Secondary | ICD-10-CM | POA: Diagnosis not present

## 2021-04-23 DIAGNOSIS — I10 Essential (primary) hypertension: Secondary | ICD-10-CM | POA: Insufficient documentation

## 2021-04-23 DIAGNOSIS — Z79899 Other long term (current) drug therapy: Secondary | ICD-10-CM | POA: Diagnosis not present

## 2021-04-23 DIAGNOSIS — J45909 Unspecified asthma, uncomplicated: Secondary | ICD-10-CM | POA: Diagnosis not present

## 2021-04-23 DIAGNOSIS — Z7982 Long term (current) use of aspirin: Secondary | ICD-10-CM | POA: Insufficient documentation

## 2021-04-23 DIAGNOSIS — Z87891 Personal history of nicotine dependence: Secondary | ICD-10-CM | POA: Insufficient documentation

## 2021-04-23 DIAGNOSIS — U071 COVID-19: Secondary | ICD-10-CM | POA: Diagnosis not present

## 2021-04-23 DIAGNOSIS — J439 Emphysema, unspecified: Secondary | ICD-10-CM | POA: Diagnosis not present

## 2021-04-23 DIAGNOSIS — R059 Cough, unspecified: Secondary | ICD-10-CM | POA: Diagnosis not present

## 2021-04-23 DIAGNOSIS — Z20822 Contact with and (suspected) exposure to covid-19: Secondary | ICD-10-CM

## 2021-04-23 DIAGNOSIS — R058 Other specified cough: Secondary | ICD-10-CM

## 2021-04-23 DIAGNOSIS — R0602 Shortness of breath: Secondary | ICD-10-CM | POA: Diagnosis not present

## 2021-04-23 LAB — RESP PANEL BY RT-PCR (FLU A&B, COVID) ARPGX2
Influenza A by PCR: NEGATIVE
Influenza B by PCR: NEGATIVE
SARS Coronavirus 2 by RT PCR: POSITIVE — AB

## 2021-04-23 MED ORDER — BENZONATATE 100 MG PO CAPS: 100.0000 mg | ORAL_CAPSULE | Freq: Three times a day (TID) | ORAL | 0 refills | Status: DC

## 2021-04-23 MED ORDER — ALBUTEROL SULFATE HFA 108 (90 BASE) MCG/ACT IN AERS: 1.0000 | INHALATION_SPRAY | Freq: Four times a day (QID) | RESPIRATORY_TRACT | 0 refills | Status: DC | PRN

## 2021-04-23 NOTE — ED Provider Notes (Signed)
[facility] EMERGENCY DEPARTMENT Provider Note   CSN: [id number] Arrival date & time: 04/23/21  0800     History Chief Complaint  Patient presents with   Cough    Ronald Ward is a 75 y.o. male.  With past medical history significant for asthma, COPD, type 2 diabetes, hypertension, tobacco use who presents to the emergency department with concern for COVID.  Symptoms began last Saturday, 04/15/2021, after exposure to wife who tested positive.  Complains of cough with yellow sputum production.  Since having cough patient has had pleuritic chest pain.  Denies aggravating or alleviating factors.  Endorses diarrhea.  Denies chest pain, shortness of breath, palpitations, wheezing, fever, anorexia.   Cough Associated symptoms: no fever, no shortness of breath and no wheezing       Past Medical History:  Diagnosis Date   Arthritis    Asthma    COPD (chronic obstructive pulmonary disease) (HCC)    Diabetes mellitus without complication ([place])    Hypertension     Patient Active Problem List   Diagnosis Date Noted   Family hx of colon cancer 08/23/2016   Disorder of rotator cuff of both shoulders 03/11/2014   Cervical spondylosis without myelopathy 03/11/2014   Arthritis, shoulder region 03/11/2014   Pain in shoulder 03/11/2014    Past Surgical History:  Procedure Laterality Date   ANKLE SURGERY     Arm surgery     COLONOSCOPY N/A 11/22/2016   Procedure: COLONOSCOPY;  Surgeon: Rogene Houston, MD;  Location: [facility];  Service: Endoscopy;  Laterality: N/A;  47   PENILE ADHESIONS LYSIS      Family History  Problem Relation Age of Onset   Heart attack Mother    Diabetes Mother    Diabetes Brother    Cancer Brother     Social History   Tobacco Use   Smoking status: Former    Packs/day: 0.00    Years: 58.00    Pack years: 0.00    Types: Cigarettes    Quit date: 08/31/2018    Years since quitting: 2.6   Smokeless tobacco: Never  Vaping Use   Vaping Use: Never  used  Substance Use Topics   Alcohol use: Yes    Comment: occasionally    Drug use: No    Home Medications Prior to Admission medications   Medication Sig Start Date End Date Taking? Authorizing Provider  albuterol (PROVENTIL HFA;VENTOLIN HFA) 108 (90 BASE) MCG/ACT inhaler Inhale 2 puffs into the lungs every 6 (six) hours as needed for wheezing or shortness of breath.     [provider]  aspirin EC 81 MG tablet Take 81 mg by mouth daily.    [provider]  Blood Glucose Monitoring Suppl (ONE TOUCH ULTRA 2) w/Device KIT  11/05/19   [provider]  cholecalciferol (VITAMIN D) 25 MCG (1000 UNIT) tablet Take 5,000 Units by mouth daily.    [provider]  enalapril (VASOTEC) 2.5 MG tablet Take 1 tablet by mouth daily. 08/29/18   [provider]  EPINEPHrine 0.3 mg/0.3 mL IJ SOAJ injection Inject 0.3 mLs (0.3 mg total) into the muscle once as needed (for allergic reaction). 04/08/16   Francine Graven, DO  hydrocortisone cream 1 % Apply 1 application topically as needed for itching.    [provider]  metFORMIN (GLUCOPHAGE) 500 MG tablet Take 500 mg by mouth 2 (two) times daily with a meal.    [provider]  Miconazole Nitrate 2 %  AERO Apply 1 application topically 3 (three) times daily.     [provider]  naproxen (NAPROSYN) 500 MG tablet Take 500 mg by mouth 2 (two) times daily with a meal.    [provider]  oxyCODONE (OXY IR/ROXICODONE) 5 MG immediate release tablet Take 1 tablet by mouth 3 (three) times daily. 09/17/16   [provider]  predniSONE (DELTASONE) 10 MG tablet Take 6 tablets day one, 5 tablets day two, 4 tablets day three, 3 tablets day four, 2 tablets day five, then 1 tablet day six 12/28/20   Triplett, Tammy, PA-C  simvastatin (ZOCOR) 20 MG tablet Take 20 mg by mouth every evening.    [provider]  tamsulosin (FLOMAX) 0.4 MG CAPS capsule Take 0.4 mg by mouth daily. 12/07/19    [provider]  varenicline (CHANTIX) 1 MG tablet Take 1 mg by mouth 2 (two) times daily.    [provider]    Allergies    Bee venom, Fish allergy, and Shellfish allergy  Review of Systems   Review of Systems  Constitutional:  Negative for fever.  Respiratory:  Positive for cough. Negative for shortness of breath and wheezing.   Gastrointestinal:  Positive for diarrhea.  All other systems reviewed and are negative.  Physical Exam Updated Vital Signs BP 129/90 (BP Location: Left Arm)   Pulse (!) 52   Temp 98.3 F (36.8 C) (Oral)   Resp 18   Ht 5' 7" (1.702 m)   Wt 68 kg   SpO2 97%   BMI 23.49 kg/m   Physical Exam Vitals and nursing note reviewed.  Constitutional:      General: He is not in acute distress.    Appearance: Normal appearance. He is not toxic-appearing.  HENT:     Head: Normocephalic and atraumatic.     Nose: Congestion present.     Mouth/Throat:     Mouth: Mucous membranes are moist.     Pharynx: Oropharynx is clear. No posterior oropharyngeal erythema.  Eyes:     General: No scleral icterus. Cardiovascular:     Rate and Rhythm: Normal rate. Rhythm irregular.     Pulses: Normal pulses.     Heart sounds: Normal heart sounds.  Pulmonary:     Effort: Pulmonary effort is normal. No respiratory distress.     Breath sounds: Examination of the right-middle field reveals rales. Rales present.  Abdominal:     General: Bowel sounds are normal.     Palpations: Abdomen is soft.  Skin:    General: Skin is warm and dry.     Capillary Refill: Capillary refill takes less than 2 seconds.     Findings: No rash.  Neurological:     General: No focal deficit present.     Mental Status: He is alert and oriented to person, place, and time.  Psychiatric:        Mood and Affect: Mood normal.        Behavior: Behavior normal.        Thought Content: Thought content normal.        Judgment: Judgment normal.   ED Results / Procedures / Treatments    Labs (all labs ordered are listed, but only abnormal results are displayed) Labs Reviewed  RESP PANEL BY RT-PCR (FLU A&B, COVID) [id number]   EKG EKG Interpretation  Date/Time:  Sunday April 23 2021 09:26:09 EDT Ventricular Rate:  80 PR Interval:  139 QRS Duration: 106 QT Interval:  447 QTC Calculation:  483 R Axis:   81 Text Interpretation: Unknown rhythm, irregular rate Low voltage, extremity leads Anteroseptal infarct, old PVC's present - no ischemia Confirmed by Noemi Chapel [phone]) on 04/23/2021 9:30:42 AM  Radiology DG Chest Port 1 View  Result Date: 04/23/2021 CLINICAL DATA:  Cough and congestion EXAM: PORTABLE CHEST 1 VIEW COMPARISON:  October 30, 2018 FINDINGS: The heart size and mediastinal contours are within normal limits. There is chronic elevation of left hemidiaphragm unchanged. Mild scar of the left lung base is unchanged. There is no focal infiltrate, pulmonary edema, or pleural effusion. Lungs are hyperinflated. The visualized skeletal structures are stable. IMPRESSION: No acute cardiopulmonary disease identified. Emphysema. Electronically Signed   By: Abelardo Diesel M.D.   On: 04/23/2021 08:55    Procedures Procedures   Medications Ordered in ED Medications - No data to display  ED Course  I have reviewed the triage vital signs and the nursing notes.  Pertinent labs & imaging results that were available during my care of the patient were reviewed by me and considered in my medical decision making (see chart for details).  MDM Rules/Calculators/A&P Yitzchok Carriger is a 75 year old male who presents with cough after COVID exposure.  Presentation is most consistent with upper respiratory infection.  COVID swab sent.  He is at increased risk due to COPD, age; however, because it has been greater than 5 days, unable to provide Paxlovid for symptoms. Chest x-ray results with no acute cardiopulmonary disease including pneumonia.  Known emphysematous changes on imaging. EKG  obtained because patient rhythm irregular on auscultation and palpation of radial pulse without history of irregular rhythm.  Denies chest pain, palpitations. EKG with chronic COPD changes.  Not hypoxic, or in acute respiratory distress.  Hemodynamically stable. Presentation is not consistent with a chronic cause of cough, unlikely because of COPD exacerbation given HPI.  Prescribed albuterol inhaler, Tessalon for symptomatic relief of symptoms.  Instructed patient that he will be contacted if COVID swab is positive.  Reviewed care today, patient has no questions at this time.  Safe for discharge.  I discussed this case with my attending physician who cosigned this note including patient's presenting symptoms, physical exam, and planned diagnostics and interventions. Attending physician stated agreement with plan or made changes to plan which were implemented.   Attending physician assessed patient at bedside.   Final Clinical Impression(s) / ED Diagnoses Final diagnoses:  Cough with exposure to COVID-19 virus    Rx / DC Orders ED Discharge Orders          Ordered    albuterol (VENTOLIN HFA) 108 (90 Base) MCG/ACT inhaler  Every 6 hours PRN        04/23/21 0936    benzonatate (TESSALON) 100 MG capsule  Every 8 hours        04/23/21 0936             Mickie Hillier, PA-C 04/23/21 0944    Noemi Chapel, MD 04/24/21 [phone]

## 2021-04-23 NOTE — ED Triage Notes (Signed)
Significant other positive for covid; pt complains of cough and congestion at this time.

## 2021-04-23 NOTE — ED Provider Notes (Signed)
Patient is a 75 year old male, history of diabetes hypertension and high cholesterol, also has a history of COPD on albuterol as needed, continues to smoke.  He has been exposed to a significant other who is COVID-positive and for approximately 5 to 6 days he has had ongoing coughing himself.  On exam this patient does have some slight expiratory wheezing but is able to speak in full sentences, he is not tachypneic, he is not hypoxic, he has some scattered rales and rhonchi that seem to clear with deep breathing and coughing.  He has no edema of the legs, no JVD in the neck, he has a clear oropharynx with moist mucous membranes, he does have a slight bradycardia with a slight irregularity thus an EKG will be performed to make sure he is not in atrial fibrillation.  Chest x-ray to rule out other causes of shortness of breath though I suspect this is COVID-pneumonia given his exposure.  The patient is outside the window for antiviral therapy but thankfully does not appear toxic febrile or septic.   EKG Interpretation  Date/Time:  Sunday April 23 2021 09:26:09 EDT Ventricular Rate:  80 PR Interval:  139 QRS Duration: 106 QT Interval:  447 QTC Calculation: 483 R Axis:   81 Text Interpretation: Unknown rhythm, irregular rate Low voltage, extremity leads Anteroseptal infarct, old PVC's present - no ischemia Confirmed by Noemi Chapel [phone]) on 04/23/2021 9:30:42 AM       Ronald Ward was evaluated in Emergency Department on 04/24/2021 for the symptoms described in the history of present illness. He was evaluated in the context of the global COVID-19 pandemic, which necessitated consideration that the patient might be at risk for infection with the SARS-CoV-2 virus that causes COVID-19. Institutional protocols and algorithms that pertain to the evaluation of patients at risk for COVID-19 are in a state of rapid change based on information released by regulatory bodies including the CDC and federal and  state organizations. These policies and algorithms were followed during the patient's care in the ED.   Medical screening examination/treatment/procedure(s) were conducted as a shared visit with non-physician practitioner(s) and myself.  I personally evaluated the patient during the encounter.  Clinical Impression:   Final diagnoses:  Cough with exposure to COVID-19 virus         Noemi Chapel, MD 04/24/21 [phone]

## 2021-04-23 NOTE — Discharge Instructions (Addendum)
You were seen in the emergency department today after being exposed to [place] from your wife.  Since then you have had a cough.  While you are here in the emergency department we got an x-ray which showed that you did not have any pneumonia.  As we discussed you will be contacted if your COVID results are positive. We are prescribing you a medication called albuterol. Please use two puffs every four hours as needed. Albuterol has side effects you should be aware of including increased heart rate, feelings of fluttering or a pounding heart, anxiety, shakiness/tremors.  Additionally I am prescribing a medication called Tessalon.  This is for coughing.  You can take 1 capsule every 8 hours as needed.  Please return to emergency department if you have worsening shortness of breath, fevers, chest pain.

## 2021-04-26 ENCOUNTER — Other Ambulatory Visit: Payer: Self-pay | Admitting: Urology

## 2021-04-26 ENCOUNTER — Other Ambulatory Visit: Payer: Self-pay

## 2021-04-26 DIAGNOSIS — N138 Other obstructive and reflux uropathy: Secondary | ICD-10-CM

## 2021-04-26 MED ORDER — TAMSULOSIN HCL 0.4 MG PO CAPS
0.4000 mg | ORAL_CAPSULE | Freq: Every day | ORAL | 3 refills | Status: DC
Start: 2021-04-26 — End: 2021-07-31

## 2021-04-26 NOTE — Telephone Encounter (Signed)
Patient calling about refill on:    tamsulosin (FLOMAX) 0.4 MG CAPS capsule  Patient is out of medication.   Please fill at: [place], [place] Phone:  [phone]  Fax:  [phone]     Call back:  [phone]  Thanks, Helene Kelp

## 2021-05-22 DIAGNOSIS — M545 Low back pain, unspecified: Secondary | ICD-10-CM | POA: Diagnosis not present

## 2021-05-22 DIAGNOSIS — E119 Type 2 diabetes mellitus without complications: Secondary | ICD-10-CM | POA: Diagnosis not present

## 2021-05-22 DIAGNOSIS — I10 Essential (primary) hypertension: Secondary | ICD-10-CM | POA: Diagnosis not present

## 2021-05-22 DIAGNOSIS — M25511 Pain in right shoulder: Secondary | ICD-10-CM | POA: Diagnosis not present

## 2021-05-22 DIAGNOSIS — M542 Cervicalgia: Secondary | ICD-10-CM | POA: Diagnosis not present

## 2021-05-22 DIAGNOSIS — M25512 Pain in left shoulder: Secondary | ICD-10-CM | POA: Diagnosis not present

## 2021-05-22 DIAGNOSIS — M47899 Other spondylosis, site unspecified: Secondary | ICD-10-CM | POA: Diagnosis not present

## 2021-05-22 DIAGNOSIS — M5136 Other intervertebral disc degeneration, lumbar region: Secondary | ICD-10-CM | POA: Diagnosis not present

## 2021-05-22 DIAGNOSIS — Z79891 Long term (current) use of opiate analgesic: Secondary | ICD-10-CM | POA: Diagnosis not present

## 2021-07-31 ENCOUNTER — Emergency Department (HOSPITAL_COMMUNITY)
Admission: EM | Admit: 2021-07-31 | Discharge: 2021-07-31 | Disposition: A | Discharge status: Home / Self Care | Payer: Medicare Other | Attending: Emergency Medicine | Admitting: Emergency Medicine

## 2021-07-31 ENCOUNTER — Encounter (HOSPITAL_COMMUNITY): Payer: Self-pay | Admitting: *Deleted

## 2021-07-31 ENCOUNTER — Emergency Department (HOSPITAL_COMMUNITY): Payer: Medicare Other

## 2021-07-31 DIAGNOSIS — E119 Type 2 diabetes mellitus without complications: Secondary | ICD-10-CM | POA: Insufficient documentation

## 2021-07-31 DIAGNOSIS — J441 Chronic obstructive pulmonary disease with (acute) exacerbation: Secondary | ICD-10-CM | POA: Diagnosis not present

## 2021-07-31 DIAGNOSIS — M25512 Pain in left shoulder: Secondary | ICD-10-CM | POA: Insufficient documentation

## 2021-07-31 DIAGNOSIS — Z7984 Long term (current) use of oral hypoglycemic drugs: Secondary | ICD-10-CM | POA: Insufficient documentation

## 2021-07-31 DIAGNOSIS — I10 Essential (primary) hypertension: Secondary | ICD-10-CM | POA: Diagnosis not present

## 2021-07-31 DIAGNOSIS — R0602 Shortness of breath: Secondary | ICD-10-CM | POA: Diagnosis present

## 2021-07-31 DIAGNOSIS — J45909 Unspecified asthma, uncomplicated: Secondary | ICD-10-CM | POA: Insufficient documentation

## 2021-07-31 DIAGNOSIS — Z20822 Contact with and (suspected) exposure to covid-19: Secondary | ICD-10-CM | POA: Diagnosis not present

## 2021-07-31 DIAGNOSIS — Z79899 Other long term (current) drug therapy: Secondary | ICD-10-CM | POA: Insufficient documentation

## 2021-07-31 DIAGNOSIS — M25511 Pain in right shoulder: Secondary | ICD-10-CM | POA: Diagnosis not present

## 2021-07-31 DIAGNOSIS — Z87891 Personal history of nicotine dependence: Secondary | ICD-10-CM | POA: Diagnosis not present

## 2021-07-31 LAB — CBC WITH DIFFERENTIAL/PLATELET
Abs Immature Granulocytes: 0.01 10*3/uL (ref 0.00–0.07)
Basophils Absolute: 0 10*3/uL (ref 0.0–0.1)
Basophils Relative: 1 %
Eosinophils Absolute: 0.1 10*3/uL (ref 0.0–0.5)
Eosinophils Relative: 1 %
HCT: 45 % (ref 39.0–52.0)
Hemoglobin: 13.9 g/dL (ref 13.0–17.0)
Immature Granulocytes: 0 %
Lymphocytes Relative: 47 %
Lymphs Abs: 2.1 10*3/uL (ref 0.7–4.0)
MCH: 29.6 pg (ref 26.0–34.0)
MCHC: 30.9 g/dL (ref 30.0–36.0)
MCV: 95.7 fL (ref 80.0–100.0)
Monocytes Absolute: 0.5 10*3/uL (ref 0.1–1.0)
Monocytes Relative: 11 %
Neutro Abs: 1.7 10*3/uL (ref 1.7–7.7)
Neutrophils Relative %: 40 %
Platelets: 201 10*3/uL (ref 150–400)
RBC: 4.7 MIL/uL (ref 4.22–5.81)
RDW: 13.4 % (ref 11.5–15.5)
WBC: 4.4 10*3/uL (ref 4.0–10.5)
nRBC: 0 % (ref 0.0–0.2)

## 2021-07-31 LAB — LACTIC ACID, PLASMA
Lactic Acid, Venous: 3.1 mmol/L (ref 0.5–1.9)
Lactic Acid, Venous: 1.6 mmol/L (ref 0.5–1.9)

## 2021-07-31 LAB — BASIC METABOLIC PANEL
Anion gap: 7 (ref 5–15)
BUN: 16 mg/dL (ref 8–23)
CO2: 29 mmol/L (ref 22–32)
Calcium: 9.2 mg/dL (ref 8.9–10.3)
Chloride: 103 mmol/L (ref 98–111)
Creatinine, Ser: 0.89 mg/dL (ref 0.61–1.24)
GFR, Estimated: >60 mL/min (ref >60)
Glucose, Bld: 133 mg/dL — ABNORMAL HIGH (ref 70–99)
Potassium: 4.6 mmol/L (ref 3.5–5.1)
Sodium: 139 mmol/L (ref 135–145)

## 2021-07-31 LAB — PROTIME-INR
INR: 1 (ref 0.8–1.2)
Prothrombin Time: 13.6 seconds (ref 11.4–15.2)

## 2021-07-31 LAB — RESP PANEL BY RT-PCR (FLU A&B, COVID) ARPGX2
Influenza A by PCR: NEGATIVE
Influenza B by PCR: NEGATIVE
SARS Coronavirus 2 by RT PCR: NEGATIVE

## 2021-07-31 LAB — TROPONIN I (HIGH SENSITIVITY): Troponin I (High Sensitivity): 19 ng/L — ABNORMAL HIGH (ref <18)

## 2021-07-31 MED ORDER — ALBUTEROL SULFATE HFA 108 (90 BASE) MCG/ACT IN AERS
2.0000 | INHALATION_SPRAY | RESPIRATORY_TRACT | Status: DC | PRN
  Filled 2021-07-31: qty 6.7

## 2021-07-31 MED ORDER — METHYLPREDNISOLONE SODIUM SUCC 125 MG IJ SOLR
125.0000 mg | Freq: Once | INTRAMUSCULAR | Status: AC
  Administered 2021-07-31: 15:00:00 125 mg via INTRAVENOUS
  Filled 2021-07-31: qty 2

## 2021-07-31 MED ORDER — SODIUM CHLORIDE 0.9 % IV BOLUS
1000.0000 mL | Freq: Once | INTRAVENOUS | Status: AC
  Administered 2021-07-31: 16:00:00 1000 mL via INTRAVENOUS

## 2021-07-31 MED ORDER — IPRATROPIUM-ALBUTEROL 0.5-2.5 (3) MG/3ML IN SOLN
3.0000 mL | Freq: Once | RESPIRATORY_TRACT | Status: AC
  Filled 2021-07-31: qty 3

## 2021-07-31 MED ORDER — DOXYCYCLINE HYCLATE 100 MG PO CAPS: 100.0000 mg | ORAL_CAPSULE | Freq: Two times a day (BID) | ORAL | 0 refills | Status: DC

## 2021-07-31 MED ORDER — ATROVENT HFA 17 MCG/ACT IN AERS: 2.0000 | INHALATION_SPRAY | RESPIRATORY_TRACT | 12 refills | Status: DC | PRN

## 2021-07-31 MED ORDER — BENZONATATE 100 MG PO CAPS: 100.0000 mg | ORAL_CAPSULE | Freq: Three times a day (TID) | ORAL | 0 refills | Status: DC

## 2021-07-31 MED ORDER — IPRATROPIUM-ALBUTEROL 0.5-2.5 (3) MG/3ML IN SOLN
RESPIRATORY_TRACT | Status: AC
  Administered 2021-07-31: 16:00:00 3 mL via RESPIRATORY_TRACT
  Filled 2021-07-31: qty 3

## 2021-07-31 MED ORDER — ALBUTEROL SULFATE HFA 108 (90 BASE) MCG/ACT IN AERS: 1.0000 | INHALATION_SPRAY | Freq: Four times a day (QID) | RESPIRATORY_TRACT | 0 refills | Status: DC | PRN

## 2021-07-31 MED ORDER — PREDNISONE 50 MG PO TABS: 50.0000 mg | ORAL_TABLET | Freq: Every day | ORAL | 0 refills | Status: AC

## 2021-07-31 NOTE — ED Triage Notes (Signed)
Coughing more than usual x 1 week

## 2021-07-31 NOTE — ED Provider Notes (Signed)
[facility] EMERGENCY DEPARTMENT Provider Note   CSN: [id number] Arrival date & time: 07/31/21  1301     History Chief Complaint  Patient presents with   Shortness of Breath    Ronald Ward is a 75 y.o. male with past medical history significant for hypertension, diabetes, COPD who presents for evaluation of cough.  Cough for "a long time."  States he is now coughing up yellow phlegm.  Has been using his albuterol puffers at home, does not use nebulizers.  Does state he has shortness of breath which is chronic and at baseline.  No lower extremity edema.  He denies any chest pain, worsening of his chronic shortness of breath.  Daughter did say per nursing that everyone house has been "sick."  He feels like he has had a fever however has not taken his temperature.  No headache, lightheadedness, dizziness, chest pain, abdominal pain, diarrhea, dysuria, lower extremity swelling.  No history of PE or DVT.  Does state he has bilateral shoulder pain which is chronic in nature.  No recent falls or injuries.  Denies additional aggravating or alleviating factors  History obtained from patient and past medical records.  No interpreter used.  HPI     Past Medical History:  Diagnosis Date   Arthritis    Asthma    COPD (chronic obstructive pulmonary disease) ([place])    Diabetes mellitus without complication ([place])    Hypertension     Patient Active Problem List   Diagnosis Date Noted   Family hx of colon cancer 08/23/2016   Disorder of rotator cuff of both shoulders 03/11/2014   Cervical spondylosis without myelopathy 03/11/2014   Arthritis, shoulder region 03/11/2014   Pain in shoulder 03/11/2014    Past Surgical History:  Procedure Laterality Date   ANKLE SURGERY     Arm surgery     COLONOSCOPY N/A 11/22/2016   Procedure: COLONOSCOPY;  Surgeon: Rogene Houston, MD;  Location: [facility];  Service: Endoscopy;  Laterality: N/A;  54   PENILE ADHESIONS LYSIS         Family  History  Problem Relation Age of Onset   Heart attack Mother    Diabetes Mother    Diabetes Brother    Cancer Brother     Social History   Tobacco Use   Smoking status: Former    Packs/day: 0.00    Years: 58.00    Pack years: 0.00    Types: Cigarettes    Quit date: 08/31/2018    Years since quitting: 2.9   Smokeless tobacco: Never  Vaping Use   Vaping Use: Never used  Substance Use Topics   Alcohol use: Yes    Comment: occasionally    Drug use: No    Home Medications Prior to Admission medications   Medication Sig Start Date End Date Taking? Authorizing Provider  albuterol (VENTOLIN HFA) 108 (90 Base) MCG/ACT inhaler Inhale 1-2 puffs into the lungs every 6 (six) hours as needed for wheezing or shortness of breath. 07/31/21  Yes Piya Mesch A, PA-C  benzonatate (TESSALON) 100 MG capsule Take 1 capsule (100 mg total) by mouth every 8 (eight) hours. 07/31/21  Yes Khari Lett A, PA-C  cholecalciferol (VITAMIN D) 25 MCG (1000 UNIT) tablet Take 5,000 Units by mouth daily.   Yes [provider]  doxycycline (VIBRAMYCIN) 100 MG capsule Take 1 capsule (100 mg total) by mouth 2 (two) times daily. 07/31/21  Yes Harwood Nall A, PA-C  enalapril (VASOTEC) 2.5  MG tablet Take 1 tablet by mouth daily. 08/29/18  Yes [provider]  EPINEPHrine 0.3 mg/0.3 mL IJ SOAJ injection Inject 0.3 mLs (0.3 mg total) into the muscle once as needed (for allergic reaction). 04/08/16  Yes Francine Graven, DO  ipratropium (ATROVENT HFA) 17 MCG/ACT inhaler Inhale 2 puffs into the lungs every 4 (four) hours as needed for wheezing. 07/31/21  Yes Mcarthur Ivins A, PA-C  meloxicam (MOBIC) 15 MG tablet Take 15 mg by mouth daily.   Yes [provider]  metFORMIN (GLUCOPHAGE) 500 MG tablet Take 500 mg by mouth daily with breakfast.   Yes [provider]  predniSONE (DELTASONE) 50 MG tablet Take 1 tablet (50 mg total) by mouth daily for 5 days. 07/31/21 08/05/21 Yes  Layth Cerezo A, PA-C  simvastatin (ZOCOR) 20 MG tablet Take 20 mg by mouth every evening.   Yes [provider]  tamsulosin (FLOMAX) 0.4 MG CAPS capsule TAKE 1 CAPSULE BY MOUTH DAILY. 04/28/21  Yes Dahlstedt, Annie Main, MD    Allergies    Bee venom, Fish allergy, and Shellfish allergy  Review of Systems   Review of Systems  Constitutional:  Positive for activity change, appetite change and fever (subjective).  HENT: Negative.    Respiratory:  Positive for cough, shortness of breath (chronic, unchanged from baseline) and wheezing. Negative for apnea, choking, chest tightness and stridor.   Cardiovascular: Negative.   Gastrointestinal: Negative.   Genitourinary: Negative.   Musculoskeletal:  Negative for back pain and neck stiffness.       L shoulder pain, chronic, unchanged from baseline per patient  Skin: Negative.   Neurological: Negative.   All other systems reviewed and are negative.  Physical Exam Updated Vital Signs BP 130/80    Pulse 95    Temp 98 F (36.7 C) (Oral)    Resp 19    Ht 5\' 7"  (1.702 m)    Wt 61.2 kg    SpO2 94%    BMI 21.14 kg/m   Physical Exam Vitals and nursing note reviewed.  Constitutional:      General: He is not in acute distress.    Appearance: He is well-developed. He is not ill-appearing, toxic-appearing or diaphoretic.  HENT:     Head: Normocephalic and atraumatic.  Eyes:     Pupils: Pupils are equal, round, and reactive to light.  Cardiovascular:     Rate and Rhythm: Normal rate and regular rhythm.     Pulses: Normal pulses.          Radial pulses are 2+ on the right side and 2+ on the left side.       Dorsalis pedis pulses are 2+ on the right side and 2+ on the left side.       Posterior tibial pulses are 2+ on the right side and 2+ on the left side.     Heart sounds: Normal heart sounds.  Pulmonary:     Effort: Pulmonary effort is normal. No respiratory distress.     Breath sounds: Wheezing present.     Comments: Expiratory  wheeze, speaks in full sentences without difficulty Abdominal:     General: Bowel sounds are normal. There is no distension.     Palpations: Abdomen is soft.     Comments: Soft, nontender  Musculoskeletal:        General: Normal range of motion.     Cervical back: Normal range of motion and neck supple.     Right lower leg: No tenderness.  No edema.     Left lower leg: No tenderness. No edema.     Comments: No bony tenderness, full range of motion, compartment soft  Skin:    General: Skin is warm and dry.     Capillary Refill: Capillary refill takes less than 2 seconds.     Comments: No obvious rash, lesion, edema  Neurological:     General: No focal deficit present.     Mental Status: He is alert and oriented to person, place, and time.    ED Results / Procedures / Treatments   Labs (all labs ordered are listed, but only abnormal results are displayed) Labs Reviewed  BASIC METABOLIC PANEL - Abnormal; Notable for the following components:      Result Value   Glucose, Bld 133 (*)    All other components within normal limits  LACTIC ACID, PLASMA - Abnormal; Notable for the following components:   Lactic Acid, Venous 3.1 (*)    All other components within normal limits  TROPONIN I (HIGH SENSITIVITY) - Abnormal; Notable for the following components:   Troponin I (High Sensitivity) 19 (*)    All other components within normal limits  RESP PANEL BY RT-PCR (FLU A&B, COVID) ARPGX2  CULTURE, BLOOD (ROUTINE X 2)  CULTURE, BLOOD (ROUTINE X 2)  CBC WITH DIFFERENTIAL/PLATELET  LACTIC ACID, PLASMA  PROTIME-INR    EKG EKG Interpretation  Date/Time:  Monday July 31 2021 13:26:57 EST Ventricular Rate:  72 PR Interval:  122 QRS Duration: 84 QT Interval:  380 QTC Calculation: 416 R Axis:   -23 Text Interpretation: Sinus rhythm with occasional Premature ventricular complexes Low voltage QRS Confirmed by Octaviano Glow [phone]) on 07/31/2021 2:46:46 PM  Radiology DG Chest 2  View  Result Date: 07/31/2021 CLINICAL DATA:  Cough and shortness of breath. EXAM: CHEST - 2 VIEW COMPARISON:  04/23/2021 FINDINGS: Chronic elevation of left hemidiaphragm. Stable linear densities in left lower chest are suggestive for areas of scarring. Lucency in the right upper lung is suggestive for emphysema. No focal airspace disease or lung consolidation. Heart and mediastinum are within normal limits. No acute bone abnormality. No pleural effusions. IMPRESSION: Chronic changes without acute findings. Electronically Signed   By: Markus Daft M.D.   On: 07/31/2021 14:46    Procedures Procedures   Medications Ordered in ED Medications  albuterol (VENTOLIN HFA) 108 (90 Base) MCG/ACT inhaler 2 puff (has no administration in time range)  methylPREDNISolone sodium succinate (SOLU-MEDROL) 125 mg/2 mL injection 125 mg (125 mg Intravenous Given 07/31/21 1436)  ipratropium-albuterol (DUONEB) 0.5-2.5 (3) MG/3ML nebulizer solution 3 mL (3 mLs Nebulization Given 07/31/21 1629)  sodium chloride 0.9 % bolus 1,000 mL (0 mLs Intravenous Stopped 07/31/21 1702)    ED Course  I have reviewed the triage vital signs and the nursing notes.  Pertinent labs & imaging results that were available during my care of the patient were reviewed by me and considered in my medical decision making (see chart for details).   75 year old here for evaluation of cough.  He is afebrile, nonseptic, not ill-appearing.  Sepsis labs ordered prior to my initial evaluation.  Does have some expiratory wheeze, history of COPD.  He does not appear grossly fluid overloaded.  His compartments are soft.  No history of PE or DVT.  According to nursing family states other family numbers have been sick.  No hemoptysis, chest pain, back pain, unilateral weakness.  Has subjective fever at home.  Given his wheeze on exam we will  plan on checking labs, chest x-ray, EKG, treat for COPD exacerbation and reassess  Labs and imaging personally  reviewed and interpreted:  CBC without leukocytosis BMP glucose 133, no additional abnormality Lactic 3.1>>1.6 COVID. Flu negative Trop 19>> due to history, timing of symptoms low suspicion for acute ACS, would suspect at this time troponin will be significantly elevated EKG without ischemic changes DG chest chronic changes, no acute finding  Patient reassessed.  Clear lung sounds.  Feels significantly better after steroids, breathing treatment, suspect COPD exacerbation.  Will DC home with symptomatic management.  Low suspicion for acute ACS, PE, dissection, edema as cause of his symptoms  VSS, no tracheal deviation, no JVD or new murmur, RRR, EKG without acute abnormalities, negative troponin, and negative CXR. Pt has been advised to return to the ED if CP becomes exertional, associated with diaphoresis or nausea, radiates to left jaw/arm, worsens or becomes concerning in any way.  The patient has been appropriately medically screened and/or stabilized in the ED. I have low suspicion for any other emergent medical condition which would require further screening, evaluation or treatment in the ED or require inpatient management.  Patient is hemodynamically stable and in no acute distress.  Patient able to ambulate in department prior to ED.  Evaluation does not show acute pathology that would require ongoing or additional emergent interventions while in the emergency department or further inpatient treatment.  I have discussed the diagnosis with the patient and answered all questions.  Pain is been managed while in the emergency department and patient has no further complaints prior to discharge.  Patient is comfortable with plan discussed in room and is stable for discharge at this time.  I have discussed strict return precautions for returning to the emergency department.  Patient was encouraged to follow-up with PCP/specialist refer to at discharge.     MDM Rules/Calculators/A&P                              Final Clinical Impression(s) / ED Diagnoses Final diagnoses:  COPD exacerbation ([place])    Rx / DC Orders ED Discharge Orders          Ordered    predniSONE (DELTASONE) 50 MG tablet  Daily        07/31/21 2019    albuterol (VENTOLIN HFA) 108 (90 Base) MCG/ACT inhaler  Every 6 hours PRN        07/31/21 2019    ipratropium (ATROVENT HFA) 17 MCG/ACT inhaler  Every 4 hours PRN        07/31/21 2019    benzonatate (TESSALON) 100 MG capsule  Every 8 hours        07/31/21 2019    doxycycline (VIBRAMYCIN) 100 MG capsule  2 times daily        07/31/21 2019             Michala Deblanc A, PA-C 07/31/21 2308    Wyvonnia Dusky, MD 08/01/21 [phone]

## 2021-07-31 NOTE — ED Notes (Signed)
07/31/2021 Pt placed on cardiac monitor with BP to set cycle every 30 minutes. Continuous pulse oximeter applied.

## 2021-07-31 NOTE — Discharge Instructions (Addendum)
Take the medications as prescribed  Return for new or worsening symptoms 

## 2021-08-05 LAB — CULTURE, BLOOD (ROUTINE X 2)
Culture: NO GROWTH
Culture: NO GROWTH
Special Requests: ADEQUATE

## 2021-10-14 ENCOUNTER — Other Ambulatory Visit: Payer: Self-pay

## 2021-10-14 ENCOUNTER — Emergency Department (HOSPITAL_COMMUNITY): Payer: Medicare Other

## 2021-10-14 ENCOUNTER — Encounter (HOSPITAL_COMMUNITY): Payer: Self-pay

## 2021-10-14 ENCOUNTER — Emergency Department (HOSPITAL_COMMUNITY)
Admission: EM | Admit: 2021-10-14 | Discharge: 2021-10-14 | Disposition: A | Discharge status: Home / Self Care | Payer: Medicare Other | Attending: Emergency Medicine | Admitting: Emergency Medicine

## 2021-10-14 DIAGNOSIS — Z992 Dependence on renal dialysis: Secondary | ICD-10-CM | POA: Insufficient documentation

## 2021-10-14 DIAGNOSIS — R52 Pain, unspecified: Secondary | ICD-10-CM | POA: Diagnosis not present

## 2021-10-14 DIAGNOSIS — R0781 Pleurodynia: Secondary | ICD-10-CM | POA: Insufficient documentation

## 2021-10-14 DIAGNOSIS — N186 End stage renal disease: Secondary | ICD-10-CM | POA: Diagnosis not present

## 2021-10-14 DIAGNOSIS — Z743 Need for continuous supervision: Secondary | ICD-10-CM | POA: Diagnosis not present

## 2021-10-14 DIAGNOSIS — M545 Low back pain, unspecified: Secondary | ICD-10-CM | POA: Diagnosis not present

## 2021-10-14 DIAGNOSIS — Y92410 Unspecified street and highway as the place of occurrence of the external cause: Secondary | ICD-10-CM | POA: Diagnosis not present

## 2021-10-14 DIAGNOSIS — M79622 Pain in left upper arm: Secondary | ICD-10-CM | POA: Diagnosis not present

## 2021-10-14 DIAGNOSIS — S161XXA Strain of muscle, fascia and tendon at neck level, initial encounter: Secondary | ICD-10-CM | POA: Insufficient documentation

## 2021-10-14 DIAGNOSIS — M25522 Pain in left elbow: Secondary | ICD-10-CM | POA: Diagnosis not present

## 2021-10-14 DIAGNOSIS — S199XXA Unspecified injury of neck, initial encounter: Secondary | ICD-10-CM | POA: Diagnosis present

## 2021-10-14 DIAGNOSIS — I700 Atherosclerosis of aorta: Secondary | ICD-10-CM | POA: Diagnosis not present

## 2021-10-14 DIAGNOSIS — M549 Dorsalgia, unspecified: Secondary | ICD-10-CM | POA: Diagnosis not present

## 2021-10-14 MED ORDER — METHOCARBAMOL 500 MG PO TABS: 500.0000 mg | ORAL_TABLET | Freq: Two times a day (BID) | ORAL | 0 refills | Status: DC

## 2021-10-14 MED ORDER — TRAMADOL HCL 50 MG PO TABS
50.0000 mg | ORAL_TABLET | Freq: Four times a day (QID) | ORAL | 0 refills | Status: DC | PRN
Start: 2021-10-14 — End: 2022-01-20

## 2021-10-14 MED ORDER — OXYCODONE-ACETAMINOPHEN 5-325 MG PO TABS
1.0000 | ORAL_TABLET | Freq: Once | ORAL | Status: AC
  Administered 2021-10-14: 1 via ORAL
  Filled 2021-10-14: qty 1
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
## 2021-10-14 NOTE — Discharge Instructions (Signed)
Your x-rays today were all negative for acute fractures.  You will likely have some lingering muscle soreness and stiffness over the next week or so.  You may find that you feel worse tomorrow than you do today.  I have sent you home with some muscle relaxers as well as a few tablets of tramadol which you can take if Tylenol is not sufficient. ? ?If you do not feel better after another week, please follow-up with your primary care doctor for reevaluation.  If you become quite short of breath or have worsening abdominal pain, please return to the ED for reevaluation. ?

## 2021-10-25 ENCOUNTER — Encounter (HOSPITAL_COMMUNITY): Payer: Self-pay

## 2021-10-25 ENCOUNTER — Emergency Department (HOSPITAL_COMMUNITY)
Admission: EM | Admit: 2021-10-25 | Discharge: 2021-10-25 | Disposition: A | Discharge status: Home / Self Care | Payer: Medicare Other | Attending: Emergency Medicine | Admitting: Emergency Medicine

## 2021-10-25 ENCOUNTER — Emergency Department (HOSPITAL_COMMUNITY): Payer: Medicare Other

## 2021-10-25 ENCOUNTER — Other Ambulatory Visit: Payer: Self-pay

## 2021-10-25 ENCOUNTER — Telehealth (HOSPITAL_COMMUNITY): Payer: Self-pay | Admitting: Emergency Medicine

## 2021-10-25 DIAGNOSIS — S2241XD Multiple fractures of ribs, right side, subsequent encounter for fracture with routine healing: Secondary | ICD-10-CM | POA: Insufficient documentation

## 2021-10-25 DIAGNOSIS — S3992XD Unspecified injury of lower back, subsequent encounter: Secondary | ICD-10-CM | POA: Diagnosis not present

## 2021-10-25 DIAGNOSIS — R0781 Pleurodynia: Secondary | ICD-10-CM | POA: Insufficient documentation

## 2021-10-25 DIAGNOSIS — Z7984 Long term (current) use of oral hypoglycemic drugs: Secondary | ICD-10-CM | POA: Diagnosis not present

## 2021-10-25 DIAGNOSIS — S299XXD Unspecified injury of thorax, subsequent encounter: Secondary | ICD-10-CM | POA: Diagnosis present

## 2021-10-25 DIAGNOSIS — S2241XA Multiple fractures of ribs, right side, initial encounter for closed fracture: Secondary | ICD-10-CM | POA: Diagnosis not present

## 2021-10-25 MED ORDER — OXYCODONE HCL 5 MG PO TABS: 5.0000 mg | ORAL_TABLET | Freq: Four times a day (QID) | ORAL | 0 refills | Status: DC | PRN

## 2021-10-25 MED ORDER — OXYCODONE-ACETAMINOPHEN 5-325 MG PO TABS
1.0000 | ORAL_TABLET | Freq: Once | ORAL | Status: AC
  Administered 2021-10-25: 1 via ORAL
  Filled 2021-10-25: qty 1

## 2021-10-25 MED ORDER — OXYCODONE-ACETAMINOPHEN 5-325 MG PO TABS: 1.0000 | ORAL_TABLET | ORAL | 0 refills | Status: DC | PRN

## 2021-10-25 NOTE — Discharge Instructions (Addendum)
Use the incentive spirometer as directed.  You may use a folded towel or small pillow held tube firmly to your side when you need to cough or take deep breaths.  Continue your albuterol treatments 1 every 4-6 hours as needed.  You may contact the primary care provider listed to establish primary care and arrange for follow-up visit.  Return emergency department for any new or worsening symptoms. ?
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
## 2021-10-25 NOTE — Telephone Encounter (Signed)
I was notified by nursing staff that pharmacy is out of Percocet 5/325 mg.  We will change prescription to oxycodone 5 mg and sent to patient's pharmacy of preference ?
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
## 2021-11-06 ENCOUNTER — Encounter (INDEPENDENT_AMBULATORY_CARE_PROVIDER_SITE_OTHER): Payer: Self-pay | Admitting: *Deleted

## 2021-11-28 DIAGNOSIS — L84 Corns and callosities: Secondary | ICD-10-CM | POA: Diagnosis not present

## 2021-11-28 DIAGNOSIS — E1142 Type 2 diabetes mellitus with diabetic polyneuropathy: Secondary | ICD-10-CM | POA: Diagnosis not present

## 2021-11-28 DIAGNOSIS — M79676 Pain in unspecified toe(s): Secondary | ICD-10-CM | POA: Diagnosis not present

## 2021-11-28 DIAGNOSIS — B351 Tinea unguium: Secondary | ICD-10-CM | POA: Diagnosis not present

## 2021-12-18 ENCOUNTER — Encounter (HOSPITAL_COMMUNITY): Payer: Self-pay | Admitting: Emergency Medicine

## 2021-12-18 ENCOUNTER — Emergency Department (HOSPITAL_COMMUNITY)
Admission: EM | Admit: 2021-12-18 | Discharge: 2021-12-18 | Disposition: A | Discharge status: Home / Self Care | Payer: Medicare Other | Attending: Emergency Medicine | Admitting: Emergency Medicine

## 2021-12-18 ENCOUNTER — Other Ambulatory Visit: Payer: Self-pay

## 2021-12-18 DIAGNOSIS — R109 Unspecified abdominal pain: Secondary | ICD-10-CM | POA: Insufficient documentation

## 2021-12-18 DIAGNOSIS — R1032 Left lower quadrant pain: Secondary | ICD-10-CM | POA: Diagnosis not present

## 2021-12-18 DIAGNOSIS — I10 Essential (primary) hypertension: Secondary | ICD-10-CM | POA: Insufficient documentation

## 2021-12-18 DIAGNOSIS — R339 Retention of urine, unspecified: Secondary | ICD-10-CM | POA: Insufficient documentation

## 2021-12-18 DIAGNOSIS — R972 Elevated prostate specific antigen [PSA]: Secondary | ICD-10-CM | POA: Diagnosis not present

## 2021-12-18 DIAGNOSIS — J449 Chronic obstructive pulmonary disease, unspecified: Secondary | ICD-10-CM | POA: Diagnosis not present

## 2021-12-18 DIAGNOSIS — E119 Type 2 diabetes mellitus without complications: Secondary | ICD-10-CM | POA: Insufficient documentation

## 2021-12-18 DIAGNOSIS — Z7984 Long term (current) use of oral hypoglycemic drugs: Secondary | ICD-10-CM | POA: Diagnosis not present

## 2021-12-18 DIAGNOSIS — Z7951 Long term (current) use of inhaled steroids: Secondary | ICD-10-CM | POA: Insufficient documentation

## 2021-12-18 DIAGNOSIS — R52 Pain, unspecified: Secondary | ICD-10-CM | POA: Diagnosis not present

## 2021-12-18 DIAGNOSIS — Z79899 Other long term (current) drug therapy: Secondary | ICD-10-CM | POA: Insufficient documentation

## 2021-12-18 DIAGNOSIS — Z743 Need for continuous supervision: Secondary | ICD-10-CM | POA: Diagnosis not present

## 2021-12-18 DIAGNOSIS — R5381 Other malaise: Secondary | ICD-10-CM | POA: Diagnosis not present

## 2021-12-18 LAB — CBC
HCT: 42.8 % (ref 39.0–52.0)
Hemoglobin: 13.9 g/dL (ref 13.0–17.0)
MCH: 29.6 pg (ref 26.0–34.0)
MCHC: 32.5 g/dL (ref 30.0–36.0)
MCV: 91.3 fL (ref 80.0–100.0)
Platelets: 210 10*3/uL (ref 150–400)
RBC: 4.69 MIL/uL (ref 4.22–5.81)
RDW: 13.7 % (ref 11.5–15.5)
WBC: 6.5 10*3/uL (ref 4.0–10.5)
nRBC: 0 % (ref 0.0–0.2)

## 2021-12-18 LAB — URINALYSIS, ROUTINE W REFLEX MICROSCOPIC
Bilirubin Urine: NEGATIVE
Glucose, UA: NEGATIVE mg/dL
Ketones, ur: NEGATIVE mg/dL
Leukocytes,Ua: NEGATIVE
Nitrite: NEGATIVE
Protein, ur: NEGATIVE mg/dL
Specific Gravity, Urine: 1.01 (ref 1.005–1.030)
pH: 5 (ref 5.0–8.0)

## 2021-12-18 LAB — BASIC METABOLIC PANEL
Anion gap: 7 (ref 5–15)
BUN: 18 mg/dL (ref 8–23)
CO2: 30 mmol/L (ref 22–32)
Calcium: 9.9 mg/dL (ref 8.9–10.3)
Chloride: 104 mmol/L (ref 98–111)
Creatinine, Ser: 0.96 mg/dL (ref 0.61–1.24)
GFR, Estimated: >60 mL/min (ref >60)
Glucose, Bld: 124 mg/dL — ABNORMAL HIGH (ref 70–99)
Potassium: 4.6 mmol/L (ref 3.5–5.1)
Sodium: 141 mmol/L (ref 135–145)

## 2021-12-18 NOTE — Discharge Instructions (Signed)
You have urinary retention,  Foley catheter in place, please leave it in and continue with taking medication. ? ?Please follow-up with urology for further evaluation ? ?Come back to the emergency department if you develop chest pain, shortness of breath, severe abdominal pain, uncontrolled nausea, vomiting, diarrhea. ? ?

## 2021-12-18 NOTE — ED Notes (Signed)
Emptied 1,100 mL of urine out of foley bag  ?

## 2021-12-18 NOTE — ED Notes (Addendum)
Foley bag changed to leg bag. Instructed pt on use of leg bag and pt verbalized understanding.  ?

## 2021-12-18 NOTE — ED Notes (Signed)
Bladder scanner indicates pt retaining over 993 mL of urine  ?

## 2021-12-18 NOTE — ED Provider Notes (Signed)
?[facility] ?Provider Note ? ? ?CSN: [id number] ?Arrival date & time: 12/18/21  1409 ? ?  ? ?History ? ?Chief Complaint  ?Patient presents with  ? Urinary Retention  ? ? ?Ronald Ward is a 76 y.o. male. ? ?HPI ? ?With medical history including diabetes, hypertension, COPD, BPH presents emerged part with complaints of difficulty urinating.  Patient has been unable to urinate for about 2 days, states that she have increased pain in his lower suprapubic region, he denies any flank tenderness nausea vomiting diarrhea, denies any fevers chills general body aches.  He has no other complaints.  He states that he typically can take Flomax but this is not work for him.  He is currently followed by urology. ? ?Reviewed patient's chart followed by Dr.Dahlstedt of urology, has elevated BPH, is currently on Flomax, most recent creatinine was obtained on 12/19 of last year shows a creatinine of 0.89 ? ?Home Medications ?Prior to Admission medications   ?Medication Sig Start Date End Date Taking? Authorizing Provider  ?albuterol (VENTOLIN HFA) 108 (90 Base) MCG/ACT inhaler Inhale 1-2 puffs into the lungs every 6 (six) hours as needed for wheezing or shortness of breath. 07/31/21   Henderly, Britni A, PA-C  ?benzonatate (TESSALON) 100 MG capsule Take 1 capsule (100 mg total) by mouth every 8 (eight) hours. 07/31/21   Henderly, Britni A, PA-C  ?cholecalciferol (VITAMIN D) 25 MCG (1000 UNIT) tablet Take 5,000 Units by mouth daily.    [provider]  ?doxycycline (VIBRAMYCIN) 100 MG capsule Take 1 capsule (100 mg total) by mouth 2 (two) times daily. 07/31/21   Henderly, Britni A, PA-C  ?enalapril (VASOTEC) 2.5 MG tablet Take 1 tablet by mouth daily. 08/29/18   [provider]  ?EPINEPHrine 0.3 mg/0.3 mL IJ SOAJ injection Inject 0.3 mLs (0.3 mg total) into the muscle once as needed (for allergic reaction). 04/08/16   Francine Graven, DO  ?ipratropium (ATROVENT HFA) 17 MCG/ACT inhaler Inhale 2  puffs into the lungs every 4 (four) hours as needed for wheezing. 07/31/21   Henderly, Britni A, PA-C  ?meloxicam (MOBIC) 15 MG tablet Take 15 mg by mouth daily.    [provider]  ?metFORMIN (GLUCOPHAGE) 500 MG tablet Take 500 mg by mouth daily with breakfast.    [provider]  ?methocarbamol (ROBAXIN) 500 MG tablet Take 1 tablet (500 mg total) by mouth 2 (two) times daily. 10/14/21   Tonye Pearson, PA-C  ?oxyCODONE (ROXICODONE) 5 MG immediate release tablet Take 1 tablet (5 mg total) by mouth every 6 (six) hours as needed for severe pain. 10/25/21   Triplett, Tammy, PA-C  ?oxyCODONE-acetaminophen (PERCOCET/ROXICET) 5-325 MG tablet Take 1 tablet by mouth every 4 (four) hours as needed. 10/25/21   Triplett, Tammy, PA-C  ?simvastatin (ZOCOR) 20 MG tablet Take 20 mg by mouth every evening.    [provider]  ?tamsulosin (FLOMAX) 0.4 MG CAPS capsule TAKE 1 CAPSULE BY MOUTH DAILY. 04/28/21   Franchot Gallo, MD  ?traMADol (ULTRAM) 50 MG tablet Take 1 tablet (50 mg total) by mouth every 6 (six) hours as needed. 10/14/21   Tonye Pearson, PA-C  ?   ? ?Allergies    ?Bee venom, Fish allergy, and Shellfish allergy   ? ?Review of Systems   ?Review of Systems  ?Constitutional:  Negative for chills and fever.  ?Respiratory:  Negative for shortness of breath.   ?Cardiovascular:  Negative for chest pain.  ?Gastrointestinal:  Negative for abdominal pain, diarrhea,  nausea and vomiting.  ?Neurological:  Negative for headaches.  ? ?Physical Exam ?Updated Vital Signs ?BP (!) 164/90 (BP Location: Right Arm)   Pulse 82   Temp 98 ?F (36.7 ?C) (Oral)   Resp 18   Ht _0  (1.702 m)   Wt 68 kg   SpO2 98%   BMI 23.49 kg/m?  ?Physical Exam ?Vitals and nursing note reviewed.  ?Constitutional:   ?   General: He is not in acute distress. ?   Appearance: He is not ill-appearing.  ?HENT:  ?   Head: Normocephalic and atraumatic.  ?   Nose: No congestion.  ?Eyes:  ?   Conjunctiva/sclera: Conjunctivae normal.   ?Cardiovascular:  ?   Rate and Rhythm: Normal rate and regular rhythm.  ?   Pulses: Normal pulses.  ?   Heart sounds: No murmur heard. ?  No friction rub. No gallop.  ?Pulmonary:  ?   Effort: No respiratory distress.  ?   Breath sounds: No wheezing, rhonchi or rales.  ?Abdominal:  ?   Palpations: Abdomen is soft.  ?   Tenderness: There is abdominal tenderness. There is no right CVA tenderness or left CVA tenderness.  ?   Comments: Nondistended no active bowel sounds dull to percussion, has suprapubic tenderness without mass, no guarding rebound has peritoneal sign negative Murphy sign McBurney point.  ?Skin: ?   General: Skin is warm and dry.  ?Neurological:  ?   Mental Status: He is alert.  ?Psychiatric:     ?   Mood and Affect: Mood normal.  ? ? ?ED Results / Procedures / Treatments   ?Labs ?(all labs ordered are listed, but only abnormal results are displayed) ?Labs Reviewed  ?URINALYSIS, ROUTINE W REFLEX MICROSCOPIC - Abnormal; Notable for the following components:  ?    Result Value  ? Hgb urine dipstick SMALL (*)   ? Bacteria, UA RARE (*)   ? All other components within normal limits  ? ? ?EKG ?None ? ?Radiology ?No results found. ? ?Procedures ?Procedures  ? ? ?Medications Ordered in ED ?Medications - No data to display ? ?ED Course/ Medical Decision Making/ A&P ?  ?                        ?Medical Decision Making ?Amount and/or Complexity of Data Reviewed ?Labs: ordered. ? ? ?This patient presents to the ED for concern of suprapubic pain, this involves an extensive number of treatment options, and is a complaint that carries with it a high risk of complications and morbidity.  The differential diagnosis includes urinary retention, UTI, kidney stone ? ? ? ?Additional history obtained: ? ?Additional history obtained from N/A ?External records from outside source obtained and reviewed including previous urology notes ? ? ?Co morbidities that complicate the patient evaluation ? ?Elevated BPH ? ?Social  Determinants of Health: ? ?N/A ? ? ? ?Lab Tests: ? ?I Ordered, and personally interpreted labs.  The pertinent results include: UA shows 6-10 red blood cells rare bacteria, cBC unremarkable, BMP unremarkable ? ? ?Imaging Studies ordered: ? ?I ordered imaging studies including bladder scan ?I independently visualized and interpreted imaging which showed  1068m of urine in the bladder ?I agree with the radiologist interpretation ? ? ?Cardiac Monitoring: ? ?The patient was maintained on a cardiac monitor.  I personally viewed and interpreted the cardiac monitored which showed an underlying rhythm of: N/A ? ? ?Medicines ordered and prescription drug management: ? ?I ordered  medication including N/A ?I have reviewed the patients home medicines and have made adjustments as needed ? ?Critical Interventions: ? ?N/A ? ? ?Reevaluation: ? ?Presents with suprapubic tenderness, bladder scan shows greater than 1000 mL of urine, likely he has obstruction from BPH, will place Foley and continue to monitor ? ?Drained approximately 2500 mL of urine, he is feeling much better having no complaints, UA is unremarkable, waiting for CBC and be met. ? ?CBC BMP are unremarkable patient is ready for discharge. ? ?Consultations Obtained: ? ?N/A ? ? ? ?Test Considered: ? ?CT on pelvis-we will defer as my suspicion for intra-abdominal abnormality very low at this time nonsurgical abdomen he is nontoxic-appearing lab work is reassuring. ? ? ? ?Rule out ?I have low suspicion for UTI [place], kidney stone UA is unremarkable or signs of infection or hematuria, he is having no stomach pain or flank tenderness no CVA tenderness.   ? ? ? ?Dispostion and problem list ? ?After consideration of the diagnostic results and the patients response to treatment, I feel that the patent would benefit from discharge. ? ?Urinary retention-Foley catheter was which is draining well, likely secondary due to BPH, will have him follow-up with urology for further  evaluation and strict return precautions. ? ? ? ? ? ? ? ? ? ? ? ?Final Clinical Impression(s) / ED Diagnoses ?Final diagnoses:  ?None  ? ? ?Rx / DC Orders ?ED Discharge Orders   ? ? None  ? ?  ? ? ?  ?Marcello Fennel, PA-C ?05

## 2021-12-18 NOTE — ED Triage Notes (Signed)
Urinary retention x 24 hours, history of enlarged prostate. ?

## 2021-12-20 ENCOUNTER — Ambulatory Visit (INDEPENDENT_AMBULATORY_CARE_PROVIDER_SITE_OTHER): Payer: Medicare Other | Admitting: Physician Assistant

## 2021-12-20 VITALS — BP 143/90 | HR 89 | Ht 67.0 in | Wt 140.0 lb

## 2021-12-20 DIAGNOSIS — R339 Retention of urine, unspecified: Secondary | ICD-10-CM

## 2021-12-20 DIAGNOSIS — R310 Gross hematuria: Secondary | ICD-10-CM

## 2021-12-20 DIAGNOSIS — N138 Other obstructive and reflux uropathy: Secondary | ICD-10-CM

## 2021-12-20 DIAGNOSIS — N401 Enlarged prostate with lower urinary tract symptoms: Secondary | ICD-10-CM

## 2021-12-20 MED ORDER — TAMSULOSIN HCL 0.4 MG PO CAPS: 0.4000 mg | ORAL_CAPSULE | Freq: Every day | ORAL | 11 refills | Status: DC

## 2021-12-20 NOTE — Progress Notes (Signed)
? ?Assessment: ?1. Urinary retention ? ?2. Gross hematuria ? ?3. BPH with urinary obstruction ?  ? ?Plan: ?Foley flushed and adjusted to relieve the tension on the urethral meatus.  He is provided with an extra leg bag as well as a larger bag for gravity.  Foley care discussed. No gross hematuria noted today. He will continue tamsulosin that he has already begun and will follow-up in 5 to 7 days for voiding trial.  Tamsulosin prescription renewed. ? ?Chief Complaint: ?Urinary retention ? ?HPI: ?Ronald Ward is a 76 y.o. male with a history of BPH who presents for evaluation of sudden onset acute urinary retention that started on 12/16/2021 a couple of days after he ran out of his ongoing tamsulosin prescription.  He presented to the emergency department on 12/18/2021 was found to be in acute retention with placement of Foley catheter.  His tamsulosin was refilled at that time and he has been taking it since.  Urinalysis in the ED was unremarkable.  Since insertion of the Foley, the patient has had intermittent gross hematuria and mild discharge from the urethra with some standing and burning.  He is changing the leg bag when it becomes approximately half full of urine.  Fever, chills, nausea vomiting.  No abdominal pain. . ? ?ED eval on 12/18/21 ?With medical history including diabetes, hypertension, COPD, BPH presents emerged part with complaints of difficulty urinating.  Patient has been unable to urinate for about 2 days, states that she have increased pain in his lower suprapubic region, he denies any flank tenderness nausea vomiting diarrhea, denies any fevers chills general body aches.  He has no other complaints.  He states that he typically can take Flomax but this is not work for him.  He is currently followed by urology. ?  ?Reviewed patient's chart followed by Dr.Dahlstedt of urology, has elevated BPH, is currently on Flomax, most recent creatinine was obtained on 12/19 of last year shows a creatinine of  0.89Presents with suprapubic tenderness, bladder scan shows greater than 1000 mL of urine, likely he has obstruction from BPH, will place Foley and continue to monitor ?  ?Drained approximately 2500 mL of urine, he is feeling much better having no complaints, UA is unremarkable, waiting for CBC and be met. ? ?03/28/21 ?Urology OV ?He was originally referred in October, 2014 by Dr. Legrand Rams for evaluation and management of an elevated PSA. He was seen in September 2012 @ [facility] for a PSA that went from 3.7 in June 2011 to 5.3 in June 2012.  ?11.30.2012: TRUS/Bx. PSA 5.3. Prostatic volume was 38.5. PSAD 0.14. All biopsies revealed benign tissue, with foci of chronic prostatitis. At that time he was incarcerated. He was followed after that, and apparently PSA was slightly elevated, but he did not undergo a repeat biopsy. Prior to his original referral, he had a PSA drawn by Dr. Legrand Rams that was 8.  ?PCA 3 test at his initial visit in October 2014 was positive at 33. That revealed a 30% chance of having a positive biopsy if it was to be repeated.  ?2.3.2015. Repeat TRUS/Bx. Prostatic volume was approximately 70 mL. All cores were benign.  ?Repeat PSA levels:  ?8.12.2015-4.69 (14% free)  ?8.23.2016--8.96  ?11.8.2018--7.8  ?10.31.2019--7.9  ?8.11.2020--15.5  ?  ?10.12.2020: MRI prostate--volume 132 ml. PIRADS 3 lesion left posterolateraal/posteromedial gland. No capsular penetration, SV/NV, bony involvement, no pelvic LA.  ?  ?11.16.2020: Fusion TRUS/Bx. (1/16 cores HG/PIN, left base lateral). All 4 cores from  ROI benign.  ?  ?         11.2.2021: PSA is 13.2 and stable. ?  ?  03/24/19 06/12/18 06/20/17 04/05/15 03/24/14 08/18/13  ?PSA  ?Total PSA 15.5 ng/dl 7.9 ng/dl 7.8 ng/dl 8.96  4.69  8.20   ?Free PSA         0.66  1.12   ?% Free PSA         14  14   ?  ?  ?8.16.2022: He has had significant lower back pain with pain radiating down his left posterior leg for quite some time.  He does have weakness on that  side.  He had an MRI about a month ago.  Dr. Cindie Laroche, the doctor that ordered the test, has retired and he has not heard the results yet. ? ?Portions of the above documentation were copied from a prior visit for review purposes only. ? ?Allergies: ?Allergies  ?Allergen Reactions  ? Bee Venom Itching, Swelling and Other (See Comments)  ?  Cough  ? Fish Allergy Itching, Swelling and Other (See Comments)  ?  Cough  ? Shellfish Allergy Itching, Swelling and Other (See Comments)  ?  Cough  ? ? ?PMH: ?Past Medical History:  ?Diagnosis Date  ? Arthritis   ? Asthma   ? COPD (chronic obstructive pulmonary disease) ([place])   ? Diabetes mellitus without complication ([place])   ? Hypertension   ? ? ?PSH: ?Past Surgical History:  ?Procedure Laterality Date  ? ANKLE SURGERY    ? Arm surgery    ? COLONOSCOPY N/A 11/22/2016  ? Procedure: COLONOSCOPY;  Surgeon: Rogene Houston, MD;  [facility];  Service: Endoscopy;  Laterality: N/A;  930  ? PENILE ADHESIONS LYSIS    ? ? ?SH: ?Social History  ? ?Tobacco Use  ? Smoking status: Former  ?  Packs/day: 0.00  ?  Years: 58.00  ?  Pack years: 0.00  ?  Types: Cigarettes  ?  Quit date: 08/31/2018  ?  Years since quitting: 3.3  ? Smokeless tobacco: Never  ?Vaping Use  ? Vaping Use: Never used  ?Substance Use Topics  ? Alcohol use: Yes  ?  Comment: occasionally   ? Drug use: No  ? ? ?ROS: ?See HPI ? ?PE: ?BP (!) 143/90   Pulse 89   Ht _0  (1.702 m)   Wt 140 lb (63.5 kg)   BMI 21.93 kg/m?  ?GENERAL APPEARANCE:  Well appearing, well developed, well nourished, NAD ?HEENT:  Atraumatic, normocephalic ?NECK:  Supple. Trachea midline ?ABDOMEN:  Soft, non-tender ?Foley intact and skin in good condition. Leg clamp/tubing indicates significant tension on the urethral meatus.  Leg clamp and tubing adjusted to relieve this during office visit.  Urine in the leg bag appears to have blood in it.  This clears easily with irrigation. ?EXTREMITIES:  Moves all extremities well, without clubbing,  cyanosis, or edema ?NEUROLOGIC:  Alert and oriented x 3 ?MENTAL STATUS:  appropriate ? ? ?Results: ?Laboratory Data: ?Lab Results  ?Component Value Date  ? WBC 6.5 12/18/2021  ? HGB 13.9 12/18/2021  ? HCT 42.8 12/18/2021  ? MCV 91.3 12/18/2021  ? PLT 210 12/18/2021  ? ? ?Lab Results  ?Component Value Date  ? CREATININE 0.96 12/18/2021  ? ? ?Lab Results  ?Component Value Date  ? PSA 10.7 (H) 12/29/2019  ? ? ? ?Urinalysis ?   ?Component Value Date/Time  ? COLORURINE YELLOW 12/18/2021 1451  ? APPEARANCEUR CLEAR 12/18/2021 1451  ?  APPEARANCEUR Clear 03/28/2021 1524  ? LABSPEC 1.010 12/18/2021 1451  ? PHURINE 5.0 12/18/2021 1451  ? GLUCOSEU NEGATIVE 12/18/2021 1451  ? HGBUR SMALL (A) 12/18/2021 1451  ? BILIRUBINUR NEGATIVE 12/18/2021 1451  ? BILIRUBINUR Negative 03/28/2021 1524  ? KETONESUR NEGATIVE 12/18/2021 1451  ? PROTEINUR NEGATIVE 12/18/2021 1451  ? UROBILINOGEN 0.2 12/29/2019 1110  ? NITRITE NEGATIVE 12/18/2021 1451  ? LEUKOCYTESUR NEGATIVE 12/18/2021 1451  ? ? ?Lab Results  ?Component Value Date  ? LABMICR See below: 03/28/2021  ? [place] None seen 03/28/2021  ? LABEPIT None seen 03/28/2021  ? BACTERIA RARE (A) 12/18/2021  ? ? ?Pertinent Imaging: ? ?No results found for this or any previous visit. ? ?No results found for this or any previous visit. ? ?No results found for this or any previous visit. ? ?No results found for this or any previous visit. ? ?No results found for this or any previous visit. ? ?No results found for this or any previous visit. ? ?No results found for this or any previous visit. ? ?Results for orders placed during the hospital encounter of 10/19/16 ? ?CT RENAL STONE STUDY ? ?Narrative ?CLINICAL DATA:  Gross hematuria ? ?EXAM: ?CT ABDOMEN AND PELVIS WITHOUT CONTRAST ? ?TECHNIQUE: ?Multidetector CT imaging of the abdomen and pelvis was performed ?following the standard protocol without IV contrast. ? ?COMPARISON:  12/17/2014 ? ?FINDINGS: ?Lower chest: Streaky bibasilar atelectasis and  scarring changes. No ?infiltrates or worrisome pulmonary lesions. No pleural effusion. The ?heart is normal in size. The distal esophagus is grossly normal. ? ?Hepatobiliary: No focal hepatic lesions other than a few sca

## 2021-12-20 NOTE — Progress Notes (Signed)
Flushed patients catheter with 126ml of water with return.  Changed the catheter bag and readjusted the anchor.  Patient was given an overnight bag and instructed to how to change the bags. ? ?Arnice Vanepps, CMA ?

## 2021-12-22 ENCOUNTER — Telehealth: Payer: Self-pay

## 2021-12-22 ENCOUNTER — Ambulatory Visit (INDEPENDENT_AMBULATORY_CARE_PROVIDER_SITE_OTHER): Payer: Medicare Other | Admitting: Urology

## 2021-12-22 DIAGNOSIS — R339 Retention of urine, unspecified: Secondary | ICD-10-CM

## 2021-12-22 DIAGNOSIS — Z436 Encounter for attention to other artificial openings of urinary tract: Secondary | ICD-10-CM | POA: Diagnosis not present

## 2021-12-22 DIAGNOSIS — R310 Gross hematuria: Secondary | ICD-10-CM | POA: Diagnosis not present

## 2021-12-22 LAB — URINALYSIS, ROUTINE W REFLEX MICROSCOPIC
Bilirubin, UA: NEGATIVE
Glucose, UA: NEGATIVE
Ketones, UA: NEGATIVE
Nitrite, UA: NEGATIVE
Specific Gravity, UA: 1.015 (ref 1.005–1.030)
Urobilinogen, Ur: 0.2 mg/dL (ref 0.2–1.0)
pH, UA: 7 (ref 5.0–7.5)

## 2021-12-22 LAB — MICROSCOPIC EXAMINATION
Bacteria, UA: NONE SEEN
Epithelial Cells (non renal): NONE SEEN /hpf (ref 0–10)
RBC, Urine: >30 /hpf — AB (ref 0–2)
Renal Epithel, UA: NONE SEEN /hpf

## 2021-12-22 MED ORDER — NITROFURANTOIN MONOHYD MACRO 100 MG PO CAPS: 100.0000 mg | ORAL_CAPSULE | Freq: Two times a day (BID) | ORAL | 0 refills | Status: DC

## 2021-12-22 NOTE — Telephone Encounter (Signed)
I returned patient call. ? ?Patient reports he has increase bleeding from catheter. Patient will come in today for catheter check. ? ?Discussed with patient to reach out to PCP for pain medication that patient reports he takes chronically.  ?

## 2021-12-22 NOTE — Progress Notes (Signed)
Cath Change/ Replacement  Patient is present today for a catheter change due to urinary retention.  41ml of water was removed from the balloon, a 16FR foley cath was removed with out difficulty.  Patient was cleaned and prepped in a sterile fashion with betadine. A 16 FR coude foley cath was replaced into the bladder no complications were noted.  Catheter was flushed with 71ml of sterile water with return.  Urine was red in color. The balloon was filled with 56ml of sterile water. A leg bag was attached for drainage.  A night bag was also given to the patient and patient was given instruction on how to change from one bag to another. Patient was given proper instruction on catheter care.    Performed by: Levi Aland, CMA  Follow up: Keep next scheduled apt

## 2021-12-22 NOTE — Telephone Encounter (Signed)
Patient called advising that he needed pain medication and antibiotic called in. I advised patient that the previous prescription for below medications were prescribed by another PA. He advised he no longer see no longer seen this PA. I advised patient that  a message would be sent and someone would be in contact with him regard ing same. Patient voiced understanding.  ? ?oxyCODONE (ROXICODONE) 5 MG immediate release tablet ? ? ?oxyCODONE-acetaminophen (PERCOCET/ROXICET) 5-325 MG tablet ? ? ?Pharmacy: ? ?[place], [place] ? ? ?

## 2021-12-25 ENCOUNTER — Ambulatory Visit: Payer: Medicare Other | Admitting: Physician Assistant

## 2021-12-25 LAB — URINE CULTURE

## 2021-12-27 ENCOUNTER — Ambulatory Visit (INDEPENDENT_AMBULATORY_CARE_PROVIDER_SITE_OTHER): Payer: Medicare Other | Admitting: Physician Assistant

## 2021-12-27 VITALS — BP 144/98 | HR 75 | Ht 67.0 in | Wt 140.0 lb

## 2021-12-27 DIAGNOSIS — N401 Enlarged prostate with lower urinary tract symptoms: Secondary | ICD-10-CM | POA: Diagnosis not present

## 2021-12-27 DIAGNOSIS — R310 Gross hematuria: Secondary | ICD-10-CM

## 2021-12-27 DIAGNOSIS — N138 Other obstructive and reflux uropathy: Secondary | ICD-10-CM | POA: Diagnosis not present

## 2021-12-27 DIAGNOSIS — R339 Retention of urine, unspecified: Secondary | ICD-10-CM | POA: Diagnosis not present

## 2021-12-27 NOTE — Progress Notes (Signed)
? ?Assessment: ?1. Urinary retention ?- CT HEMATURIA WORKUP ? ?2. BPH with urinary obstruction ? ?3. Gross hematuria ?- CT HEMATURIA WORKUP ?  ? ?Plan: ?FU in one week for UA/PVR and continue Flomax. Complete Macrobid Rx. Discussion with the patient regarding his gross hematuria including the implications and differential diagnoses associated with it.  I also discussed recommendations for further evaluation including the rationale for upper tract imaging and cystoscopy.  I discussed the nature of these procedures including potential risk and complications.  The patient expressed an understanding of these issues. He will FU with Dr. Diona Fanti after CT for cysto to complete hematuria eval. ED precautions discussed for retention.  ?  ? ?Chief Complaint: ?Voiding trial and gross hematuria ? ?HPI: ?Ronald Ward is a 76 y.o. male who presents for continued evaluation of urinary retention and persistent gross hematuria. Pt had issues with cath on 5/12 and came in for change and irrigation. Hematuria noted then. Culture on 5/12 grew lactobacillus 25-50colonies. Pt tx with Macrobid. Pt noted gross hematuria again yesterday with a few clots. Also noted today. Urine clear at other times.Pt is a life long smoker since the age of 5-6. Voiding trial successful ? ?12/20/21 ?Ronald Ward is a 76 y.o. male with a history of BPH who presents for evaluation of sudden onset acute urinary retention that started on 12/16/2021 a couple of days after he ran out of his ongoing tamsulosin prescription.  He presented to the emergency department on 12/18/2021 was found to be in acute retention with placement of Foley catheter.  His tamsulosin was refilled at that time and he has been taking it since.  Urinalysis in the ED was unremarkable.  Since insertion of the Foley, the patient has had intermittent gross hematuria and mild discharge from the urethra with some standing and burning.  He is changing the leg bag when it becomes approximately  half full of urine.  Fever, chills, nausea vomiting.  No abdominal pain. . ?  ?ED eval on 12/18/21 ?With medical history including diabetes, hypertension, COPD, BPH presents emerged part with complaints of difficulty urinating.  Patient has been unable to urinate for about 2 days, states that she have increased pain in his lower suprapubic region, he denies any flank tenderness nausea vomiting diarrhea, denies any fevers chills general body aches.  He has no other complaints.  He states that he typically can take Flomax but this is not work for him.  He is currently followed by urology. ?  ?Reviewed patient's chart followed by Dr.Dahlstedt of urology, has elevated BPH, is currently on Flomax, most recent creatinine was obtained on 12/19 of last year shows a creatinine of 0.89Presents with suprapubic tenderness, bladder scan shows greater than 1000 mL of urine, likely he has obstruction from BPH, will place Foley and continue to monitor ?  ?Drained approximately 2500 mL of urine, he is feeling much better having no complaints, UA is unremarkable, waiting for CBC and be met. ?  ?03/28/21 ?Urology OV ?He was originally referred in October, 2014 by Dr. Legrand Rams for evaluation and management of an elevated PSA. He was seen in September 2012 @ [facility] for a PSA that went from 3.7 in June 2011 to 5.3 in June 2012.  ?11.30.2012: TRUS/Bx. PSA 5.3. Prostatic volume was 38.5. PSAD 0.14. All biopsies revealed benign tissue, with foci of chronic prostatitis. At that time he was incarcerated. He was followed after that, and apparently PSA was slightly elevated, but he did  not undergo a repeat biopsy. Prior to his original referral, he had a PSA drawn by Dr. Legrand Rams that was 8.  ?PCA 3 test at his initial visit in October 2014 was positive at 52. That revealed a 30% chance of having a positive biopsy if it was to be repeated.  ?2.3.2015. Repeat TRUS/Bx. Prostatic volume was approximately 70 mL. All cores were benign.   ?Repeat PSA levels:  ?8.12.2015-4.69 (14% free)  ?8.23.2016--8.96  ?11.8.2018--7.8  ?10.31.2019--7.9  ?8.11.2020--15.5  ?  ?10.12.2020: MRI prostate--volume 132 ml. PIRADS 3 lesion left posterolateraal/posteromedial gland. No capsular penetration, SV/NV, bony involvement, no pelvic LA.  ?  ?11.16.2020: Fusion TRUS/Bx. (1/16 cores HG/PIN, left base lateral). All 4 cores from ROI benign.  ?  ?         11.2.2021: PSA is 13.2 and stable. ?  ?  03/24/19 06/12/18 06/20/17 04/05/15 03/24/14 08/18/13  ?PSA  ?Total PSA 15.5 ng/dl 7.9 ng/dl 7.8 ng/dl 8.96  4.69  8.20   ?Free PSA         0.66  1.12   ?% Free PSA         14  14   ?  ?  ?8.16.2022: He has had significant lower back pain with pain radiating down his left posterior leg for quite some time.  He does have weakness on that side.  He had an MRI about a month ago.  Dr. Cindie Laroche, the doctor that ordered the test, has retired and he has not heard the results yet. ? ? ? ?Portions of the above documentation were copied from a prior visit for review purposes only. ? ?Allergies: ?Allergies  ?Allergen Reactions  ? Bee Venom Itching, Swelling and Other (See Comments)  ?  Cough  ? Fish Allergy Itching, Swelling and Other (See Comments)  ?  Cough  ? Shellfish Allergy Itching, Swelling and Other (See Comments)  ?  Cough  ? ? ?PMH: ?Past Medical History:  ?Diagnosis Date  ? Arthritis   ? Asthma   ? COPD (chronic obstructive pulmonary disease) ([place])   ? Diabetes mellitus without complication ([place])   ? Hypertension   ? ? ?PSH: ?Past Surgical History:  ?Procedure Laterality Date  ? ANKLE SURGERY    ? Arm surgery    ? COLONOSCOPY N/A 11/22/2016  ? Procedure: COLONOSCOPY;  Surgeon: Rogene Houston, MD;  [facility];  Service: Endoscopy;  Laterality: N/A;  930  ? PENILE ADHESIONS LYSIS    ? ? ?SH: ?Social History  ? ?Tobacco Use  ? Smoking status: Former  ?  Packs/day: 0.00  ?  Years: 58.00  ?  Pack years: 0.00  ?  Types: Cigarettes  ?  Quit date: 08/31/2018  ?  Years since  quitting: 3.3  ? Smokeless tobacco: Never  ?Vaping Use  ? Vaping Use: Never used  ?Substance Use Topics  ? Alcohol use: Yes  ?  Comment: occasionally   ? Drug use: No  ? ? ?ROS: ?See HPI ? ?PE: ?BP (!) 144/98   Pulse 75   Ht '5\' 7"'  (1.702 m)   Wt 140 lb (63.5 kg)   BMI 21.93 kg/m?  ?GENERAL APPEARANCE:  Well appearing, well developed, well nourished, NAD ?HEENT:  Atraumatic, normocephalic ?NECK:  Supple. Trachea midline ?ABDOMEN:  Soft, non-tender, no masses ?EXTREMITIES:  Moves all extremities well, without clubbing, cyanosis, or edema ?NEUROLOGIC:  Alert and oriented x 3, normal gait, CN II-XII grossly intact ?MENTAL STATUS:  appropriate ?BACK:  Non-tender to  palpation, No CVAT ?SKIN:  Warm, dry, and intact ? ? ?Results: ?Laboratory Data: ?Lab Results  ?Component Value Date  ? WBC 6.5 12/18/2021  ? HGB 13.9 12/18/2021  ? HCT 42.8 12/18/2021  ? MCV 91.3 12/18/2021  ? PLT 210 12/18/2021  ? ? ?Lab Results  ?Component Value Date  ? CREATININE 0.96 12/18/2021  ? ? ?Lab Results  ?Component Value Date  ? PSA 10.7 (H) 12/29/2019  ? ? ?No results found for: TESTOSTERONE ? ?No results found for: HGBA1C ? ?Urinalysis ?   ?Component Value Date/Time  ? COLORURINE YELLOW 12/18/2021 1451  ? APPEARANCEUR Hazy (A) 12/22/2021 1331  ? LABSPEC 1.010 12/18/2021 1451  ? PHURINE 5.0 12/18/2021 1451  ? GLUCOSEU Negative 12/22/2021 1331  ? HGBUR SMALL (A) 12/18/2021 1451  ? BILIRUBINUR Negative 12/22/2021 1331  ? KETONESUR NEGATIVE 12/18/2021 1451  ? PROTEINUR 2+ (A) 12/22/2021 1331  ? PROTEINUR NEGATIVE 12/18/2021 1451  ? UROBILINOGEN 0.2 12/29/2019 1110  ? NITRITE Negative 12/22/2021 1331  ? NITRITE NEGATIVE 12/18/2021 1451  ? LEUKOCYTESUR Trace (A) 12/22/2021 1331  ? LEUKOCYTESUR NEGATIVE 12/18/2021 1451  ? ? ?Lab Results  ?Component Value Date  ? LABMICR See below: 12/22/2021  ? WBCUA 0-5 12/22/2021  ? LABEPIT None seen 12/22/2021  ? BACTERIA None seen 12/22/2021  ? ? ?Pertinent Imaging: ? ?No results found for this or any previous  visit. ? ?No results found for this or any previous visit. ? ?No results found for this or any previous visit. ? ?No results found for this or any previous visit. ? ?No results found for this or any previous vis

## 2021-12-27 NOTE — Progress Notes (Signed)
Fill and Pull Catheter Removal ? ?Patient is present today for a catheter removal.  Patient was cleaned and prepped in a sterile fashion 161ml of sterile water/ saline was instilled into the bladder when the patient felt the urge to urinate. 71ml of water was then drained from the balloon.  A 16FR coude foley cath was removed from the bladder no complications were noted .  Patient as then given some time to void on their own.  Patient can void  121ml on their own after some time.  Patient tolerated well. ? ?Performed by: Levi Aland, CMA ? ?Follow up/ Additional notes: Follow up as scheduled.   ?

## 2022-01-02 ENCOUNTER — Ambulatory Visit (HOSPITAL_COMMUNITY): Payer: Medicare Other

## 2022-01-03 ENCOUNTER — Ambulatory Visit: Payer: Medicare Other | Admitting: Physician Assistant

## 2022-01-10 ENCOUNTER — Ambulatory Visit (HOSPITAL_COMMUNITY)
Admission: RE | Admit: 2022-01-10 | Discharge: 2022-01-10 | Disposition: A | Discharge status: Home / Self Care | Payer: Medicare Other | Source: Ambulatory Visit | Attending: Physician Assistant | Admitting: Physician Assistant

## 2022-01-10 DIAGNOSIS — R339 Retention of urine, unspecified: Secondary | ICD-10-CM | POA: Insufficient documentation

## 2022-01-10 DIAGNOSIS — R310 Gross hematuria: Secondary | ICD-10-CM | POA: Diagnosis not present

## 2022-01-10 DIAGNOSIS — R319 Hematuria, unspecified: Secondary | ICD-10-CM | POA: Diagnosis not present

## 2022-01-10 MED ORDER — IOHEXOL 300 MG/ML  SOLN
100.0000 mL | Freq: Once | INTRAMUSCULAR | Status: AC | PRN
  Administered 2022-01-10: 100 mL via INTRAVENOUS

## 2022-01-12 ENCOUNTER — Telehealth: Payer: Self-pay

## 2022-01-12 NOTE — Telephone Encounter (Signed)
Patients wife called advising that he needed a refill on medication.    Medication: tamsulosin (FLOMAX) 0.4 MG CAPS capsule   Pharmacy: [place], [place]

## 2022-01-12 NOTE — Telephone Encounter (Signed)
Returned call and patient made aware that prescription was sent in with refills and he just need to notify pharmacy. Patient voiced understanding.

## 2022-01-16 ENCOUNTER — Other Ambulatory Visit: Payer: Medicare Other | Admitting: Urology

## 2022-01-20 ENCOUNTER — Emergency Department (HOSPITAL_COMMUNITY)
Admission: EM | Admit: 2022-01-20 | Discharge: 2022-01-20 | Disposition: A | Discharge status: Home / Self Care | Payer: Medicare Other | Attending: Emergency Medicine | Admitting: Emergency Medicine

## 2022-01-20 ENCOUNTER — Other Ambulatory Visit: Payer: Self-pay

## 2022-01-20 ENCOUNTER — Emergency Department (HOSPITAL_COMMUNITY): Payer: Medicare Other

## 2022-01-20 ENCOUNTER — Encounter (HOSPITAL_COMMUNITY): Payer: Self-pay | Admitting: Emergency Medicine

## 2022-01-20 DIAGNOSIS — Z7984 Long term (current) use of oral hypoglycemic drugs: Secondary | ICD-10-CM | POA: Insufficient documentation

## 2022-01-20 DIAGNOSIS — R0781 Pleurodynia: Secondary | ICD-10-CM

## 2022-01-20 DIAGNOSIS — R079 Chest pain, unspecified: Secondary | ICD-10-CM | POA: Diagnosis not present

## 2022-01-20 DIAGNOSIS — R059 Cough, unspecified: Secondary | ICD-10-CM | POA: Insufficient documentation

## 2022-01-20 MED ORDER — DM-GUAIFENESIN ER 30-600 MG PO TB12: 1.0000 | ORAL_TABLET | Freq: Two times a day (BID) | ORAL | 0 refills | Status: DC

## 2022-01-20 MED ORDER — ALBUTEROL SULFATE HFA 108 (90 BASE) MCG/ACT IN AERS
1.0000 | INHALATION_SPRAY | Freq: Four times a day (QID) | RESPIRATORY_TRACT | 0 refills | Status: DC | PRN
Start: 2022-01-20 — End: 2022-10-01

## 2022-01-20 MED ORDER — OXYCODONE HCL 5 MG PO TABS: 5.0000 mg | ORAL_TABLET | Freq: Four times a day (QID) | ORAL | 0 refills | Status: DC | PRN
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
## 2022-01-20 NOTE — Discharge Instructions (Addendum)
You have been seen and discharged from the emergency department.  Your XR shows fractures but no complications or pneumonia. Use inhaler and cough medicine as needed.  Take stronger pain medicine as needed.  Do not mix this medication with alcohol or other sedating medications. Do not drive or do heavy physical activity until you know how this medication affects you.  It may cause drowsiness.  Follow-up with your primary provider for further evaluation and further care. Take home medications as prescribed. If you have any worsening symptoms or further concerns for your health please return to an emergency department for further evaluation.
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
## 2022-01-22 ENCOUNTER — Emergency Department (HOSPITAL_COMMUNITY)
Admission: EM | Admit: 2022-01-22 | Discharge: 2022-01-22 | Disposition: A | Discharge status: Home / Self Care | Payer: Medicare Other | Attending: Emergency Medicine | Admitting: Emergency Medicine

## 2022-01-22 ENCOUNTER — Other Ambulatory Visit: Payer: Self-pay

## 2022-01-22 ENCOUNTER — Encounter (HOSPITAL_COMMUNITY): Payer: Self-pay | Admitting: Emergency Medicine

## 2022-01-22 DIAGNOSIS — Z7951 Long term (current) use of inhaled steroids: Secondary | ICD-10-CM | POA: Insufficient documentation

## 2022-01-22 DIAGNOSIS — I10 Essential (primary) hypertension: Secondary | ICD-10-CM | POA: Insufficient documentation

## 2022-01-22 DIAGNOSIS — J45909 Unspecified asthma, uncomplicated: Secondary | ICD-10-CM | POA: Diagnosis not present

## 2022-01-22 DIAGNOSIS — S71151A Open bite, right thigh, initial encounter: Secondary | ICD-10-CM | POA: Diagnosis not present

## 2022-01-22 DIAGNOSIS — R5381 Other malaise: Secondary | ICD-10-CM | POA: Diagnosis not present

## 2022-01-22 DIAGNOSIS — J449 Chronic obstructive pulmonary disease, unspecified: Secondary | ICD-10-CM | POA: Diagnosis not present

## 2022-01-22 DIAGNOSIS — W57XXXA Bitten or stung by nonvenomous insect and other nonvenomous arthropods, initial encounter: Secondary | ICD-10-CM | POA: Insufficient documentation

## 2022-01-22 DIAGNOSIS — S70261A Insect bite (nonvenomous), right hip, initial encounter: Secondary | ICD-10-CM | POA: Diagnosis not present

## 2022-01-22 DIAGNOSIS — Z743 Need for continuous supervision: Secondary | ICD-10-CM | POA: Diagnosis not present

## 2022-01-22 DIAGNOSIS — Z7984 Long term (current) use of oral hypoglycemic drugs: Secondary | ICD-10-CM | POA: Diagnosis not present

## 2022-01-22 DIAGNOSIS — S79911A Unspecified injury of right hip, initial encounter: Secondary | ICD-10-CM | POA: Diagnosis present

## 2022-01-22 DIAGNOSIS — E119 Type 2 diabetes mellitus without complications: Secondary | ICD-10-CM | POA: Diagnosis not present

## 2022-01-22 DIAGNOSIS — R69 Illness, unspecified: Secondary | ICD-10-CM | POA: Diagnosis not present

## 2022-01-22 MED ORDER — TRIAMCINOLONE ACETONIDE 0.1 % EX CREA: 1.0000 "application " | TOPICAL_CREAM | Freq: Two times a day (BID) | CUTANEOUS | 0 refills | Status: DC

## 2022-01-22 NOTE — ED Triage Notes (Signed)
Pt presents via [facility] for insect bite on right side of abdomen, pt didn't notice spot on yesterday when he was a evaluated for pain in this ED.

## 2022-01-22 NOTE — ED Provider Notes (Signed)
[facility] Provider Note   CSN: [id number] Arrival date & time: 01/22/22  1417     History  Chief Complaint  Patient presents with   Insect Bite    Ronald Ward is a 76 y.o. male.  HPI     Ronald Ward is a 76 y.o. male with past medical history of type 2 diabetes, COPD, hypertension, asthma who presents to the Emergency Department via EMS for evaluation of an insect bite to the right abdomen.  He was seen here and evaluated 2 days ago for rib pain.  Has pain medication at home.  He was unsure if he had been bitten by a tick or spider.  Did not see the insect.  Noticed itching of his right flank area and today, he noticed the area was "hard and red with a scab in the middle."  He denies fever, chills, he endorses having significant itching of the area.  Home Medications Prior to Admission medications   Medication Sig Start Date End Date Taking? Authorizing Provider  albuterol (VENTOLIN HFA) 108 (90 Base) MCG/ACT inhaler Inhale 1-2 puffs into the lungs every 6 (six) hours as needed for wheezing or shortness of breath. 01/20/22   Horton, Alvin Critchley, DO  cholecalciferol (VITAMIN D) 25 MCG (1000 UNIT) tablet Take 5,000 Units by mouth daily.    [provider]  dextromethorphan-guaiFENesin (MUCINEX DM) 30-600 MG 12hr tablet Take 1 tablet by mouth 2 (two) times daily. 01/20/22   Horton, Kristie M, DO  enalapril (VASOTEC) 2.5 MG tablet Take 1 tablet by mouth daily. 08/29/18   [provider]  EPINEPHrine 0.3 mg/0.3 mL IJ SOAJ injection Inject 0.3 mLs (0.3 mg total) into the muscle once as needed (for allergic reaction). 04/08/16   Francine Graven, DO  metFORMIN (GLUCOPHAGE) 500 MG tablet Take 500 mg by mouth daily with breakfast.    [provider]  oxyCODONE (ROXICODONE) 5 MG immediate release tablet Take 1 tablet (5 mg total) by mouth every 6 (six) hours as needed for severe pain. 01/20/22   Horton, Drue Dun M, DO  simvastatin (ZOCOR) 20 MG  tablet Take 20 mg by mouth every evening.    [provider]  tamsulosin (FLOMAX) 0.4 MG CAPS capsule Take 1 capsule (0.4 mg total) by mouth daily. 12/20/21   Summerlin, Berneice Heinrich, PA-C      Allergies    Bee venom, Fish allergy, and Shellfish allergy    Review of Systems   Review of Systems  Constitutional:  Negative for chills and fever.  Cardiovascular:  Negative for chest pain.  Gastrointestinal:  Negative for abdominal pain, nausea and vomiting.  Genitourinary:  Negative for dysuria and flank pain.  Musculoskeletal:  Negative for arthralgias and myalgias.  Skin:  Positive for color change. Negative for rash.       Insect bite right abdomen  Neurological:  Negative for weakness and numbness.    Physical Exam Updated Vital Signs BP 122/71 (BP Location: Right Arm)   Pulse 72   Temp 98.3 F (36.8 C) (Oral)   Resp 17   Ht 5\' 7"  (1.702 m)   Wt 63.5 kg   SpO2 92%   BMI 21.93 kg/m  Physical Exam Vitals and nursing note reviewed.  Constitutional:      General: He is not in acute distress.    Appearance: Normal appearance. He is not ill-appearing.  Cardiovascular:     Rate and Rhythm: Normal rate and regular rhythm.  Pulses: Normal pulses.  Pulmonary:     Breath sounds: Normal breath sounds.  Musculoskeletal:        General: Normal range of motion.  Skin:    General: Skin is warm.     Capillary Refill: Capillary refill takes less than 2 seconds.     Comments: Single, slightly raised papule to the right hip.  Some surrounding induration.  No fluctuance or drainage.  No pustules or vesicles.    Neurological:     General: No focal deficit present.     Mental Status: He is alert.     ED Results / Procedures / Treatments   Labs (all labs ordered are listed, but only abnormal results are displayed) Labs Reviewed - No data to display  EKG None  Radiology No results found.  Procedures Procedures    Medications Ordered in ED Medications - No  data to display  ED Course/ Medical Decision Making/ A&P                           Medical Decision Making  Patient here requesting evaluation of insect bite of the right hip area.  He was seen here 2 days ago for right rib pain.  Reports itching of the area and associated redness.  On exam, patient has some induration to a localized area of the upper right hip.  There is a central papule noted.  No vesicles or pustules.  There is no fluctuance on exam.  I feel this is likely an insect bite.  He is actively scratching at the area.  No concerning symptoms or surrounding rash to suggest zoster. Patient agreeable to treatment plan with steroid cream, he will follow-up closely with PCP.  Appears appropriate for discharge home.           Final Clinical Impression(s) / ED Diagnoses Final diagnoses:  Insect bite of right hip, initial encounter    Rx / DC Orders ED Discharge Orders     None         Bufford Lope 01/22/22 1616    Luna Fuse, MD 01/29/22 1652

## 2022-01-22 NOTE — Discharge Instructions (Signed)
Avoid scratching the area.  Apply the cream as directed.  Follow-up with your primary care provider for recheck.

## 2022-02-20 ENCOUNTER — Encounter: Payer: Self-pay | Admitting: Family Medicine

## 2022-02-20 ENCOUNTER — Ambulatory Visit (INDEPENDENT_AMBULATORY_CARE_PROVIDER_SITE_OTHER): Payer: Medicare Other | Admitting: Family Medicine

## 2022-02-20 VITALS — BP 147/79 | HR 77 | Ht 66.0 in | Wt 132.4 lb

## 2022-02-20 DIAGNOSIS — G8929 Other chronic pain: Secondary | ICD-10-CM | POA: Diagnosis not present

## 2022-02-20 DIAGNOSIS — E559 Vitamin D deficiency, unspecified: Secondary | ICD-10-CM

## 2022-02-20 DIAGNOSIS — R7301 Impaired fasting glucose: Secondary | ICD-10-CM | POA: Diagnosis not present

## 2022-02-20 DIAGNOSIS — Z0001 Encounter for general adult medical examination with abnormal findings: Secondary | ICD-10-CM

## 2022-02-20 DIAGNOSIS — I10 Essential (primary) hypertension: Secondary | ICD-10-CM

## 2022-02-20 DIAGNOSIS — G8921 Chronic pain due to trauma: Secondary | ICD-10-CM

## 2022-02-20 DIAGNOSIS — Z1159 Encounter for screening for other viral diseases: Secondary | ICD-10-CM | POA: Diagnosis not present

## 2022-02-20 DIAGNOSIS — M25511 Pain in right shoulder: Secondary | ICD-10-CM | POA: Diagnosis not present

## 2022-02-20 MED ORDER — OXYCODONE HCL 5 MG PO TABS: 5.0000 mg | ORAL_TABLET | Freq: Four times a day (QID) | ORAL | 0 refills | Status: DC | PRN
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
## 2022-02-20 NOTE — Patient Instructions (Addendum)
I appreciate the opportunity to provide care to you today!    Follow up:  3 months  Labs: please stop by the lab today to get your blood drawn (CBC, CMP, TSH, Lipid profile, HgA1c, Vit D)  Screening: Hep C Please pick up your medication at the pharmacy  Referral: pain clinic   Please continue to a heart-healthy diet and increase your physical activities. Try to exercise for 34mins at least three times a week.      It was a pleasure to see you and I look forward to continuing to work together on your health and well-being. Please do not hesitate to call the office if you need care or have questions about your care.   Have a wonderful day and week. With Gratitude, Alvira Monday MSN, FNP-BC
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
## 2022-02-20 NOTE — Assessment & Plan Note (Signed)
Elevated in the clinic Notes to not taking his BP meds today because he was in pain Reports BP is well controlled when taking medications

## 2022-02-22 DIAGNOSIS — Z1329 Encounter for screening for other suspected endocrine disorder: Secondary | ICD-10-CM | POA: Diagnosis not present

## 2022-02-22 DIAGNOSIS — E559 Vitamin D deficiency, unspecified: Secondary | ICD-10-CM | POA: Diagnosis not present

## 2022-02-22 DIAGNOSIS — R7301 Impaired fasting glucose: Secondary | ICD-10-CM | POA: Diagnosis not present

## 2022-02-22 DIAGNOSIS — Z0001 Encounter for general adult medical examination with abnormal findings: Secondary | ICD-10-CM | POA: Diagnosis not present

## 2022-02-22 DIAGNOSIS — E1169 Type 2 diabetes mellitus with other specified complication: Secondary | ICD-10-CM | POA: Diagnosis not present

## 2022-02-22 DIAGNOSIS — R7303 Prediabetes: Secondary | ICD-10-CM | POA: Diagnosis not present

## 2022-02-22 DIAGNOSIS — I10 Essential (primary) hypertension: Secondary | ICD-10-CM | POA: Diagnosis not present

## 2022-02-22 DIAGNOSIS — Z1159 Encounter for screening for other viral diseases: Secondary | ICD-10-CM | POA: Diagnosis not present

## 2022-02-23 LAB — LIPID PANEL
Chol/HDL Ratio: 2.7 ratio (ref 0.0–5.0)
Cholesterol, Total: 160 mg/dL (ref 100–199)
HDL: 59 mg/dL (ref >39)
LDL Chol Calc (NIH): 82 mg/dL (ref 0–99)
Triglycerides: 108 mg/dL (ref 0–149)
VLDL Cholesterol Cal: 19 mg/dL (ref 5–40)

## 2022-02-23 LAB — CMP14+EGFR
ALT: 8 IU/L (ref 0–44)
AST: 13 IU/L (ref 0–40)
Albumin/Globulin Ratio: 1.4 (ref 1.2–2.2)
Albumin: 3.9 g/dL (ref 3.8–4.8)
Alkaline Phosphatase: 101 IU/L (ref 44–121)
BUN/Creatinine Ratio: 17 (ref 10–24)
BUN: 21 mg/dL (ref 8–27)
Bilirubin Total: 0.7 mg/dL (ref 0.0–1.2)
CO2: 23 mmol/L (ref 20–29)
Calcium: 9.5 mg/dL (ref 8.6–10.2)
Chloride: 102 mmol/L (ref 96–106)
Creatinine, Ser: 1.25 mg/dL (ref 0.76–1.27)
Globulin, Total: 2.8 g/dL (ref 1.5–4.5)
Glucose: 99 mg/dL (ref 70–99)
Potassium: 4.3 mmol/L (ref 3.5–5.2)
Sodium: 140 mmol/L (ref 134–144)
Total Protein: 6.7 g/dL (ref 6.0–8.5)
eGFR: 59 mL/min/{1.73_m2} — ABNORMAL LOW (ref >59)

## 2022-02-23 LAB — CBC WITH DIFFERENTIAL/PLATELET
Basophils Absolute: 0 10*3/uL (ref 0.0–0.2)
Basos: 1 %
EOS (ABSOLUTE): 0 10*3/uL (ref 0.0–0.4)
Eos: 1 %
Hematocrit: 39.8 % (ref 37.5–51.0)
Hemoglobin: 13.4 g/dL (ref 13.0–17.7)
Immature Grans (Abs): 0 10*3/uL (ref 0.0–0.1)
Immature Granulocytes: 0 %
Lymphocytes Absolute: 1.8 10*3/uL (ref 0.7–3.1)
Lymphs: 41 %
MCH: 30 pg (ref 26.6–33.0)
MCHC: 33.7 g/dL (ref 31.5–35.7)
MCV: 89 fL (ref 79–97)
Monocytes Absolute: 0.5 10*3/uL (ref 0.1–0.9)
Monocytes: 11 %
Neutrophils Absolute: 2 10*3/uL (ref 1.4–7.0)
Neutrophils: 46 %
Platelets: 174 10*3/uL (ref 150–450)
RBC: 4.46 x10E6/uL (ref 4.14–5.80)
RDW: 12.7 % (ref 11.6–15.4)
WBC: 4.3 10*3/uL (ref 3.4–10.8)

## 2022-02-23 LAB — HEPATITIS C ANTIBODY: Hep C Virus Ab: NONREACTIVE

## 2022-02-23 LAB — HEMOGLOBIN A1C
Est. average glucose Bld gHb Est-mCnc: 134 mg/dL
Hgb A1c MFr Bld: 6.3 % — ABNORMAL HIGH (ref 4.8–5.6)

## 2022-02-23 LAB — VITAMIN D 25 HYDROXY (VIT D DEFICIENCY, FRACTURES): Vit D, 25-Hydroxy: 46.7 ng/mL (ref 30.0–100.0)

## 2022-02-23 LAB — TSH+T4F+T3FREE
Free T4: 1.28 ng/dL (ref 0.82–1.77)
T3, Free: 3.4 pg/mL (ref 2.0–4.4)
TSH: 0.814 u[IU]/mL (ref 0.450–4.500)

## 2022-02-25 NOTE — Progress Notes (Signed)
Please inform the patient that he has prediabetes. I recommend low calorie and fat diet. All other labs are within normal limits.

## 2022-02-27 DIAGNOSIS — B351 Tinea unguium: Secondary | ICD-10-CM | POA: Diagnosis not present

## 2022-02-27 DIAGNOSIS — L84 Corns and callosities: Secondary | ICD-10-CM | POA: Diagnosis not present

## 2022-02-27 DIAGNOSIS — M79676 Pain in unspecified toe(s): Secondary | ICD-10-CM | POA: Diagnosis not present

## 2022-02-27 DIAGNOSIS — E1142 Type 2 diabetes mellitus with diabetic polyneuropathy: Secondary | ICD-10-CM | POA: Diagnosis not present

## 2022-03-27 ENCOUNTER — Ambulatory Visit (INDEPENDENT_AMBULATORY_CARE_PROVIDER_SITE_OTHER): Payer: Medicare Other | Admitting: Urology

## 2022-03-27 ENCOUNTER — Encounter: Payer: Self-pay | Admitting: Urology

## 2022-03-27 VITALS — BP 128/77 | HR 80 | Ht 66.0 in | Wt 133.6 lb

## 2022-03-27 DIAGNOSIS — R972 Elevated prostate specific antigen [PSA]: Secondary | ICD-10-CM

## 2022-03-27 DIAGNOSIS — N401 Enlarged prostate with lower urinary tract symptoms: Secondary | ICD-10-CM | POA: Diagnosis not present

## 2022-03-27 DIAGNOSIS — R310 Gross hematuria: Secondary | ICD-10-CM | POA: Diagnosis not present

## 2022-03-27 DIAGNOSIS — R339 Retention of urine, unspecified: Secondary | ICD-10-CM | POA: Diagnosis not present

## 2022-03-27 DIAGNOSIS — N138 Other obstructive and reflux uropathy: Secondary | ICD-10-CM | POA: Diagnosis not present

## 2022-03-27 LAB — BLADDER SCAN AMB NON-IMAGING: Scan Result: 144

## 2022-03-27 NOTE — Progress Notes (Signed)
post void residual =130mL

## 2022-03-27 NOTE — Progress Notes (Signed)
History of Present Illness: Here for follow-up of several issues.  BPH    He has been on tamsulosin.  In May 2023 he went into acute urinary retention.  This happened after he ran out of his tamsulosin.  He subsequently passed a voiding trial here on 17 May, 2023.  8.15.2023: He is back on tamsulosin.  He is voiding adequately.  He denies any gross hematuria or dysuria no side effects.     ED   He states that he can get occasional erection.  He does not want any help with this      Elevated PSA:      He was originally referred in October, 2014 by Dr. Legrand Rams for evaluation and management of an elevated PSA. He was seen in September 2012 @ [facility] for a PSA that went from 3.7 in June 2011 to 5.3 in June 2012.  11.30.2012: TRUS/Bx. PSA 5.3. Prostatic volume was 38.5. PSAD 0.14. All biopsies revealed benign tissue, with foci of chronic prostatitis. At that time he was incarcerated. He was followed after that, and apparently PSA was slightly elevated, but he did not undergo a repeat biopsy. Prior to his original referral, he had a PSA drawn by Dr. Legrand Rams that was 8.  PCA 3 test at his initial visit in October 2014 was positive at 69. That revealed a 30% chance of having a positive biopsy if it was to be repeated.  2.3.2015. Repeat TRUS/Bx. Prostatic volume was approximately 70 mL. All cores were benign.  Repeat PSA levels:  8.12.2015-4.69 (14% free)  8.23.2016--8.96  11.8.2018--7.8  10.31.2019--7.9  8.11.2020--15.5    10.12.2020: MRI prostate--volume 132 ml. PIRADS 3 lesion left posterolateraal/posteromedial gland. No capsular penetration, SV/NV, bony involvement, no pelvic LA.    11.16.2020: Fusion TRUS/Bx. (1/16 cores HG/PIN, left base lateral). All 4 cores from ROI benign.             11.2.2021: PSA is 13.2 and stable.     03/24/19 06/12/18 06/20/17 04/05/15 03/24/14 08/18/13  PSA  Total PSA 15.5 ng/dl 7.9 ng/dl 7.8 ng/dl 8.96  4.69  8.20   Free PSA         0.66   1.12   % Free PSA         14  14       8.16.2022: PSA 14.8  8.15.2023: Here for routine check.  He has not had a PSA drawn recently.   Past Medical History:  Diagnosis Date   Arthritis    Asthma    COPD (chronic obstructive pulmonary disease) ([place])    Diabetes mellitus without complication ([place])    Hypertension     Past Surgical History:  Procedure Laterality Date   ANKLE SURGERY     Arm surgery     COLONOSCOPY N/A 11/22/2016   Procedure: COLONOSCOPY;  Surgeon: Rogene Houston, MD;  [facility];  Service: Endoscopy;  Laterality: N/A;  [place] Medications:  Allergies as of 03/27/2022       Reactions   Bee Venom Itching, Swelling, Other (See Comments)   Cough   Fish Allergy Itching, Swelling, Other (See Comments)   Cough   Shellfish Allergy Itching, Swelling, Other (See Comments)   Cough        Medication List        Accurate as of March 27, 2022  8:41 AM. If you have any questions, ask your nurse or  doctor.          albuterol 108 (90 Base) MCG/ACT inhaler Commonly known as: VENTOLIN HFA Inhale 1-2 puffs into the lungs every 6 (six) hours as needed for wheezing or shortness of breath.   cholecalciferol 25 MCG (1000 UNIT) tablet Commonly known as: VITAMIN D3 Take 5,000 Units by mouth daily.   dextromethorphan-guaiFENesin 30-600 MG 12hr tablet Commonly known as: MUCINEX DM Take 1 tablet by mouth 2 (two) times daily.   enalapril 2.5 MG tablet Commonly known as: VASOTEC Take 1 tablet by mouth daily.   EPINEPHrine 0.3 mg/0.3 mL Soaj injection Commonly known as: EPI-PEN Inject 0.3 mLs (0.3 mg total) into the muscle once as needed (for allergic reaction).   ibuprofen 200 MG tablet Commonly known as: ADVIL Take 200 mg by mouth every 6 (six) hours as needed.   metFORMIN 500 MG tablet Commonly known as: GLUCOPHAGE Take 500 mg by mouth daily with breakfast.   oxyCODONE 5 MG immediate release tablet Commonly  known as: Roxicodone Take 1 tablet (5 mg total) by mouth every 6 (six) hours as needed for severe pain.   simvastatin 20 MG tablet Commonly known as: ZOCOR Take 20 mg by mouth every evening.   tamsulosin 0.4 MG Caps capsule Commonly known as: FLOMAX Take 1 capsule (0.4 mg total) by mouth daily.   triamcinolone cream 0.1 % Commonly known as: KENALOG Apply 1 application  topically 2 (two) times daily.        Allergies:  Allergies  Allergen Reactions   Bee Venom Itching, Swelling and Other (See Comments)    Cough   Fish Allergy Itching, Swelling and Other (See Comments)    Cough   Shellfish Allergy Itching, Swelling and Other (See Comments)    Cough    Family History  Problem Relation Age of Onset   Heart attack Mother    Diabetes Mother    Diabetes Brother    Cancer Brother     Social History:  reports that he has been smoking cigarettes. He has a 43.50 pack-year smoking history. He has never used smokeless tobacco. He reports current alcohol use. He reports that he does not use drugs.  ROS: A complete review of systems was performed.  All systems are negative except for pertinent findings as noted.  Physical Exam:  Vital signs in last 24 hours: There were no vitals taken for this visit. Constitutional:  Alert and oriented, No acute distress Cardiovascular: Regular rate  Respiratory: Normal respiratory effort GI: Abdomen is soft, nontender, nondistended, no abdominal masses. No CVAT.  Genitourinary: Normal anal sphincter tone.  Prostate 100 mL, symmetric, nonnodular, nontender.   Psychiatric: Normal mood and affect  I have reviewed prior pt note  I have reviewed urinalysis results pyuria present  I have independently reviewed prior imaging MRI, prior prostate ultrasound  I have reviewed prior PSA results  Prior pathology results reviewed  Bladder scan reviewed    Impression/Assessment:  1.  Elevated PSA with several negative biopsies.  He does have a  very large gland.  2.  He potentially may have this year when he was out of his medications.  He is voiding fairly well on the tamsulosin  3.  Asymptomatic pyuria  Plan:  1.  I will check his PSA  2.  Continue annual follow-up

## 2022-03-28 LAB — PSA: Prostate Specific Ag, Serum: 15.8 ng/mL — ABNORMAL HIGH (ref 0.0–4.0)

## 2022-03-29 LAB — URINALYSIS, ROUTINE W REFLEX MICROSCOPIC
Bilirubin, UA: NEGATIVE
Ketones, UA: NEGATIVE
Leukocytes,UA: NEGATIVE
Nitrite, UA: NEGATIVE
Protein,UA: NEGATIVE
RBC, UA: NEGATIVE
Specific Gravity, UA: 1.02 (ref 1.005–1.030)
Urobilinogen, Ur: 0.2 mg/dL (ref 0.2–1.0)
pH, UA: 5.5 (ref 5.0–7.5)

## 2022-03-29 LAB — MICROSCOPIC EXAMINATION: RBC, Urine: NONE SEEN /hpf (ref 0–2)

## 2022-04-09 ENCOUNTER — Ambulatory Visit (INDEPENDENT_AMBULATORY_CARE_PROVIDER_SITE_OTHER): Payer: Medicare Other

## 2022-04-09 DIAGNOSIS — Z0000 Encounter for general adult medical examination without abnormal findings: Secondary | ICD-10-CM

## 2022-04-09 NOTE — Progress Notes (Signed)
Subjective:   Ronald Ward is a 76 y.o. male who presents for Medicare Annual/Subsequent preventive examination.  Review of Systems    I connected with  Ronald Ward on 04/09/22 by a audio enabled telemedicine application and verified that I am speaking with the correct person using two identifiers.  Patient Location: Home  Provider Location: Other:  office/clinic  I discussed the limitations of evaluation and management by telemedicine. The patient expressed understanding and agreed to proceed.        Objective:    There were no vitals filed for this visit. There is no height or weight on file to calculate BMI.     01/22/2022    2:43 PM 01/20/2022   11:32 AM 12/18/2021    2:27 PM 10/25/2021   12:01 PM 10/14/2021   11:56 AM 07/31/2021    1:23 PM 04/23/2021    8:18 AM  Advanced Directives  Does Patient Have a Medical Advance Directive? No No No No No No No  Would patient like information on creating a medical advance directive? No - Patient declined  No - Patient declined  No - Patient declined No - Patient declined No - Guardian declined    Current Medications (verified) Outpatient Encounter Medications as of 04/09/2022  Medication Sig   albuterol (VENTOLIN HFA) 108 (90 Base) MCG/ACT inhaler Inhale 1-2 puffs into the lungs every 6 (six) hours as needed for wheezing or shortness of breath.   cholecalciferol (VITAMIN D) 25 MCG (1000 UNIT) tablet Take 5,000 Units by mouth daily.   dextromethorphan-guaiFENesin (MUCINEX DM) 30-600 MG 12hr tablet Take 1 tablet by mouth 2 (two) times daily.   enalapril (VASOTEC) 2.5 MG tablet Take 1 tablet by mouth daily.   EPINEPHrine 0.3 mg/0.3 mL IJ SOAJ injection Inject 0.3 mLs (0.3 mg total) into the muscle once as needed (for allergic reaction).   ibuprofen (ADVIL) 200 MG tablet Take 200 mg by mouth every 6 (six) hours as needed.   metFORMIN (GLUCOPHAGE) 500 MG tablet Take 500 mg by mouth daily with breakfast.   oxyCODONE (ROXICODONE) 5 MG  immediate release tablet Take 1 tablet (5 mg total) by mouth every 6 (six) hours as needed for severe pain.   simvastatin (ZOCOR) 20 MG tablet Take 20 mg by mouth every evening.   tamsulosin (FLOMAX) 0.4 MG CAPS capsule Take 1 capsule (0.4 mg total) by mouth daily.   triamcinolone cream (KENALOG) 0.1 % Apply 1 application  topically 2 (two) times daily.   No facility-administered encounter medications on file as of 04/09/2022.    Allergies (verified) Bee venom, Fish allergy, and Shellfish allergy   History: Past Medical History:  Diagnosis Date   Arthritis    Asthma    COPD (chronic obstructive pulmonary disease) ([place])    Diabetes mellitus without complication ([place])    Hypertension    Past Surgical History:  Procedure Laterality Date   ANKLE SURGERY     Arm surgery     COLONOSCOPY N/A 11/22/2016   Procedure: COLONOSCOPY;  Surgeon: Rogene Houston, MD;  [facility];  Service: Endoscopy;  Laterality: N/A;  26   PENILE ADHESIONS LYSIS     Family History  Problem Relation Age of Onset   Heart attack Mother    Diabetes Mother    Diabetes Brother    Cancer Brother    Social History   Socioeconomic History   Marital status: Divorced    Spouse name: Not on file   Number  of children: Not on file   Years of education: Not on file   Highest education level: Not on file  Occupational History   Not on file  Tobacco Use   Smoking status: Every Day    Packs/day: 0.75    Years: 58.00    Total pack years: 43.50    Types: Cigarettes    Last attempt to quit: 08/31/2018    Years since quitting: 3.6   Smokeless tobacco: Never  Vaping Use   Vaping Use: Never used  Substance and Sexual Activity   Alcohol use: Yes    Comment: occasionally    Drug use: No   Sexual activity: Not on file  Other Topics Concern   Not on file  Social History Narrative   Not on file   Social Determinants of Health   Financial Resource Strain: Not on file  Food Insecurity: Not on file   Transportation Needs: Not on file  Physical Activity: Not on file  Stress: Not on file  Social Connections: Not on file    Tobacco Counseling Ready to quit: Not Answered Counseling given: Not Answered   Clinical Intake:    Ronald Ward , Thank you for taking time to come for your Medicare Wellness Visit. I appreciate your ongoing commitment to your health goals. Please review the following plan we discussed and let me know if I can assist you in the future.   These are the goals we discussed:  Goals   None     This is a list of the screening recommended for you and due dates:  Health Maintenance  Topic Date Due   Pneumonia Vaccine (1 - PCV) Never done   Zoster (Shingles) Vaccine (1 of 2) Never done   COVID-19 Vaccine (3 - Pfizer series) 01/06/2020   Flu Shot  03/13/2022   Tetanus Vaccine  05/17/2029   Hepatitis C Screening: USPSTF Recommendation to screen - Ages 18-79 yo.  Completed   HPV Vaccine  Aged Out   Colon Cancer Screening  Discontinued    Diabetic?no   Activities of Daily Living     No data to display           Patient Care Team: Alvira Monday, FNP as PCP - General (Family Medicine)  Indicate any recent Medical Services you may have received from other than [facility] providers in the past year (date may be approximate).     Assessment:   This is a routine wellness examination for Ronald Ward.  Hearing/Vision screen No results found.  Dietary issues and exercise activities discussed:     Goals Addressed   None   Depression Screen    02/20/2022   10:38 AM  PHQ 2/9 Scores  PHQ - 2 Score 0    Fall Risk    02/20/2022   10:38 AM  [name] in the past year? 1  Number falls in past yr: 1  Injury with Fall? 1  Risk for fall due to : No Fall Risks;Other (Comment)  Follow up Falls evaluation completed;Education provided    [place]:  Any stairs in or around the home? Yes  If so, are there any without  handrails? Yes  Home free of loose throw rugs in walkways, pet beds, electrical cords, etc? No  Adequate lighting in your home to reduce risk of falls? No   ASSISTIVE DEVICES UTILIZED TO PREVENT FALLS:  Life alert? No  Use of a cane, walker or  w/c? Yes  Grab bars in the bathroom? No  Shower chair or bench in shower? Yes  Elevated toilet seat or a handicapped toilet? No   Immunizations Immunization History  Administered Date(s) Administered   PFIZER(Purple Top)SARS-COV-2 Vaccination 10/15/2019, 11/11/2019   Tdap 09/02/2014, 05/18/2019    TDAP status: Up to date  Flu Vaccine status: Due, Education has been provided regarding the importance of this vaccine. Advised may receive this vaccine at local pharmacy or Health Dept. Aware to provide a copy of the vaccination record if obtained from local pharmacy or Health Dept. Verbalized acceptance and understanding.  Pneumococcal vaccine status: Due, Education has been provided regarding the importance of this vaccine. Advised may receive this vaccine at local pharmacy or Health Dept. Aware to provide a copy of the vaccination record if obtained from local pharmacy or Health Dept. Verbalized acceptance and understanding.  Covid-19 vaccine status: Completed vaccines  Qualifies for Shingles Vaccine? Yes   Zostavax completed No   Shingrix Completed?: No.    Education has been provided regarding the importance of this vaccine. Patient has been advised to call insurance company to determine out of pocket expense if they have not yet received this vaccine. Advised may also receive vaccine at local pharmacy or Health Dept. Verbalized acceptance and understanding.  Screening Tests Health Maintenance  Topic Date Due   Pneumonia Vaccine 68+ Years old (1 - PCV) Never done   Zoster Vaccines- Shingrix (1 of 2) Never done   COVID-19 Vaccine (3 - Pfizer series) 01/06/2020   INFLUENZA VACCINE  03/13/2022   TETANUS/TDAP  05/17/2029   Hepatitis C  Screening  Completed   HPV VACCINES  Aged Out   COLONOSCOPY (Pts 45-76yrs Insurance coverage will need to be confirmed)  Discontinued    Health Maintenance  Health Maintenance Due  Topic Date Due   Pneumonia Vaccine 38+ Years old (1 - PCV) Never done   Zoster Vaccines- Shingrix (1 of 2) Never done   COVID-19 Vaccine (3 - Pfizer series) 01/06/2020   INFLUENZA VACCINE  03/13/2022    Colorectal cancer screening: No longer required.   Lung Cancer Screening: (Low Dose CT Chest recommended if Age 50-80 years, 30 pack-year currently smoking OR have quit w/in 15years.) does qualify.   Lung Cancer Screening Referral:   Additional Screening:  Hepatitis C Screening: does qualify; Completed 02/22/22  Vision Screening: Recommended annual ophthalmology exams for early detection of glaucoma and other disorders of the eye. Is the patient up to date with their annual eye exam?  No  Who is the provider or what is the name of the office in which the patient attends annual eye exams? N/a If pt is not established with a provider, would they like to be referred to a provider to establish care? No .   Dental Screening: Recommended annual dental exams for proper oral hygiene  Community Resource Referral / Chronic Care Management: CRR required this visit?  No   CCM required this visit?  No      Plan:     I have personally reviewed and noted the following in the patient's chart:   Medical and social history Use of alcohol, tobacco or illicit drugs  Current medications and supplements including opioid prescriptions. Patient is currently taking opioid prescriptions. Information provided to patient regarding non-opioid alternatives. Patient advised to discuss non-opioid treatment plan with their provider. Functional ability and status Nutritional status Physical activity Advanced directives List of other physicians Hospitalizations, surgeries, and ER visits in previous 2  months Vitals Screenings to include cognitive, depression, and falls Referrals and appointments  In addition, I have reviewed and discussed with patient certain preventive protocols, quality metrics, and best practice recommendations. A written personalized care plan for preventive services as well as general preventive health recommendations were provided to patient.     Quentin Angst, [place]   04/09/2022

## 2022-04-09 NOTE — Patient Instructions (Signed)
  Ronald Ward , Thank you for taking time to come for your Medicare Wellness Visit. I appreciate your ongoing commitment to your health goals. Please review the following plan we discussed and let me know if I can assist you in the future.   These are the goals we discussed:  Goals      Patient Stated     None        This is a list of the screening recommended for you and due dates:  Health Maintenance  Topic Date Due   Pneumonia Vaccine (1 - PCV) Never done   Zoster (Shingles) Vaccine (1 of 2) Never done   COVID-19 Vaccine (3 - Pfizer series) 01/06/2020   Flu Shot  03/13/2022   Tetanus Vaccine  05/17/2029   Hepatitis C Screening: USPSTF Recommendation to screen - Ages 18-79 yo.  Completed   HPV Vaccine  Aged Out   Colon Cancer Screening  Discontinued

## 2022-04-23 ENCOUNTER — Encounter: Payer: Self-pay | Admitting: Family Medicine

## 2022-04-23 ENCOUNTER — Ambulatory Visit (INDEPENDENT_AMBULATORY_CARE_PROVIDER_SITE_OTHER): Payer: Medicare Other | Admitting: Family Medicine

## 2022-04-23 VITALS — BP 125/82 | HR 80 | Ht 65.0 in | Wt 135.0 lb

## 2022-04-23 DIAGNOSIS — Z23 Encounter for immunization: Secondary | ICD-10-CM

## 2022-04-23 DIAGNOSIS — B029 Zoster without complications: Secondary | ICD-10-CM | POA: Diagnosis not present

## 2022-04-23 DIAGNOSIS — G8929 Other chronic pain: Secondary | ICD-10-CM

## 2022-04-23 DIAGNOSIS — S0006XD Insect bite (nonvenomous) of scalp, subsequent encounter: Secondary | ICD-10-CM

## 2022-04-23 DIAGNOSIS — W57XXXD Bitten or stung by nonvenomous insect and other nonvenomous arthropods, subsequent encounter: Secondary | ICD-10-CM | POA: Diagnosis not present

## 2022-04-23 MED ORDER — MELOXICAM 7.5 MG PO TBDP: 1.0000 | ORAL_TABLET | Freq: Every day | ORAL | 2 refills | Status: DC

## 2022-04-23 MED ORDER — TRIAMCINOLONE ACETONIDE 0.1 % EX CREA: 1.0000 | TOPICAL_CREAM | Freq: Two times a day (BID) | CUTANEOUS | 0 refills | Status: DC

## 2022-04-23 MED ORDER — GABAPENTIN 300 MG PO CAPS: ORAL_CAPSULE | ORAL | 0 refills | Status: DC

## 2022-04-23 MED ORDER — FAMCICLOVIR 500 MG PO TABS: 500.0000 mg | ORAL_TABLET | Freq: Three times a day (TID) | ORAL | 0 refills | Status: AC

## 2022-04-23 NOTE — Patient Instructions (Addendum)
I appreciate the opportunity to provide care to you today!    Follow up:  05/23/22  Please pick up your medications at the pharmacy  Thank you for getting your pneumonia and flu vaccine   Please continue to a heart-healthy diet and increase your physical activities. Try to exercise for 6mins at least three times a week.      It was a pleasure to see you and I look forward to continuing to work together on your health and well-being. Please do not hesitate to call the office if you need care or have questions about your care.   Have a wonderful day and week. With Gratitude, Alvira Monday MSN, FNP-BC

## 2022-04-23 NOTE — Assessment & Plan Note (Signed)
Patient educated on CDC recommendation for the vaccine. Verbal consent was obtained from the patient, vaccine administered by nurse, no sign of adverse reactions noted at this time. Patient education on arm soreness and use of tylenol or ibuprofen for this patient  was discussed. Patient educated on the signs and symptoms of adverse effect and advise to contact the office if they occur.  

## 2022-04-23 NOTE — Progress Notes (Addendum)
Acute Office Visit  Subjective:     Patient ID: Ronald Ward, male    DOB: [birth year], 76 y.o.   MRN: [id number]  Chief Complaint  Patient presents with   Back Pain    Pt c/o back pain on left low back, has rash. Onset of this began 04/17/22.     HPI Patient is in today with c/o of an unilaterally rash in the left lumbar dermatome with acute neuritis. The onset of symptoms was a week ago. He c/o of severe pain from the rash that wakes him up at night. The onset of symptoms was a week ago.   Review of Systems  Constitutional:  Negative for chills and fever.  Eyes:  Negative for pain and redness.  Skin:  Positive for rash.  Psychiatric/Behavioral:  Negative for memory loss. The patient does not have insomnia.         Objective:    BP 125/82   Pulse 80   Ht 5\' 5"  (1.651 m)   Wt 135 lb 0.6 oz (61.3 kg)   SpO2 93%   BMI 22.47 kg/m    Physical Exam HENT:     Head: Normocephalic.  Cardiovascular:     Rate and Rhythm: Normal rate and regular rhythm.     Pulses: Normal pulses.     Heart sounds: Normal heart sounds.  Pulmonary:     Effort: Pulmonary effort is normal.     Breath sounds: Normal breath sounds.  Skin:    Findings: Rash (unliateral well-defined dermatomal rash) present.  Neurological:     Mental Status: He is alert.     No results found for any visits on 04/23/22.      Assessment & Plan:   Problem List Items Addressed This Visit       Other   Chronic pain   Relevant Medications   Meloxicam 7.5 MG TBDP   gabapentin (NEURONTIN) 300 MG capsule   Herpes zoster - Primary    Onset of the rash is >72 hrs; however, the patient is >46 years old and c/o herpetic neuralgia pain that wakes him up at night Will be treated with famciclovir 500 mg TID for 7 days and treat neuralgia pain with gabapentin for 3 days       Relevant Medications   famciclovir (FAMVIR) 500 MG tablet   gabapentin (NEURONTIN) 300 MG capsule   Need for immunization against  influenza    Patient educated on CDC recommendation for the vaccine. Verbal consent was obtained from the patient, vaccine administered by nurse, no sign of adverse reactions noted at this time. Patient education on arm soreness and use of tylenol or ibuprofen for this patient  was discussed. Patient educated on the signs and symptoms of adverse effect and advise to contact the office if they occur.      Other Visit Diagnoses     Immunization due       Relevant Orders   Pneumococcal conjugate vaccine 20-valent (Completed)   Flu Vaccine QUAD High Dose(Fluad) (Completed)   Insect bite of scalp, subsequent encounter       Relevant Medications   triamcinolone cream (KENALOG) 0.1 %       Meds ordered this encounter  Medications   triamcinolone cream (KENALOG) 0.1 %    Sig: Apply 1 Application topically 2 (two) times daily.    Dispense:  15 g    Refill:  0   Meloxicam 7.5 MG TBDP    Sig: Take 1  tablet by mouth daily.    Dispense:  30 tablet    Refill:  2   famciclovir (FAMVIR) 500 MG tablet    Sig: Take 1 tablet (500 mg total) by mouth 3 (three) times daily for 7 days.    Dispense:  21 tablet    Refill:  0   gabapentin (NEURONTIN) 300 MG capsule    Sig: Take gabapentin 300 mg once on day one, 300 mg twice daily on day 2, and 300 mg 3 times daily on day    Dispense:  6 capsule    Refill:  0    Return if symptoms worsen or fail to improve.  Alvira Monday, FNP

## 2022-04-23 NOTE — Assessment & Plan Note (Addendum)
Onset of the rash is >72 hrs; however, the patient is >78 years old and c/o herpetic neuralgia pain that wakes him up at night Will be treated with famciclovir 500 mg TID for 7 days and treat neuralgia pain with gabapentin for 3 days
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
## 2022-05-23 ENCOUNTER — Ambulatory Visit (INDEPENDENT_AMBULATORY_CARE_PROVIDER_SITE_OTHER): Payer: Medicare Other | Admitting: Family Medicine

## 2022-05-23 ENCOUNTER — Encounter: Payer: Self-pay | Admitting: Family Medicine

## 2022-05-23 VITALS — BP 122/76 | HR 79 | Ht 67.0 in | Wt 133.0 lb

## 2022-05-23 DIAGNOSIS — R7301 Impaired fasting glucose: Secondary | ICD-10-CM

## 2022-05-23 DIAGNOSIS — I10 Essential (primary) hypertension: Secondary | ICD-10-CM | POA: Diagnosis not present

## 2022-05-23 DIAGNOSIS — B029 Zoster without complications: Secondary | ICD-10-CM

## 2022-05-23 DIAGNOSIS — G8929 Other chronic pain: Secondary | ICD-10-CM

## 2022-05-23 DIAGNOSIS — E559 Vitamin D deficiency, unspecified: Secondary | ICD-10-CM | POA: Diagnosis not present

## 2022-05-23 DIAGNOSIS — E038 Other specified hypothyroidism: Secondary | ICD-10-CM | POA: Diagnosis not present

## 2022-05-23 DIAGNOSIS — M792 Neuralgia and neuritis, unspecified: Secondary | ICD-10-CM

## 2022-05-23 MED ORDER — CYCLOBENZAPRINE HCL 5 MG PO TABS: 5.0000 mg | ORAL_TABLET | Freq: Three times a day (TID) | ORAL | 1 refills | Status: DC | PRN

## 2022-05-23 MED ORDER — GABAPENTIN 300 MG PO CAPS: 300.0000 mg | ORAL_CAPSULE | Freq: Three times a day (TID) | ORAL | 3 refills | Status: DC

## 2022-05-23 NOTE — Patient Instructions (Signed)
I appreciate the opportunity to provide care to you today!    Follow up:  3 months  Fasting Labs: please stop by the lab during the week to get your blood drawn (CBC, CMP, TSH, Lipid profile, HgA1c, Vit D)   Please pick up your medication at the pharmacy    Please continue to a heart-healthy diet and increase your physical activities. Try to exercise for 62mins at least three times a week.      It was a pleasure to see you and I look forward to continuing to work together on your health and well-being. Please do not hesitate to call the office if you need care or have questions about your care.   Have a wonderful day and week. With Gratitude, Alvira Monday MSN, FNP-BC

## 2022-05-23 NOTE — Assessment & Plan Note (Addendum)
Denies tenderness to palpation of the thoracic and lumbar spine Will refill gabapentin 300 mg to take 3 times daily as needed for nerve pain Will order Flexeril 5 mg to take 3 times daily as needed for muscle spasm Encouraged to take Tylenol as needed for back, neck and shoulders pain Encourage heat therapy as needed to decrease pain caused by stiffness and to increase blood flow to the affected sites Encouraged to take meloxicam 7.5 mg daily for pain

## 2022-05-23 NOTE — Assessment & Plan Note (Signed)
Encouraged the patient to continue heart healthy diet with increased physical activities  BP Readings from Last 3 Encounters:  05/23/22 122/76  04/23/22 125/82  03/27/22 128/77
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
## 2022-05-24 ENCOUNTER — Other Ambulatory Visit: Payer: Self-pay | Admitting: Family Medicine

## 2022-05-24 LAB — CBC WITH DIFFERENTIAL/PLATELET
Basophils Absolute: 0 10*3/uL (ref 0.0–0.2)
Basos: 1 %
EOS (ABSOLUTE): 0.1 10*3/uL (ref 0.0–0.4)
Eos: 2 %
Hematocrit: 39.7 % (ref 37.5–51.0)
Hemoglobin: 13.4 g/dL (ref 13.0–17.7)
Immature Grans (Abs): 0 10*3/uL (ref 0.0–0.1)
Immature Granulocytes: 0 %
Lymphocytes Absolute: 1.9 10*3/uL (ref 0.7–3.1)
Lymphs: 47 %
MCH: 30 pg (ref 26.6–33.0)
MCHC: 33.8 g/dL (ref 31.5–35.7)
MCV: 89 fL (ref 79–97)
Monocytes Absolute: 0.6 10*3/uL (ref 0.1–0.9)
Monocytes: 16 %
Neutrophils Absolute: 1.3 10*3/uL — ABNORMAL LOW (ref 1.4–7.0)
Neutrophils: 34 %
Platelets: 193 10*3/uL (ref 150–450)
RBC: 4.47 x10E6/uL (ref 4.14–5.80)
RDW: 12.4 % (ref 11.6–15.4)
WBC: 4 10*3/uL (ref 3.4–10.8)

## 2022-05-24 LAB — LIPID PANEL
Chol/HDL Ratio: 2.9 ratio (ref 0.0–5.0)
Cholesterol, Total: 154 mg/dL (ref 100–199)
HDL: 54 mg/dL (ref >39)
LDL Chol Calc (NIH): 64 mg/dL (ref 0–99)
Triglycerides: 223 mg/dL — ABNORMAL HIGH (ref 0–149)
VLDL Cholesterol Cal: 36 mg/dL (ref 5–40)

## 2022-05-24 LAB — HEMOGLOBIN A1C
Est. average glucose Bld gHb Est-mCnc: 140 mg/dL
Hgb A1c MFr Bld: 6.5 % — ABNORMAL HIGH (ref 4.8–5.6)

## 2022-05-24 LAB — CMP14+EGFR
ALT: 10 IU/L (ref 0–44)
AST: 14 IU/L (ref 0–40)
Albumin/Globulin Ratio: 1.7 (ref 1.2–2.2)
Albumin: 3.9 g/dL (ref 3.8–4.8)
Alkaline Phosphatase: 114 IU/L (ref 44–121)
BUN/Creatinine Ratio: 22 (ref 10–24)
BUN: 26 mg/dL (ref 8–27)
Bilirubin Total: 0.3 mg/dL (ref 0.0–1.2)
CO2: 24 mmol/L (ref 20–29)
Calcium: 9.3 mg/dL (ref 8.6–10.2)
Chloride: 104 mmol/L (ref 96–106)
Creatinine, Ser: 1.18 mg/dL (ref 0.76–1.27)
Globulin, Total: 2.3 g/dL (ref 1.5–4.5)
Glucose: 120 mg/dL — ABNORMAL HIGH (ref 70–99)
Potassium: 4.1 mmol/L (ref 3.5–5.2)
Sodium: 141 mmol/L (ref 134–144)
Total Protein: 6.2 g/dL (ref 6.0–8.5)
eGFR: 64 mL/min/{1.73_m2} (ref >59)

## 2022-05-24 LAB — TSH+FREE T4
Free T4: 1.08 ng/dL (ref 0.82–1.77)
TSH: 1.66 u[IU]/mL (ref 0.450–4.500)

## 2022-05-24 LAB — VITAMIN D 25 HYDROXY (VIT D DEFICIENCY, FRACTURES): Vit D, 25-Hydroxy: 46.2 ng/mL (ref 30.0–100.0)

## 2022-05-24 MED ORDER — METFORMIN HCL 500 MG PO TABS: 500.0000 mg | ORAL_TABLET | Freq: Two times a day (BID) | ORAL | 3 refills | Status: DC

## 2022-05-24 NOTE — Progress Notes (Signed)
Please inform the patient to start taking otc fish or 2000 mg twice daily for his triglycerides.  I have also increased the frequency of his metformin.  A prescription for metformin 500 mg twice daily is sent to his pharmacy to start taking.  His hemoglobin A1c is 6.5.

## 2022-08-23 ENCOUNTER — Ambulatory Visit: Payer: Medicare Other | Admitting: Family Medicine

## 2022-08-23 ENCOUNTER — Encounter: Payer: Self-pay | Admitting: Family Medicine

## 2022-09-18 ENCOUNTER — Encounter: Payer: Self-pay | Admitting: Family Medicine

## 2022-09-18 ENCOUNTER — Encounter: Payer: Self-pay | Admitting: Internal Medicine

## 2022-09-18 ENCOUNTER — Ambulatory Visit (HOSPITAL_COMMUNITY)
Admission: RE | Admit: 2022-09-18 | Discharge: 2022-09-18 | Disposition: A | Discharge status: Home / Self Care | Payer: Medicare Other | Source: Ambulatory Visit | Attending: Internal Medicine | Admitting: Internal Medicine

## 2022-09-18 ENCOUNTER — Ambulatory Visit (INDEPENDENT_AMBULATORY_CARE_PROVIDER_SITE_OTHER): Payer: Medicare Other | Admitting: Internal Medicine

## 2022-09-18 VITALS — BP 132/76 | HR 66 | Ht 67.0 in | Wt 133.0 lb

## 2022-09-18 DIAGNOSIS — G8921 Chronic pain due to trauma: Secondary | ICD-10-CM | POA: Insufficient documentation

## 2022-09-18 DIAGNOSIS — I700 Atherosclerosis of aorta: Secondary | ICD-10-CM | POA: Diagnosis not present

## 2022-09-18 DIAGNOSIS — M4316 Spondylolisthesis, lumbar region: Secondary | ICD-10-CM | POA: Diagnosis not present

## 2022-09-18 DIAGNOSIS — M542 Cervicalgia: Secondary | ICD-10-CM | POA: Diagnosis not present

## 2022-09-18 DIAGNOSIS — M545 Low back pain, unspecified: Secondary | ICD-10-CM | POA: Diagnosis not present

## 2022-09-18 DIAGNOSIS — M25512 Pain in left shoulder: Secondary | ICD-10-CM | POA: Diagnosis not present

## 2022-09-18 MED ORDER — DULOXETINE HCL 30 MG PO CPEP: 30.0000 mg | ORAL_CAPSULE | Freq: Every day | ORAL | 0 refills | Status: DC
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
## 2022-09-18 NOTE — Patient Instructions (Signed)
Thank you for trusting me with your care. To recap, today we discussed the following:    - DG Cervical Spine Complete - DG Lumbar Spine 2-3 Views - DG Shoulder Left - DULoxetine (CYMBALTA) 30 MG capsule; Take 1 capsule (30 mg total) by mouth daily.  Dispense: 90 capsule; Refill: 0   Start Cymbalta for pain. Go to [facility] to have xrays performed and I will follow up with results.

## 2022-09-21 ENCOUNTER — Encounter: Payer: Self-pay | Admitting: Internal Medicine

## 2022-10-01 ENCOUNTER — Emergency Department (HOSPITAL_COMMUNITY): Payer: Medicare Other

## 2022-10-01 ENCOUNTER — Encounter (HOSPITAL_COMMUNITY): Payer: Self-pay

## 2022-10-01 ENCOUNTER — Emergency Department (HOSPITAL_COMMUNITY)
Admission: EM | Admit: 2022-10-01 | Discharge: 2022-10-01 | Disposition: A | Discharge status: Home / Self Care | Payer: Medicare Other | Attending: Emergency Medicine | Admitting: Emergency Medicine

## 2022-10-01 ENCOUNTER — Other Ambulatory Visit: Payer: Self-pay

## 2022-10-01 DIAGNOSIS — Z1152 Encounter for screening for COVID-19: Secondary | ICD-10-CM | POA: Insufficient documentation

## 2022-10-01 DIAGNOSIS — M25512 Pain in left shoulder: Secondary | ICD-10-CM | POA: Diagnosis not present

## 2022-10-01 DIAGNOSIS — R0602 Shortness of breath: Secondary | ICD-10-CM | POA: Diagnosis not present

## 2022-10-01 DIAGNOSIS — Z7951 Long term (current) use of inhaled steroids: Secondary | ICD-10-CM | POA: Diagnosis not present

## 2022-10-01 DIAGNOSIS — J449 Chronic obstructive pulmonary disease, unspecified: Secondary | ICD-10-CM | POA: Diagnosis not present

## 2022-10-01 DIAGNOSIS — R059 Cough, unspecified: Secondary | ICD-10-CM | POA: Diagnosis not present

## 2022-10-01 DIAGNOSIS — Z8616 Personal history of COVID-19: Secondary | ICD-10-CM | POA: Diagnosis not present

## 2022-10-01 LAB — CBC WITH DIFFERENTIAL/PLATELET
Abs Immature Granulocytes: 0 10*3/uL (ref 0.00–0.07)
Basophils Absolute: 0 10*3/uL (ref 0.0–0.1)
Basophils Relative: 1 %
Eosinophils Absolute: 0 10*3/uL (ref 0.0–0.5)
Eosinophils Relative: 1 %
HCT: 44.1 % (ref 39.0–52.0)
Hemoglobin: 14.1 g/dL (ref 13.0–17.0)
Immature Granulocytes: 0 %
Lymphocytes Relative: 54 %
Lymphs Abs: 2.1 10*3/uL (ref 0.7–4.0)
MCH: 29.6 pg (ref 26.0–34.0)
MCHC: 32 g/dL (ref 30.0–36.0)
MCV: 92.5 fL (ref 80.0–100.0)
Monocytes Absolute: 0.4 10*3/uL (ref 0.1–1.0)
Monocytes Relative: 10 %
Neutro Abs: 1.3 10*3/uL — ABNORMAL LOW (ref 1.7–7.7)
Neutrophils Relative %: 34 %
Platelets: 175 10*3/uL (ref 150–400)
RBC: 4.77 MIL/uL (ref 4.22–5.81)
RDW: 13.2 % (ref 11.5–15.5)
WBC: 3.8 10*3/uL — ABNORMAL LOW (ref 4.0–10.5)
nRBC: 0 % (ref 0.0–0.2)

## 2022-10-01 LAB — BASIC METABOLIC PANEL
Anion gap: 7 (ref 5–15)
BUN: 19 mg/dL (ref 8–23)
CO2: 26 mmol/L (ref 22–32)
Calcium: 8.8 mg/dL — ABNORMAL LOW (ref 8.9–10.3)
Chloride: 103 mmol/L (ref 98–111)
Creatinine, Ser: 0.91 mg/dL (ref 0.61–1.24)
GFR, Estimated: >60 mL/min (ref >60)
Glucose, Bld: 121 mg/dL — ABNORMAL HIGH (ref 70–99)
Potassium: 4 mmol/L (ref 3.5–5.1)
Sodium: 136 mmol/L (ref 135–145)

## 2022-10-01 LAB — RESP PANEL BY RT-PCR (RSV, FLU A&B, COVID)  RVPGX2
Influenza A by PCR: NEGATIVE
Influenza B by PCR: NEGATIVE
Resp Syncytial Virus by PCR: NEGATIVE
SARS Coronavirus 2 by RT PCR: NEGATIVE

## 2022-10-01 LAB — TROPONIN I (HIGH SENSITIVITY)
Troponin I (High Sensitivity): 5 ng/L (ref <18)
Troponin I (High Sensitivity): 9 ng/L (ref <18)

## 2022-10-01 LAB — D-DIMER, QUANTITATIVE: D-Dimer, Quant: 0.35 ug/mL-FEU (ref 0.00–0.50)

## 2022-10-01 MED ORDER — PREDNISONE 20 MG PO TABS: 20.0000 mg | ORAL_TABLET | Freq: Once | ORAL | Status: DC

## 2022-10-01 MED ORDER — ALBUTEROL SULFATE (2.5 MG/3ML) 0.083% IN NEBU: 2.5000 mg | INHALATION_SOLUTION | Freq: Four times a day (QID) | RESPIRATORY_TRACT | 12 refills | Status: DC | PRN

## 2022-10-01 MED ORDER — ALBUTEROL SULFATE HFA 108 (90 BASE) MCG/ACT IN AERS: 1.0000 | INHALATION_SPRAY | Freq: Four times a day (QID) | RESPIRATORY_TRACT | 0 refills | Status: DC | PRN

## 2022-10-01 MED ORDER — IPRATROPIUM-ALBUTEROL 0.5-2.5 (3) MG/3ML IN SOLN
3.0000 mL | Freq: Once | RESPIRATORY_TRACT | Status: AC
  Administered 2022-10-01: 3 mL via RESPIRATORY_TRACT
  Filled 2022-10-01: qty 3

## 2022-10-01 MED ORDER — PREDNISONE 50 MG PO TABS
60.0000 mg | ORAL_TABLET | Freq: Once | ORAL | Status: AC
  Administered 2022-10-01: 60 mg via ORAL
  Filled 2022-10-01: qty 1

## 2022-10-01 MED ORDER — PREDNISONE 10 MG PO TABS: 40.0000 mg | ORAL_TABLET | Freq: Every day | ORAL | 0 refills | Status: AC
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
## 2022-10-01 NOTE — ED Provider Notes (Signed)
 [place] EMERGENCY DEPARTMENT AT [facility] Provider Note   CSN: [id number] Arrival date & time: 10/01/22  1137     History Chief Complaint  Patient presents with   Shortness of Breath    Ronald Ward is a 77 y.o. male.  Patient with past medical history significant for shoulder pain and rotator cuff disorders of both shoulders and COPD presents emergency department with shortness of breath.  Patient also reporting some left-sided shoulder blade pain.   Shortness of Breath Associated symptoms: no chest pain, no cough and no fever        Home Medications Prior to Admission medications   Medication Sig Start Date End Date Taking? Authorizing Provider  albuterol (PROVENTIL) (2.5 MG/3ML) 0.083% nebulizer solution Take 3 mLs (2.5 mg total) by nebulization every 6 (six) hours as needed for wheezing or shortness of breath. 10/01/22  Yes Smitty Knudsen, PA-C  cholecalciferol (VITAMIN D) 25 MCG (1000 UNIT) tablet Take 5,000 Units by mouth daily.   Yes [provider]  DULoxetine (CYMBALTA) 30 MG capsule Take 1 capsule (30 mg total) by mouth daily. 09/18/22  Yes Gardenia Phlegm, MD  EPINEPHrine 0.3 mg/0.3 mL IJ SOAJ injection Inject 0.3 mLs (0.3 mg total) into the muscle once as needed (for allergic reaction). 04/08/16  Yes Samuel Jester, DO  predniSONE (DELTASONE) 10 MG tablet Take 4 tablets (40 mg total) by mouth daily for 5 days. 10/01/22 10/06/22 Yes Smitty Knudsen, PA-C  simvastatin (ZOCOR) 20 MG tablet Take 20 mg by mouth every evening.   Yes [provider]  tamsulosin (FLOMAX) 0.4 MG CAPS capsule Take 1 capsule (0.4 mg total) by mouth daily. 12/20/21  Yes [place], Regan Rakers, PA-C  albuterol (VENTOLIN HFA) 108 (90 Base) MCG/ACT inhaler Inhale 1-2 puffs into the lungs every 6 (six) hours as needed for wheezing or shortness of breath. 10/01/22   Smitty Knudsen, PA-C  enalapril (VASOTEC) 2.5 MG tablet Take 1 tablet by mouth daily. Patient not  taking: Reported on 10/01/2022 08/29/18   [provider]  metFORMIN (GLUCOPHAGE) 500 MG tablet Take 1 tablet (500 mg total) by mouth 2 (two) times daily with a meal. Patient not taking: Reported on 10/01/2022 05/24/22   Gilmore Laroche, FNP  triamcinolone cream (KENALOG) 0.1 % Apply 1 Application topically 2 (two) times daily. Patient not taking: Reported on 10/01/2022 04/23/22   Gilmore Laroche, FNP      Allergies    Bee venom, Fish allergy, and Shellfish allergy    Review of Systems   Review of Systems  Constitutional:  Negative for chills and fever.  Respiratory:  Positive for shortness of breath. Negative for cough and chest tightness.   Cardiovascular:  Negative for chest pain.  Neurological:  Negative for light-headedness.  All other systems reviewed and are negative.   Physical Exam Updated Vital Signs BP (!) 166/88   Pulse (!) 51   Temp 97.7 F (36.5 C) (Oral)   Resp 18   Ht 5\' 7"  (1.702 m)   Wt 60.3 kg   SpO2 97%   BMI 20.83 kg/m  Physical Exam Vitals and nursing note reviewed.  Constitutional:      Appearance: He is well-developed.  HENT:     Head: Normocephalic and atraumatic.  Cardiovascular:     Rate and Rhythm: Regular rhythm. Bradycardia present.  Pulmonary:     Effort: Pulmonary effort is normal. No tachypnea or accessory muscle usage.     Breath sounds: Normal breath  sounds.  Skin:    General: Skin is warm and dry.     Capillary Refill: Capillary refill takes less than 2 seconds.     Coloration: Skin is not cyanotic.  Neurological:     General: No focal deficit present.     Mental Status: He is alert.  Psychiatric:        Mood and Affect: Mood normal.     ED Results / Procedures / Treatments   Labs (all labs ordered are listed, but only abnormal results are displayed) Labs Reviewed  CBC WITH DIFFERENTIAL/PLATELET - Abnormal; Notable for the following components:      Result Value   WBC 3.8 (*)    Neutro Abs 1.3 (*)    All other  components within normal limits  BASIC METABOLIC PANEL - Abnormal; Notable for the following components:   Glucose, Bld 121 (*)    Calcium 8.8 (*)    All other components within normal limits  RESP PANEL BY RT-PCR (RSV, FLU A&B, COVID)  [id number]  D-DIMER, QUANTITATIVE  TROPONIN I (HIGH SENSITIVITY)  TROPONIN I (HIGH SENSITIVITY)    EKG EKG Interpretation  Date/Time:  Monday October 01 2022 12:11:37 EST Ventricular Rate:  70 PR Interval:  128 QRS Duration: 90 QT Interval:  378 QTC Calculation: 408 R Axis:   112 Text Interpretation: Sinus rhythm with marked sinus arrhythmia with frequent Premature ventricular complexes Right axis deviation Pulmonary disease pattern Septal infarct (cited on or before 01-Oct-2022) Abnormal ECG When compared with ECG of 31-Jul-2021 13:26, QRS axis Shifted right Questionable change in initial forces of Anteroseptal leads Confirmed by Meridee Score [phone]) on 10/01/2022 12:25:24 PM  Radiology DG Chest 2 View  Result Date: 10/01/2022 CLINICAL DATA:  Cough EXAM: CHEST - 2 VIEW COMPARISON:  01/20/2022 FINDINGS: Old right-sided rib fractures. Mild left basilar scar or atelectasis with slight elevation of the left hemidiaphragm, eventration. There are loops of air-filled bowel beneath the left hemidiaphragm. Chronic lung changes. No consolidation, pneumothorax, effusion or edema. Normal cardiopericardial silhouette. Calcified aorta IMPRESSION: Chronic lung changes with basilar scar or atelectasis. Electronically Signed   By: Karen Kays M.D.   On: 10/01/2022 12:46    Procedures Procedures   Medications Ordered in ED Medications  ipratropium-albuterol (DUONEB) 0.5-2.5 (3) MG/3ML nebulizer solution 3 mL (3 mLs Nebulization Given 10/01/22 1555)  predniSONE (DELTASONE) tablet 60 mg (60 mg Oral Given 10/01/22 1800)    ED Course/ Medical Decision Making/ A&P                           Medical Decision Making Amount and/or Complexity of Data Reviewed Labs:  ordered. Radiology: ordered.   This patient presents to the ED for concern of shortness of breath.  Differential diagnosis includes pulmonary embolism, COPD exacerbation, foreign body obstruction, viral URI, ACS   Lab Tests:  I Ordered, and personally interpreted labs.  The pertinent results include: Leukopenia 3.8, labs otherwise normal and reassuring with a negative troponin and negative D-dimer.  Patient's respiratory viral panel also negative.   Imaging Studies ordered:  I ordered imaging studies including chest x-ray I independently visualized and interpreted imaging which showed chronic pulmonary changes without acute abnormal findings I agree with the radiologist interpretation   Medicines ordered and prescription drug management:  I ordered medication including DuoNeb, prednisone for shortness of breath Reevaluation of the patient after these medicines showed that the patient improved I have reviewed the patients home medicines and have  made adjustments as needed   Problem List / ED Course:  Patient presents emergency department complaints of shortness of breath has been worsening for the last 2 days.  Patient reports a prior history significant for COPD managed with inhalers at home.  Reports that he uses albuterol inhalers for exacerbations.  He reports that his shortness of breath have been worsening but reports significant treatment with DuoNeb that was administered here in the emergency department.  Based on presentation and reassuring lab workup has been performed up to this point with also reassuring EKG and imaging, denies patient that this is likely a minor exacerbation of his COPD that he can safely manage at home without any need for admission at this time.  Administered 60 mg of prednisone prior to patient discharge and also sent prescriptions for 40 mg prednisone for the next 5 days as well as albuterol inhalers and albuterol nebulizer solution that patient can use at  home as needed.  Patient was agreeable to treatment plan verbalized understanding all return precautions.  All questions answered prior to patient discharge.   Social Determinants of Health:  Not currently seen by pulmonology  Final Clinical Impression(s) / ED Diagnoses Final diagnoses:  Shortness of breath  Chronic obstructive pulmonary disease, unspecified COPD type (HCC)    Rx / DC Orders ED Discharge Orders          Ordered    predniSONE (DELTASONE) 10 MG tablet  Daily        10/01/22 1808    albuterol (VENTOLIN HFA) 108 (90 Base) MCG/ACT inhaler  Every 6 hours PRN        10/01/22 1812    albuterol (PROVENTIL) (2.5 MG/3ML) 0.083% nebulizer solution  Every 6 hours PRN        10/01/22 1812              Smitty Knudsen, PA-C 10/01/22 1856    Kommor, Wyn Forster, MD 10/02/22 1307

## 2022-10-01 NOTE — ED Triage Notes (Signed)
Reports sob and left rib pain with coughing and movement.  Reports productive cough.  Complains of pain under left axilla.

## 2022-10-01 NOTE — ED Notes (Signed)
Pt alert, NAD, calm, interactive, speaking in clear complete sentences. C/o L posterior shoulder pain with movement, ongoing x1 week. Also endorses cough, sob.

## 2022-10-01 NOTE — Discharge Instructions (Addendum)
You were seen in the ED for shortness of breath. All of your labs were reassuring and no acute findings or abnormalities noted. This is likely related to COPD. I have given a dose of prednisone here in the ED and also sent in a prescription for 5 days of prednisone to your pharmacy.

## 2022-10-01 NOTE — ED Notes (Signed)
RT at [facility] to initiate duo neb tx. Pt alert, NAD, calm, interactive.

## 2022-10-16 DIAGNOSIS — E1142 Type 2 diabetes mellitus with diabetic polyneuropathy: Secondary | ICD-10-CM | POA: Diagnosis not present

## 2022-10-16 DIAGNOSIS — L84 Corns and callosities: Secondary | ICD-10-CM | POA: Diagnosis not present

## 2022-10-16 DIAGNOSIS — M79676 Pain in unspecified toe(s): Secondary | ICD-10-CM | POA: Diagnosis not present

## 2022-10-16 DIAGNOSIS — B351 Tinea unguium: Secondary | ICD-10-CM | POA: Diagnosis not present

## 2022-12-10 ENCOUNTER — Telehealth: Payer: Self-pay | Admitting: Family Medicine

## 2022-12-10 NOTE — Telephone Encounter (Signed)
Prescription Request  12/10/2022  LOV: 05/23/2022  What is the name of the medication or equipment? albuterol (VENTOLIN HFA) 108 (90 Base) MCG/ACT inhaler   Have you contacted your pharmacy to request a refill? No   Which pharmacy would you like this sent to?  [facility] - [place], [place] - [address]  [place] [address] Phone: [phone] Fax: [phone]    Patient notified that their request is being sent to the clinical staff for review and that they should receive a response within 2 business days.   Please advise at Mobile [phone] (mobile)

## 2022-12-11 ENCOUNTER — Encounter: Payer: Self-pay | Admitting: Family Medicine

## 2022-12-11 ENCOUNTER — Other Ambulatory Visit: Payer: Self-pay

## 2022-12-11 ENCOUNTER — Ambulatory Visit (INDEPENDENT_AMBULATORY_CARE_PROVIDER_SITE_OTHER): Payer: Medicare Other | Admitting: Family Medicine

## 2022-12-11 VITALS — BP 112/69 | HR 84 | Ht 67.0 in | Wt 129.0 lb

## 2022-12-11 DIAGNOSIS — M19019 Primary osteoarthritis, unspecified shoulder: Secondary | ICD-10-CM

## 2022-12-11 DIAGNOSIS — Z122 Encounter for screening for malignant neoplasm of respiratory organs: Secondary | ICD-10-CM | POA: Insufficient documentation

## 2022-12-11 DIAGNOSIS — G8929 Other chronic pain: Secondary | ICD-10-CM | POA: Diagnosis not present

## 2022-12-11 DIAGNOSIS — M47812 Spondylosis without myelopathy or radiculopathy, cervical region: Secondary | ICD-10-CM

## 2022-12-11 DIAGNOSIS — M25511 Pain in right shoulder: Secondary | ICD-10-CM

## 2022-12-11 MED ORDER — ALBUTEROL SULFATE HFA 108 (90 BASE) MCG/ACT IN AERS: 1.0000 | INHALATION_SPRAY | Freq: Four times a day (QID) | RESPIRATORY_TRACT | 0 refills | Status: DC | PRN

## 2022-12-11 MED ORDER — CYCLOBENZAPRINE HCL 5 MG PO TABS: 5.0000 mg | ORAL_TABLET | Freq: Every evening | ORAL | 1 refills | Status: DC | PRN

## 2022-12-11 MED ORDER — METHYLPREDNISOLONE 4 MG PO TBPK: ORAL_TABLET | ORAL | 0 refills | Status: DC

## 2022-12-11 MED ORDER — DULOXETINE HCL 60 MG PO CPEP: 60.0000 mg | ORAL_CAPSULE | Freq: Every day | ORAL | 3 refills | Status: DC

## 2022-12-11 NOTE — Assessment & Plan Note (Signed)
Referral placed to pulmonary for lung cancer screening Reports smoking history since 1964 1 pack of cigarettes daily

## 2022-12-11 NOTE — Patient Instructions (Addendum)
I appreciate the opportunity to provide care to you today!    Follow up: 1 months  Labs: next visit   Pleas pick up your prescriptions at the pharmacy  Referrals today- orthopedics and PT   Please continue to a heart-healthy diet and increase your physical activities. Try to exercise for at least five days a week.      It was a pleasure to see you and I look forward to continuing to work together on your health and well-being. Please do not hesitate to call the office if you need care or have questions about your care.   Have a wonderful day and week. With Gratitude, Gilmore Laroche MSN, FNP-BC
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
## 2022-12-11 NOTE — Assessment & Plan Note (Signed)
Pain reported with 90 degree flexion of the right shoulder Inability to perform internal rotation of the right shoulder due to increased pain and stiffness Pain reported with the Apley scratch test Will treat today with Medrol Dosepak Encouraged not to take NSAID products while on steroids

## 2022-12-11 NOTE — Telephone Encounter (Signed)
Refills sent to pharmacy. 

## 2022-12-11 NOTE — Assessment & Plan Note (Signed)
Chronic condition Complains of numbness and tingling in the right arm Was recently treated with Cymbalta 30 mg daily Will increase his Cymbalta to 60 mg daily Encouraged to start taking Flexeril 5 mg nightly as needed Referral placed to physical therapy and orthopedic surgery

## 2023-01-01 ENCOUNTER — Telehealth: Payer: Self-pay

## 2023-01-01 ENCOUNTER — Other Ambulatory Visit: Payer: Self-pay

## 2023-01-01 MED ORDER — TAMSULOSIN HCL 0.4 MG PO CAPS: 0.4000 mg | ORAL_CAPSULE | Freq: Every day | ORAL | 0 refills | Status: DC

## 2023-01-01 NOTE — Telephone Encounter (Signed)
90 day supply sent to pharmacy to get patient to next apt with MD in 03/2023

## 2023-01-01 NOTE — Telephone Encounter (Signed)
Patient left a voice message 12-31-22.  Needing refill of tamsulosin (FLOMAX) 0.4 MG CAPS capsule   Thank you.

## 2023-01-10 ENCOUNTER — Ambulatory Visit: Payer: Medicare Other | Admitting: Family Medicine

## 2023-02-19 ENCOUNTER — Other Ambulatory Visit: Payer: Self-pay | Admitting: Family Medicine

## 2023-02-19 ENCOUNTER — Telehealth: Payer: Self-pay | Admitting: Family Medicine

## 2023-02-19 DIAGNOSIS — Z122 Encounter for screening for malignant neoplasm of respiratory organs: Secondary | ICD-10-CM

## 2023-02-19 NOTE — Telephone Encounter (Signed)
Prescription Request  02/19/2023  LOV: 12/11/2022  What is the name of the medication or equipment? DULoxetine (CYMBALTA) 60 MG capsule   Have you contacted your pharmacy to request a refill? Yes   Which pharmacy would you like this sent to?  [facility] - [place], [place] - [address] [place] [address] Phone: [phone] Fax: [phone]    Patient notified that their request is being sent to the clinical staff for review and that they should receive a response within 2 business days.   Please advise at [facility] [phone]

## 2023-02-20 ENCOUNTER — Other Ambulatory Visit: Payer: Self-pay

## 2023-02-20 DIAGNOSIS — Z122 Encounter for screening for malignant neoplasm of respiratory organs: Secondary | ICD-10-CM

## 2023-02-20 MED ORDER — DULOXETINE HCL 60 MG PO CPEP: 60.0000 mg | ORAL_CAPSULE | Freq: Every day | ORAL | 3 refills | Status: DC

## 2023-02-20 NOTE — Telephone Encounter (Signed)
Refill sent.

## 2023-03-14 DIAGNOSIS — B351 Tinea unguium: Secondary | ICD-10-CM | POA: Diagnosis not present

## 2023-03-14 DIAGNOSIS — M79676 Pain in unspecified toe(s): Secondary | ICD-10-CM | POA: Diagnosis not present

## 2023-03-14 DIAGNOSIS — E1142 Type 2 diabetes mellitus with diabetic polyneuropathy: Secondary | ICD-10-CM | POA: Diagnosis not present

## 2023-03-14 DIAGNOSIS — L84 Corns and callosities: Secondary | ICD-10-CM | POA: Diagnosis not present

## 2023-03-25 NOTE — Progress Notes (Incomplete)
History of Present Illness:   He was originally referred in October, 2014 by Dr. Felecia Shelling for evaluation and management of an elevated PSA. He was seen in September 2012 @ [facility] Dept. of Urology for a PSA that went from 3.7 in June 2011 to 5.3 in June 2012.  11.30.2012: TRUS/Bx. PSA 5.3. Prostatic volume was 38.5. PSAD 0.14. All biopsies revealed benign tissue, with foci of chronic prostatitis. At that time he was incarcerated. He was followed after that, and apparently PSA was slightly elevated, but he did not undergo a repeat biopsy. Prior to his original referral, he had a PSA drawn by Dr. Felecia Shelling that was 8.  PCA 3 test at his initial visit in October 2014 was positive at 35. That revealed a 30% chance of having a positive biopsy if it was to be repeated.  2.3.2015. Repeat TRUS/Bx. Prostatic volume was approximately 70 mL. All cores were benign.  Repeat PSA levels:  8.12.2015-4.69 (14% free)  8.23.2016--8.96  11.8.2018--7.8  10.31.2019--7.9  8.11.2020--15.5    10.12.2020: MRI prostate--volume 132 ml. PIRADS 3 lesion left posterolateraal/posteromedial gland. No capsular penetration, SV/NV, bony involvement, no pelvic LA.    11.16.2020: Fusion TRUS/Bx. (1/16 cores HG/PIN, left base lateral). All 4 cores from ROI benign.             11.2.2021: PSA is 13.2             8.16.2022: PSA 14.8            8.15.2023:   Past Medical History:  Diagnosis Date   Arthritis    Asthma    COPD (chronic obstructive pulmonary disease) (HCC)    Diabetes mellitus without complication (HCC)    Hypertension     Past Surgical History:  Procedure Laterality Date   ANKLE SURGERY     Arm surgery     COLONOSCOPY N/A 11/22/2016   Procedure: COLONOSCOPY;  Surgeon: Malissa Hippo, MD;  [facility];  Service: Endoscopy;  Laterality: N/A;  930   PENILE ADHESIONS LYSIS      Home Medications:  Allergies as of 03/26/2023       Reactions   Bee Venom Itching, Swelling, Other (See Comments)   Cough    Fish Allergy Itching, Swelling, Other (See Comments)   Cough   Shellfish Allergy Itching, Swelling, Other (See Comments)   Cough        Medication List        Accurate as of March 25, 2023 12:50 PM. If you have any questions, ask your nurse or doctor.          albuterol (2.5 MG/3ML) 0.083% nebulizer solution Commonly known as: PROVENTIL Take 3 mLs (2.5 mg total) by nebulization every 6 (six) hours as needed for wheezing or shortness of breath.   albuterol 108 (90 Base) MCG/ACT inhaler Commonly known as: VENTOLIN HFA Inhale 1-2 puffs into the lungs every 6 (six) hours as needed for wheezing or shortness of breath.   cholecalciferol 25 MCG (1000 UNIT) tablet Commonly known as: VITAMIN D3 Take 5,000 Units by mouth daily.   cyclobenzaprine 5 MG tablet Commonly known as: FLEXERIL TAKE (1) TABLET BY MOUTH AT BEDTIME AS NEEDED.   DULoxetine 60 MG capsule Commonly known as: Cymbalta Take 1 capsule (60 mg total) by mouth daily.   enalapril 2.5 MG tablet Commonly known as: VASOTEC Take 1 tablet by mouth daily.   EPINEPHrine 0.3 mg/0.3 mL Soaj injection Commonly known as: EPI-PEN Inject 0.3 mLs (0.3 mg total)  into the muscle once as needed (for allergic reaction).   metFORMIN 500 MG tablet Commonly known as: GLUCOPHAGE Take 1 tablet (500 mg total) by mouth 2 (two) times daily with a meal.   methylPREDNISolone 4 MG Tbpk tablet Commonly known as: MEDROL DOSEPAK Take as the package instructed   simvastatin 20 MG tablet Commonly known as: ZOCOR Take 20 mg by mouth every evening.   tamsulosin 0.4 MG Caps capsule Commonly known as: FLOMAX Take 1 capsule (0.4 mg total) by mouth daily.   triamcinolone cream 0.1 % Commonly known as: KENALOG Apply 1 Application topically 2 (two) times daily.        Allergies:  Allergies  Allergen Reactions   Bee Venom Itching, Swelling and Other (See Comments)    Cough   Fish Allergy Itching, Swelling and Other (See Comments)     Cough   Shellfish Allergy Itching, Swelling and Other (See Comments)    Cough    Family History  Problem Relation Age of Onset   Heart attack Mother    Diabetes Mother    Diabetes Brother    Cancer Brother     Social History:  reports that he has been smoking cigarettes. He started smoking about 62 years ago. He has a 43.5 pack-year smoking history. He has never used smokeless tobacco. He reports current alcohol use. He reports that he does not use drugs.  ROS: A complete review of systems was performed.  All systems are negative except for pertinent findings as noted.  Physical Exam:  Vital signs in last 24 hours: There were no vitals taken for this visit. Constitutional:  Alert and oriented, No acute distress Cardiovascular: Regular rate  Respiratory: Normal respiratory effort GI: Abdomen is soft, nontender, nondistended, no abdominal masses. No CVAT.  Genitourinary: Normal male phallus, testes are descended bilaterally and non-tender and without masses, scrotum is normal in appearance without lesions or masses, perineum is normal on inspection. Lymphatic: No lymphadenopathy Neurologic: Grossly intact, no focal deficits Psychiatric: Normal mood and affect  I have reviewed prior pt notes  I have reviewed notes from referring/previous physicians  I have reviewed urinalysis results  I have independently reviewed prior imaging  I have reviewed prior PSA results  I have reviewed prior urine culture   Impression/Assessment:  ***  Plan:  ***

## 2023-03-26 ENCOUNTER — Ambulatory Visit: Payer: Medicare Other | Admitting: Urology

## 2023-03-26 DIAGNOSIS — N401 Enlarged prostate with lower urinary tract symptoms: Secondary | ICD-10-CM

## 2023-03-26 DIAGNOSIS — N138 Other obstructive and reflux uropathy: Secondary | ICD-10-CM

## 2023-03-26 DIAGNOSIS — R310 Gross hematuria: Secondary | ICD-10-CM

## 2023-03-26 DIAGNOSIS — R339 Retention of urine, unspecified: Secondary | ICD-10-CM

## 2023-03-26 DIAGNOSIS — R972 Elevated prostate specific antigen [PSA]: Secondary | ICD-10-CM

## 2023-04-16 ENCOUNTER — Telehealth: Payer: Self-pay | Admitting: Family Medicine

## 2023-04-16 NOTE — Telephone Encounter (Signed)
Patient spouse called asking for patient to be seen today in office. Sent teams message asking nurse:  TEAMS MESSAGE  Important! Ronald Ward patient :  I have Patient spouse calling Sheilah Pigeon she is in the hospital for covid and her spouse Jahsai Pennachio MRN [id number] Ronald Ward patient)needs appt with provider today due to covid [place]  [phone] nurse speak to patient to work him in. we have no openings, he is 77 years old and very concerned  he only can do in office no phone apps / video. he can not afford UC or ER per patient spouse.

## 2023-04-17 NOTE — Progress Notes (Unsigned)
    Patient Office Visit   Subjective   Patient ID: Ronald Ward, male    DOB: 05-11-1946  Age: 77 y.o. MRN: [id number]  CC: No chief complaint on file.   HPI Ronald Ward 77 year old male, presents to the clinic for  He  has a past medical history of Arthritis, Asthma, COPD (chronic obstructive pulmonary disease) (HCC), Diabetes mellitus without complication (HCC), and Hypertension.  HPI    Outpatient Encounter Medications as of 04/18/2023  Medication Sig   albuterol (PROVENTIL) (2.5 MG/3ML) 0.083% nebulizer solution Take 3 mLs (2.5 mg total) by nebulization every 6 (six) hours as needed for wheezing or shortness of breath.   albuterol (VENTOLIN HFA) 108 (90 Base) MCG/ACT inhaler Inhale 1-2 puffs into the lungs every 6 (six) hours as needed for wheezing or shortness of breath.   cholecalciferol (VITAMIN D) 25 MCG (1000 UNIT) tablet Take 5,000 Units by mouth daily.   cyclobenzaprine (FLEXERIL) 5 MG tablet TAKE (1) TABLET BY MOUTH AT BEDTIME AS NEEDED.   DULoxetine (CYMBALTA) 60 MG capsule Take 1 capsule (60 mg total) by mouth daily.   enalapril (VASOTEC) 2.5 MG tablet Take 1 tablet by mouth daily.   EPINEPHrine 0.3 mg/0.3 mL IJ SOAJ injection Inject 0.3 mLs (0.3 mg total) into the muscle once as needed (for allergic reaction).   metFORMIN (GLUCOPHAGE) 500 MG tablet Take 1 tablet (500 mg total) by mouth 2 (two) times daily with a meal.   methylPREDNISolone (MEDROL DOSEPAK) 4 MG TBPK tablet Take as the package instructed   simvastatin (ZOCOR) 20 MG tablet Take 20 mg by mouth every evening.   tamsulosin (FLOMAX) 0.4 MG CAPS capsule Take 1 capsule (0.4 mg total) by mouth daily.   triamcinolone cream (KENALOG) 0.1 % Apply 1 Application topically 2 (two) times daily.   No facility-administered encounter medications on file as of 04/18/2023.    Past Surgical History:  Procedure Laterality Date   ANKLE SURGERY     Arm surgery     COLONOSCOPY N/A 11/22/2016   Procedure: COLONOSCOPY;   Surgeon: Malissa Hippo, MD;  [facility];  Service: Endoscopy;  Laterality: N/A;  930   PENILE ADHESIONS LYSIS      ROS    Objective    There were no vitals taken for this visit.  Physical Exam    Assessment & Plan:  There are no diagnoses linked to this encounter.  No follow-ups on file.   Cruzita Lederer Newman Nip, FNP

## 2023-04-17 NOTE — Patient Instructions (Signed)

## 2023-04-18 ENCOUNTER — Ambulatory Visit: Payer: Medicare Other | Admitting: Family Medicine

## 2023-04-24 ENCOUNTER — Ambulatory Visit
Admission: EM | Admit: 2023-04-24 | Discharge: 2023-04-24 | Disposition: A | Discharge status: Home / Self Care | Payer: Medicare Other | Attending: Family Medicine | Admitting: Family Medicine

## 2023-04-24 DIAGNOSIS — Z20822 Contact with and (suspected) exposure to covid-19: Secondary | ICD-10-CM | POA: Diagnosis not present

## 2023-04-24 DIAGNOSIS — J441 Chronic obstructive pulmonary disease with (acute) exacerbation: Secondary | ICD-10-CM | POA: Insufficient documentation

## 2023-04-24 DIAGNOSIS — J069 Acute upper respiratory infection, unspecified: Secondary | ICD-10-CM | POA: Insufficient documentation

## 2023-04-24 MED ORDER — PROMETHAZINE-DM 6.25-15 MG/5ML PO SYRP: 5.0000 mL | ORAL_SOLUTION | Freq: Four times a day (QID) | ORAL | 0 refills | Status: DC | PRN

## 2023-04-24 MED ORDER — PREDNISONE 20 MG PO TABS: 40.0000 mg | ORAL_TABLET | Freq: Every day | ORAL | 0 refills | Status: DC

## 2023-04-24 MED ORDER — MOLNUPIRAVIR EUA 200MG CAPSULE: 4.0000 | ORAL_CAPSULE | Freq: Two times a day (BID) | ORAL | 0 refills | Status: AC

## 2023-04-24 NOTE — ED Triage Notes (Addendum)
Pt c/o cough, body aches x 1 week, wife tested positive last week. Pt has a hx of COPD

## 2023-04-24 NOTE — ED Provider Notes (Signed)
[facility]    CSN: [id number] Arrival date & time: 04/24/23  0849      History   Chief Complaint No chief complaint on file.   HPI Ronald Ward is a 77 y.o. male.   Patient presenting today with several day history of cough, body aches, nasal congestion, fatigue, chest tightness, wheezing.  Denies chest pain, significant shortness of breath, abdominal pain, nausea vomiting or diarrhea.  Patient's wife has been in the hospital with COVID this week.  History of COPD on inhaler regimen, states has been wheezing and tightness chest.    Past Medical History:  Diagnosis Date   Arthritis    Asthma    COPD (chronic obstructive pulmonary disease) (HCC)    Diabetes mellitus without complication (HCC)    Hypertension     Patient Active Problem List   Diagnosis Date Noted   Encounter for screening for lung cancer 12/11/2022   Herpes zoster 04/23/2022   Need for immunization against influenza 04/23/2022   Chronic pain 02/20/2022   Essential hypertension 02/20/2022   Family hx of colon cancer 08/23/2016   Disorder of rotator cuff of both shoulders 03/11/2014   Cervical spondylosis without myelopathy 03/11/2014   Arthritis, shoulder region 03/11/2014   Pain in shoulder 03/11/2014    Past Surgical History:  Procedure Laterality Date   ANKLE SURGERY     Arm surgery     COLONOSCOPY N/A 11/22/2016   Procedure: COLONOSCOPY;  Surgeon: Malissa Hippo, MD;  [facility];  Service: Endoscopy;  Laterality: N/A;  930   PENILE ADHESIONS LYSIS         Home Medications    Prior to Admission medications   Medication Sig Start Date End Date Taking? Authorizing Provider  molnupiravir EUA (LAGEVRIO) 200 mg CAPS capsule Take 4 capsules (800 mg total) by mouth 2 (two) times daily for 5 days. 04/24/23 04/29/23 Yes Particia Nearing, PA-C  predniSONE (DELTASONE) 20 MG tablet Take 2 tablets (40 mg total) by mouth daily with breakfast. 04/24/23  Yes Particia Nearing, PA-C  promethazine-dextromethorphan (PROMETHAZINE-DM) 6.25-15 MG/5ML syrup Take 5 mLs by mouth 4 (four) times daily as needed. 04/24/23  Yes Particia Nearing, PA-C  albuterol (PROVENTIL) (2.5 MG/3ML) 0.083% nebulizer solution Take 3 mLs (2.5 mg total) by nebulization every 6 (six) hours as needed for wheezing or shortness of breath. 10/01/22   Smitty Knudsen, PA-C  albuterol (VENTOLIN HFA) 108 (90 Base) MCG/ACT inhaler Inhale 1-2 puffs into the lungs every 6 (six) hours as needed for wheezing or shortness of breath. 12/11/22   Gilmore Laroche, FNP  cholecalciferol (VITAMIN D) 25 MCG (1000 UNIT) tablet Take 5,000 Units by mouth daily.    [provider]  cyclobenzaprine (FLEXERIL) 5 MG tablet TAKE (1) TABLET BY MOUTH AT BEDTIME AS NEEDED. 02/19/23   Gilmore Laroche, FNP  DULoxetine (CYMBALTA) 60 MG capsule Take 1 capsule (60 mg total) by mouth daily. 02/20/23   Gilmore Laroche, FNP  enalapril (VASOTEC) 2.5 MG tablet Take 1 tablet by mouth daily. 08/29/18   [provider]  EPINEPHrine 0.3 mg/0.3 mL IJ SOAJ injection Inject 0.3 mLs (0.3 mg total) into the muscle once as needed (for allergic reaction). 04/08/16   Samuel Jester, DO  metFORMIN (GLUCOPHAGE) 500 MG tablet Take 1 tablet (500 mg total) by mouth 2 (two) times daily with a meal. 05/24/22   Gilmore Laroche, FNP  methylPREDNISolone (MEDROL DOSEPAK) 4 MG TBPK tablet Take as the package instructed 12/11/22  Gilmore Laroche, FNP  simvastatin (ZOCOR) 20 MG tablet Take 20 mg by mouth every evening.    [provider]  tamsulosin (FLOMAX) 0.4 MG CAPS capsule Take 1 capsule (0.4 mg total) by mouth daily. 01/01/23   Marcine Matar, MD  triamcinolone cream (KENALOG) 0.1 % Apply 1 Application topically 2 (two) times daily. 04/23/22   Gilmore Laroche, FNP    Family History Family History  Problem Relation Age of Onset   Heart attack Mother    Diabetes Mother    Diabetes Brother    Cancer Brother     Social  History Social History   Tobacco Use   Smoking status: Every Day    Current packs/day: 0.00    Average packs/day: 0.8 packs/day for 58.0 years (43.5 ttl pk-yrs)    Types: Cigarettes    Start date: 08/31/1960    Last attempt to quit: 08/31/2018    Years since quitting: 4.6   Smokeless tobacco: Never  Vaping Use   Vaping status: Never Used  Substance Use Topics   Alcohol use: Yes    Comment: occasionally    Drug use: No     Allergies   Bee venom, Fish allergy, and Shellfish allergy   Review of Systems Review of Systems PER HPI  Physical Exam Triage Vital Signs ED Triage Vitals  Encounter Vitals Group     BP 04/24/23 1018 (!) 147/87     Systolic BP Percentile --      Diastolic BP Percentile --      Pulse Rate 04/24/23 1018 (!) 56     Resp 04/24/23 1018 18     Temp 04/24/23 1018 97.6 F (36.4 C)     Temp Source 04/24/23 1018 Oral     SpO2 04/24/23 1018 96 %     Weight --      Height --      Head Circumference --      Peak Flow --      Pain Score 04/24/23 1020 8     Pain Loc --      Pain Education --      Exclude from Growth Chart --    No data found.  Updated Vital Signs BP (!) 147/87 (BP Location: Right Arm)   Pulse (!) 56   Temp 97.6 F (36.4 C) (Oral)   Resp 18   SpO2 96%   Visual Acuity Right Eye Distance:   Left Eye Distance:   Bilateral Distance:    Right Eye Near:   Left Eye Near:    Bilateral Near:     Physical Exam Vitals and nursing note reviewed.  Constitutional:      Appearance: He is well-developed.  HENT:     Head: Atraumatic.     Right Ear: External ear normal.     Left Ear: External ear normal.     Nose: Rhinorrhea present.     Mouth/Throat:     Pharynx: Posterior oropharyngeal erythema present. No oropharyngeal exudate.  Eyes:     Conjunctiva/sclera: Conjunctivae normal.     Pupils: Pupils are equal, round, and reactive to light.  Cardiovascular:     Rate and Rhythm: Normal rate and regular rhythm.  Pulmonary:      Effort: Pulmonary effort is normal. No respiratory distress.     Breath sounds: Wheezing present. No rales.  Musculoskeletal:        General: Normal range of motion.     Cervical back: Normal range of motion and neck supple.  Lymphadenopathy:     Cervical: No cervical adenopathy.  Skin:    General: Skin is warm and dry.  Neurological:     Mental Status: He is alert and oriented to person, place, and time.  Psychiatric:        Behavior: Behavior normal.    UC Treatments / Results  Labs (all labs ordered are listed, but only abnormal results are displayed) Labs Reviewed  SARS CORONAVIRUS 2 (TAT 6-24 HRS)    EKG   Radiology No results found.  Procedures Procedures (including critical care time)  Medications Ordered in UC Medications - No data to display  Initial Impression / Assessment and Plan / UC Course  I have reviewed the triage vital signs and the nursing notes.  Pertinent labs & imaging results that were available during my care of the patient were reviewed by me and considered in my medical decision making (see chart for details).     Highly suspicious for COVID given symptoms and home exposure, COVID test pending, will start molnupiravir in the meantime and treat COPD exacerbation with prednisone, Phenergan DM, continued albuterol and inhaler regimen.  Return for worsening symptoms.  Final Clinical Impressions(s) / UC Diagnoses   Final diagnoses:  Viral URI with cough  Exposure to COVID-19 virus  COPD exacerbation [facility])   Discharge Instructions   None    ED Prescriptions     Medication Sig Dispense Auth. Provider   predniSONE (DELTASONE) 20 MG tablet Take 2 tablets (40 mg total) by mouth daily with breakfast. 10 tablet Particia Nearing, PA-C   promethazine-dextromethorphan (PROMETHAZINE-DM) 6.25-15 MG/5ML syrup Take 5 mLs by mouth 4 (four) times daily as needed. 100 mL Particia Nearing, PA-C   molnupiravir EUA (LAGEVRIO) 200 mg CAPS capsule  Take 4 capsules (800 mg total) by mouth 2 (two) times daily for 5 days. 40 capsule Particia Nearing, [place]      PDMP not reviewed this encounter.   Particia Nearing, [place] 04/24/23 1902

## 2023-04-25 LAB — SARS CORONAVIRUS 2 (TAT 6-24 HRS): SARS Coronavirus 2: NEGATIVE

## 2023-05-27 ENCOUNTER — Other Ambulatory Visit: Payer: Self-pay | Admitting: Urology

## 2023-05-28 ENCOUNTER — Encounter (HOSPITAL_COMMUNITY): Payer: Self-pay | Admitting: Emergency Medicine

## 2023-05-28 ENCOUNTER — Emergency Department (HOSPITAL_COMMUNITY)
Admission: EM | Admit: 2023-05-28 | Discharge: 2023-05-28 | Disposition: A | Discharge status: Home / Self Care | Payer: Medicare Other | Attending: Emergency Medicine | Admitting: Emergency Medicine

## 2023-05-28 ENCOUNTER — Other Ambulatory Visit: Payer: Self-pay

## 2023-05-28 DIAGNOSIS — E119 Type 2 diabetes mellitus without complications: Secondary | ICD-10-CM | POA: Insufficient documentation

## 2023-05-28 DIAGNOSIS — R339 Retention of urine, unspecified: Secondary | ICD-10-CM | POA: Diagnosis not present

## 2023-05-28 DIAGNOSIS — J449 Chronic obstructive pulmonary disease, unspecified: Secondary | ICD-10-CM | POA: Diagnosis not present

## 2023-05-28 DIAGNOSIS — I10 Essential (primary) hypertension: Secondary | ICD-10-CM | POA: Insufficient documentation

## 2023-05-28 DIAGNOSIS — J45909 Unspecified asthma, uncomplicated: Secondary | ICD-10-CM | POA: Diagnosis not present

## 2023-05-28 HISTORY — DX: Disorder of prostate, unspecified: N42.9

## 2023-05-28 LAB — URINALYSIS, ROUTINE W REFLEX MICROSCOPIC
Bilirubin Urine: NEGATIVE
Glucose, UA: >=500 mg/dL — AB
Ketones, ur: NEGATIVE mg/dL
Leukocytes,Ua: NEGATIVE
Nitrite: NEGATIVE
Protein, ur: 30 mg/dL — AB
Specific Gravity, Urine: 1.018 (ref 1.005–1.030)
pH: 5 (ref 5.0–8.0)

## 2023-05-28 LAB — CBC
HCT: 37.1 % — ABNORMAL LOW (ref 39.0–52.0)
Hemoglobin: 12.1 g/dL — ABNORMAL LOW (ref 13.0–17.0)
MCH: 30.3 pg (ref 26.0–34.0)
MCHC: 32.6 g/dL (ref 30.0–36.0)
MCV: 92.8 fL (ref 80.0–100.0)
Platelets: 181 10*3/uL (ref 150–400)
RBC: 4 MIL/uL — ABNORMAL LOW (ref 4.22–5.81)
RDW: 13.7 % (ref 11.5–15.5)
WBC: 4.8 10*3/uL (ref 4.0–10.5)
nRBC: 0 % (ref 0.0–0.2)

## 2023-05-28 LAB — COMPREHENSIVE METABOLIC PANEL
ALT: 11 U/L (ref 0–44)
AST: 17 U/L (ref 15–41)
Albumin: 3.3 g/dL — ABNORMAL LOW (ref 3.5–5.0)
Alkaline Phosphatase: 82 U/L (ref 38–126)
Anion gap: 9 (ref 5–15)
BUN: 23 mg/dL (ref 8–23)
CO2: 26 mmol/L (ref 22–32)
Calcium: 9.1 mg/dL (ref 8.9–10.3)
Chloride: 103 mmol/L (ref 98–111)
Creatinine, Ser: 0.88 mg/dL (ref 0.61–1.24)
GFR, Estimated: >60 mL/min (ref >60)
Glucose, Bld: 192 mg/dL — ABNORMAL HIGH (ref 70–99)
Potassium: 3.3 mmol/L — ABNORMAL LOW (ref 3.5–5.1)
Sodium: 138 mmol/L (ref 135–145)
Total Bilirubin: 0.3 mg/dL (ref 0.3–1.2)
Total Protein: 6.6 g/dL (ref 6.5–8.1)

## 2023-05-28 NOTE — Discharge Instructions (Signed)
You were evaluated in the Emergency Department and after careful evaluation, we did not find any emergent condition requiring admission or further testing in the hospital.  Your exam/testing today is overall reassuring.  Recommend follow-up with your urologist for continued care.  Please return to the Emergency Department if you experience any worsening of your condition.   Thank you for allowing [place] to be a part of your care.

## 2023-05-28 NOTE — ED Provider Notes (Signed)
[facility] Emergency Department Provider Note MRN:  [id number]  Arrival date & time: 05/28/23     Chief Complaint   Urinary Retention and Rectal Bleeding   History of Present Illness   Ronald Ward is a 77 y.o. year-old male with a history of hypertension, diabetes, COPD presenting to the ED with chief complaint of urinary retention.  Great difficulty urinating over the past 2 days.  Lower abdominal pressure.  Denies fever.  Review of Systems  A thorough review of systems was obtained and all systems are negative except as noted in the HPI and PMH.   Patient's Health History    Past Medical History:  Diagnosis Date   Arthritis    Asthma    COPD (chronic obstructive pulmonary disease) (HCC)    Diabetes mellitus without complication (HCC)    Hypertension    Prostate disease     Past Surgical History:  Procedure Laterality Date   ANKLE SURGERY     Arm surgery     COLONOSCOPY N/A 11/22/2016   Procedure: COLONOSCOPY;  Surgeon: Malissa Hippo, MD;  [facility];  Service: Endoscopy;  Laterality: N/A;  930   PENILE ADHESIONS LYSIS      Family History  Problem Relation Age of Onset   Heart attack Mother    Diabetes Mother    Diabetes Brother    Cancer Brother     Social History   Socioeconomic History   Marital status: Divorced    Spouse name: Not on file   Number of children: Not on file   Years of education: Not on file   Highest education level: Not on file  Occupational History   Not on file  Tobacco Use   Smoking status: Every Day    Current packs/day: 0.00    Average packs/day: 0.8 packs/day for 58.0 years (43.5 ttl pk-yrs)    Types: Cigarettes    Start date: 08/31/1960    Last attempt to quit: 08/31/2018    Years since quitting: 4.7   Smokeless tobacco: Never  Vaping Use   Vaping status: Never Used  Substance and Sexual Activity   Alcohol use: Yes    Comment: occasionally    Drug use: No   Sexual activity: Not on  file  Other Topics Concern   Not on file  Social History Narrative   Not on file   Social Determinants of Health   Financial Resource Strain: Low Risk  (04/09/2022)   Overall Financial Resource Strain (CARDIA)    Difficulty of Paying Living Expenses: Not hard at all  Food Insecurity: No Food Insecurity (04/09/2022)   Hunger Vital Sign    Worried About Running Out of Food in the Last Year: Never true    Ran Out of Food in the Last Year: Never true  Transportation Needs: Unmet Transportation Needs (04/09/2022)   PRAPARE - Transportation    Lack of Transportation (Medical): Yes    Lack of Transportation (Non-Medical): Yes  Physical Activity: Inactive (04/09/2022)   Exercise Vital Sign    Days of Exercise per Week: 0 days    Minutes of Exercise per Session: 0 min  Stress: No Stress Concern Present (04/09/2022)   [employer] of Occupational Health - Occupational Stress Questionnaire    Feeling of Stress : Not at all  Social Connections: Moderately Isolated (04/09/2022)   Social Connection and Isolation Panel [NHANES]    Frequency of Communication with Friends and Family: Twice a week  Frequency of Social Gatherings with Friends and Family: Twice a week    Attends Religious Services: Never    Database administrator or Organizations: No    Attends Banker Meetings: Never    Marital Status: Married  Catering manager Violence: Not At Risk (04/09/2022)   Humiliation, Afraid, Rape, and Kick questionnaire    Fear of Current or Ex-Partner: No    Emotionally Abused: No    Physically Abused: No    Sexually Abused: No     Physical Exam   Vitals:   05/28/23 0525 05/28/23 0526  BP: 121/72   Pulse:  74  Resp: 16   Temp: 98.6 F (37 C)   SpO2:  95%    CONSTITUTIONAL: Chronically ill-appearing, NAD NEURO/PSYCH:  Alert and oriented x 3, no focal deficits EYES:  eyes equal and reactive ENT/NECK:  no LAD, no JVD CARDIO: Regular rate, well-perfused, normal S1 and  S2 PULM:  CTAB no wheezing or rhonchi GI/GU:  non-distended, non-tender MSK/SPINE:  No gross deformities, no edema SKIN:  no rash, atraumatic   *Additional and/or pertinent findings included in MDM below  Diagnostic and Interventional Summary    EKG Interpretation Date/Time:    Ventricular Rate:    PR Interval:    QRS Duration:    QT Interval:    QTC Calculation:   R Axis:      Text Interpretation:         Labs Reviewed  CBC - Abnormal; Notable for the following components:      Result Value   RBC 4.00 (*)    Hemoglobin 12.1 (*)    HCT 37.1 (*)    All other components within normal limits  COMPREHENSIVE METABOLIC PANEL - Abnormal; Notable for the following components:   Potassium 3.3 (*)    Glucose, Bld 192 (*)    Albumin 3.3 (*)    All other components within normal limits  URINALYSIS, ROUTINE W REFLEX MICROSCOPIC - Abnormal; Notable for the following components:   Glucose, UA >=500 (*)    Hgb urine dipstick SMALL (*)    Protein, ur 30 (*)    Bacteria, UA MANY (*)    All other components within normal limits    No orders to display    Medications - No data to display   Procedures  /  Critical Care Procedures  ED Course and Medical Decision Making  Initial Impression and Ddx Urinary retention, history of prostate issues, has required catheters in the past.  Will place Foley, check renal function, evaluate for possible UTI.  Past medical/surgical history that increases complexity of ED encounter: Enlarged prostate  Interpretation of Diagnostics I personally reviewed the laboratory assessment and my interpretation is as follows: No significant blood count or electrolyte disturbance, urinalysis without significant evidence of infection    Patient Reassessment and Ultimate Disposition/Management     Feeling better after catheter placement, appropriate for discharge.  Patient management required discussion with the following services or consulting groups:   None  Complexity of Problems Addressed Acute illness or injury that poses threat of life of bodily function  Additional Data Reviewed and Analyzed Further history obtained from: Further history from spouse/family member  Additional Factors Impacting ED Encounter Risk None  Elmer Sow. Pilar Plate, MD [facility] Emergency Medicine [facility] [email or web address]  Final Clinical Impressions(s) / ED Diagnoses     ICD-10-CM   1. Urinary retention  R33.9       ED Discharge Orders  None        Discharge Instructions Discussed with and Provided to Patient:     Discharge Instructions      You were evaluated in the Emergency Department and after careful evaluation, we did not find any emergent condition requiring admission or further testing in the hospital.  Your exam/testing today is overall reassuring.  Recommend follow-up with your urologist for continued care.  Please return to the Emergency Department if you experience any worsening of your condition.   Thank you for allowing [place] to be a part of your care.       Sabas Sous, MD 05/28/23 [phone]

## 2023-05-28 NOTE — ED Triage Notes (Addendum)
Pt reports he has not urinated in 2-3 days with dark stools, pt hx of prostate disease/enlarged prostate

## 2023-05-28 NOTE — ED Notes (Signed)
Pt given leg bag at discharge; family member states she knows how to change out the bag; pt assisted to wheelchair and assisted into car

## 2023-05-28 NOTE — Telephone Encounter (Signed)
Patient was seen in ED today and had cath place , unable to urinate, has blockage.   Wife said he needs refill on tamsulosin (FLOMAX) 0.4 MG CAPS capsule  he is completely out

## 2023-05-31 ENCOUNTER — Other Ambulatory Visit: Payer: Self-pay | Admitting: Family Medicine

## 2023-05-31 DIAGNOSIS — Z122 Encounter for screening for malignant neoplasm of respiratory organs: Secondary | ICD-10-CM

## 2023-06-03 ENCOUNTER — Encounter: Payer: Self-pay | Admitting: Urology

## 2023-06-03 ENCOUNTER — Ambulatory Visit: Payer: Medicare Other | Admitting: Urology

## 2023-06-03 VITALS — BP 124/69 | HR 66

## 2023-06-03 DIAGNOSIS — R972 Elevated prostate specific antigen [PSA]: Secondary | ICD-10-CM

## 2023-06-03 DIAGNOSIS — R339 Retention of urine, unspecified: Secondary | ICD-10-CM

## 2023-06-03 DIAGNOSIS — N401 Enlarged prostate with lower urinary tract symptoms: Secondary | ICD-10-CM | POA: Diagnosis not present

## 2023-06-03 NOTE — Progress Notes (Signed)
History of Present Illness: Here for follow-up of several issues.  BPH    He has been on tamsulosin.  In May 2023 he went into acute urinary retention.  This happened after he ran out of his tamsulosin.  He subsequently passed a voiding trial here on 17 May, 2023.       ED   He states that he can get occasional erection.  He does not want any help with this      Elevated PSA:      He was originally referred in October, 2014 by Dr. Felecia Shelling for evaluation and management of an elevated PSA. He was seen in September 2012 @ [facility] for a PSA that went from 3.7 in June 2011 to 5.3 in June 2012.  11.30.2012: TRUS/Bx. PSA 5.3. Prostatic volume was 38.5. PSAD 0.14. All biopsies revealed benign tissue, with foci of chronic prostatitis. At that time he was incarcerated. He was followed after that, and apparently PSA was slightly elevated, but he did not undergo a repeat biopsy. Prior to his original referral, he had a PSA drawn by Dr. Felecia Shelling that was 8.  PCA 3 test at his initial visit in October 2014 was positive at 35. That revealed a 30% chance of having a positive biopsy if it was to be repeated.  2.3.2015. Repeat TRUS/Bx. Prostatic volume was approximately 70 mL. All cores were benign.  Repeat PSA levels:  8.12.2015-4.69 (14% free)  8.23.2016--8.96  11.8.2018--7.8  10.31.2019--7.9  8.11.2020--15.5    10.12.2020: MRI prostate--volume 132 ml. PIRADS 3 lesion left posterolateraal/posteromedial gland. No capsular penetration, SV/NV, bony involvement, no pelvic LA.    11.16.2020: Fusion TRUS/Bx. Prostate volume 106 ml. (1/16 cores HG/PIN, left base lateral). All 4 cores from ROI benign.             11.2.2021: PSA is 13.2              8.16.2022: PSA 14.8            8.15.2023: PSA 15.8  10.21.2024: Here w/ catheter--presented a week ago in AUR--despite being on 2 tamsulosin/day. In ER SCr 0.9.   Past Medical History:  Diagnosis Date   Arthritis    Asthma    COPD (chronic  obstructive pulmonary disease) (HCC)    Diabetes mellitus without complication (HCC)    Hypertension    Prostate disease     Past Surgical History:  Procedure Laterality Date   ANKLE SURGERY     Arm surgery     COLONOSCOPY N/A 11/22/2016   Procedure: COLONOSCOPY;  Surgeon: Malissa Hippo, MD;  [facility];  Service: Endoscopy;  Laterality: N/A;  930   PENILE ADHESIONS LYSIS      Home Medications:  Allergies as of 06/03/2023       Reactions   Bee Venom Itching, Swelling, Other (See Comments)   Cough   Fish Allergy Itching, Swelling, Other (See Comments)   Cough   Shellfish Allergy Itching, Swelling, Other (See Comments)   Cough        Medication List        Accurate as of June 03, 2023 12:58 PM. If you have any questions, ask your nurse or doctor.          albuterol (2.5 MG/3ML) 0.083% nebulizer solution Commonly known as: PROVENTIL Take 3 mLs (2.5 mg total) by nebulization every 6 (six) hours as needed for wheezing or shortness of breath.   albuterol 108 (90 Base) MCG/ACT inhaler  Commonly known as: VENTOLIN HFA Inhale 1-2 puffs into the lungs every 6 (six) hours as needed for wheezing or shortness of breath.   cholecalciferol 25 MCG (1000 UNIT) tablet Commonly known as: VITAMIN D3 Take 5,000 Units by mouth daily.   cyclobenzaprine 5 MG tablet Commonly known as: FLEXERIL TAKE (1) TABLET BY MOUTH AT BEDTIME AS NEEDED.   DULoxetine 60 MG capsule Commonly known as: Cymbalta Take 1 capsule (60 mg total) by mouth daily.   enalapril 2.5 MG tablet Commonly known as: VASOTEC Take 1 tablet by mouth daily.   EPINEPHrine 0.3 mg/0.3 mL Soaj injection Commonly known as: EPI-PEN Inject 0.3 mLs (0.3 mg total) into the muscle once as needed (for allergic reaction).   metFORMIN 500 MG tablet Commonly known as: GLUCOPHAGE Take 1 tablet (500 mg total) by mouth 2 (two) times daily with a meal.   methylPREDNISolone 4 MG Tbpk tablet Commonly known as:  MEDROL DOSEPAK Take as the package instructed   predniSONE 20 MG tablet Commonly known as: DELTASONE Take 2 tablets (40 mg total) by mouth daily with breakfast.   promethazine-dextromethorphan 6.25-15 MG/5ML syrup Commonly known as: PROMETHAZINE-DM Take 5 mLs by mouth 4 (four) times daily as needed.   simvastatin 20 MG tablet Commonly known as: ZOCOR Take 20 mg by mouth every evening.   tamsulosin 0.4 MG Caps capsule Commonly known as: FLOMAX Take 1 capsule (0.4 mg total) by mouth daily.   triamcinolone cream 0.1 % Commonly known as: KENALOG Apply 1 Application topically 2 (two) times daily.        Allergies:  Allergies  Allergen Reactions   Bee Venom Itching, Swelling and Other (See Comments)    Cough   Fish Allergy Itching, Swelling and Other (See Comments)    Cough   Shellfish Allergy Itching, Swelling and Other (See Comments)    Cough    Family History  Problem Relation Age of Onset   Heart attack Mother    Diabetes Mother    Diabetes Brother    Cancer Brother     Social History:  reports that he has been smoking cigarettes. He started smoking about 62 years ago. He has a 43.5 pack-year smoking history. He has never used smokeless tobacco. He reports current alcohol use. He reports that he does not use drugs.  ROS: A complete review of systems was performed.  All systems are negative except for pertinent findings as noted.  Physical Exam:  Vital signs in last 24 hours: There were no vitals taken for this visit. Constitutional:  Alert and oriented, No acute distress Cardiovascular: Regular rate  Respiratory: Normal respiratory effort GI: Abdomen is soft, nontender, nondistended, no abdominal masses. No CVAT.  Genitourinary: Normal anal sphincter tone.  Prostate 100 mL, symmetric, nonnodular, nontender.   Psychiatric: Normal mood and affect  I have reviewed prior pt note  I have independently reviewed prior imaging --MRI, prior prostate ultrasound  volume  I have reviewed prior PSA results  Prior pathology results reviewed  ER notes reviewed      Impression/Assessment:  1.  Elevated PSA with several negative biopsies.  He does have a very large gland. PSA a year ago fairly stable  2.  BPH w/ recurrent AUR--on max med Rx    Plan:  1.  I will check his PSA  2.  I think he needs definitive surgical mgmt. W/ large gland size I think he would best be served by a HoLEP. Will refer him to Dr Richardo Hanks for this  3. OK to stop tamsulosin

## 2023-06-12 ENCOUNTER — Other Ambulatory Visit: Payer: Self-pay

## 2023-06-12 ENCOUNTER — Encounter (HOSPITAL_COMMUNITY): Payer: Self-pay | Admitting: Emergency Medicine

## 2023-06-12 ENCOUNTER — Emergency Department (HOSPITAL_COMMUNITY)
Admission: EM | Admit: 2023-06-12 | Discharge: 2023-06-12 | Disposition: A | Discharge status: Home / Self Care | Payer: Medicare Other | Attending: Emergency Medicine | Admitting: Emergency Medicine

## 2023-06-12 DIAGNOSIS — N3001 Acute cystitis with hematuria: Secondary | ICD-10-CM | POA: Insufficient documentation

## 2023-06-12 DIAGNOSIS — R319 Hematuria, unspecified: Secondary | ICD-10-CM | POA: Diagnosis present

## 2023-06-12 LAB — URINALYSIS, ROUTINE W REFLEX MICROSCOPIC
Bilirubin Urine: NEGATIVE
Glucose, UA: NEGATIVE mg/dL
Ketones, ur: NEGATIVE mg/dL
Nitrite: POSITIVE — AB
Protein, ur: 100 mg/dL — AB
RBC / HPF: >50 RBC/hpf (ref 0–5)
Specific Gravity, Urine: 1.013 (ref 1.005–1.030)
WBC, UA: >50 WBC/hpf (ref 0–5)
pH: 8 (ref 5.0–8.0)

## 2023-06-12 MED ORDER — CEPHALEXIN 500 MG PO CAPS: 500.0000 mg | ORAL_CAPSULE | Freq: Four times a day (QID) | ORAL | 0 refills | Status: AC

## 2023-06-12 NOTE — ED Triage Notes (Signed)
Pt reports indwelling foley catheter placed 2 weeks ago. Reports hematuria and burning. Unable to get in with urologist until 11/8.

## 2023-06-12 NOTE — ED Provider Notes (Signed)
[place] EMERGENCY DEPARTMENT AT [facility] Provider Note   CSN: [id number] Arrival date & time: 06/12/23  1062     History  Chief Complaint  Patient presents with   Hematuria    Ronald Ward is a 77 y.o. male.  Pt noticed blood in his urine today.  Pt feels like he has a uti.  Pt has an enlarged prostate and has a foley due to retention.    The history is provided by the patient and the spouse. No language interpreter was used.  Hematuria This is a new problem. The current episode started 6 to 12 hours ago. The problem occurs constantly. The problem has not changed since onset.Pertinent negatives include no chest pain and no abdominal pain. Nothing aggravates the symptoms. Nothing relieves the symptoms. He has tried nothing for the symptoms. The treatment provided no relief.       Home Medications Prior to Admission medications   Medication Sig Start Date End Date Taking? Authorizing Provider  albuterol (PROVENTIL) (2.5 MG/3ML) 0.083% nebulizer solution Take 3 mLs (2.5 mg total) by nebulization every 6 (six) hours as needed for wheezing or shortness of breath. 10/01/22   Smitty Knudsen, PA-C  albuterol (VENTOLIN HFA) 108 (90 Base) MCG/ACT inhaler Inhale 1-2 puffs into the lungs every 6 (six) hours as needed for wheezing or shortness of breath. 12/11/22   Gilmore Laroche, FNP  cholecalciferol (VITAMIN D) 25 MCG (1000 UNIT) tablet Take 5,000 Units by mouth daily.    [provider]  cyclobenzaprine (FLEXERIL) 5 MG tablet TAKE (1) TABLET BY MOUTH AT BEDTIME AS NEEDED. 06/01/23   Gilmore Laroche, FNP  DULoxetine (CYMBALTA) 60 MG capsule Take 1 capsule (60 mg total) by mouth daily. 02/20/23   Gilmore Laroche, FNP  enalapril (VASOTEC) 2.5 MG tablet Take 1 tablet by mouth daily. 08/29/18   [provider]  EPINEPHrine 0.3 mg/0.3 mL IJ SOAJ injection Inject 0.3 mLs (0.3 mg total) into the muscle once as needed (for allergic reaction). 04/08/16   Samuel Jester, DO  metFORMIN (GLUCOPHAGE) 500 MG tablet Take 1 tablet (500 mg total) by mouth 2 (two) times daily with a meal. 05/24/22   Gilmore Laroche, FNP  methylPREDNISolone (MEDROL DOSEPAK) 4 MG TBPK tablet Take as the package instructed 12/11/22   Gilmore Laroche, FNP  predniSONE (DELTASONE) 20 MG tablet Take 2 tablets (40 mg total) by mouth daily with breakfast. 04/24/23   Particia Nearing, PA-C  promethazine-dextromethorphan (PROMETHAZINE-DM) 6.25-15 MG/5ML syrup Take 5 mLs by mouth 4 (four) times daily as needed. 04/24/23   Particia Nearing, PA-C  simvastatin (ZOCOR) 20 MG tablet Take 20 mg by mouth every evening.    [provider]  tamsulosin (FLOMAX) 0.4 MG CAPS capsule Take 1 capsule (0.4 mg total) by mouth daily. 01/01/23   Marcine Matar, MD  triamcinolone cream (KENALOG) 0.1 % Apply 1 Application topically 2 (two) times daily. 04/23/22   Gilmore Laroche, FNP      Allergies    Bee venom, Fish allergy, and Shellfish allergy    Review of Systems   Review of Systems  Cardiovascular:  Negative for chest pain.  Gastrointestinal:  Negative for abdominal pain.  Genitourinary:  Positive for hematuria.  All other systems reviewed and are negative.   Physical Exam Updated Vital Signs BP (!) 142/87   Pulse 64   Temp 98.8 F (37.1 C)   Resp 18   Ht 5\' 7"  (1.702 m)   Wt 61.2 kg  SpO2 97%   BMI 21.14 kg/m  Physical Exam Vitals and nursing note reviewed.  Constitutional:      Appearance: He is well-developed.  HENT:     Head: Normocephalic.  Cardiovascular:     Rate and Rhythm: Normal rate.  Pulmonary:     Effort: Pulmonary effort is normal.  Abdominal:     General: There is no distension.  Musculoskeletal:        General: Normal range of motion.     Cervical back: Normal range of motion.  Skin:    General: Skin is warm.  Neurological:     General: No focal deficit present.     Mental Status: He is alert and oriented to person, place, and time.      ED Results / Procedures / Treatments   Labs (all labs ordered are listed, but only abnormal results are displayed) Labs Reviewed  URINALYSIS, ROUTINE W REFLEX MICROSCOPIC - Abnormal; Notable for the following components:      Result Value   APPearance CLOUDY (*)    Hgb urine dipstick LARGE (*)    Protein, ur 100 (*)    Nitrite POSITIVE (*)    Leukocytes,Ua LARGE (*)    Bacteria, UA FEW (*)    All other components within normal limits    EKG None  Radiology No results found.  Procedures Procedures    Medications Ordered in ED Medications - No data to display  ED Course/ Medical Decision Making/ A&P                                 Medical Decision Making Pt complains of discomfort with urination. Pt has a foley.    Amount and/or Complexity of Data Reviewed Labs: ordered. Decision-making details documented in ED Course.    Details: Ua positive nitrates and positive leukocytes.    Risk Risk Details: Pt counseled on results.  Pt given rx for keflex            Final Clinical Impression(s) / ED Diagnoses Final diagnoses:  Acute cystitis with hematuria    Rx / DC Orders ED Discharge Orders     None     An After Visit Summary was printed and given to the patient.     Elson Areas, [place] 06/12/23 1408    Bethann Berkshire, MD 06/12/23 [phone]

## 2023-06-16 LAB — URINE CULTURE: Culture: >=100000 — AB

## 2023-06-17 ENCOUNTER — Telehealth (HOSPITAL_BASED_OUTPATIENT_CLINIC_OR_DEPARTMENT_OTHER): Payer: Self-pay | Admitting: *Deleted

## 2023-06-17 NOTE — Progress Notes (Signed)
ED Antimicrobial Stewardship Positive Culture Follow Up   Ronald Ward is an 77 y.o. male who presented to [facility] on 06/12/2023 with a chief complaint of  Chief Complaint  Patient presents with   Hematuria    Recent Results (from the past 720 hour(s))  Urine Culture     Status: Abnormal   Collection Time: 06/12/23  1:29 PM   Specimen: Urine, Clean Catch  Result Value Ref Range Status   Specimen Description   Final    URINE, CLEAN CATCH Performed at [facility], [address], [place], [place] [address]    Special Requests   Final    NONE Performed at [facility], [address], [place], [place] [address]    Culture (A)  Final    >=100,000 COLONIES/mL ENTEROCOCCUS FAECALIS >=100,000 COLONIES/mL ENTEROBACTER SAKAZAKII    Report Status 06/16/2023 FINAL  Final   Organism ID, Bacteria ENTEROCOCCUS FAECALIS (A)  Final   Organism ID, Bacteria ENTEROBACTER SAKAZAKII (A)  Final      Susceptibility   Enterococcus faecalis - MIC*    AMPICILLIN <=2 SENSITIVE Sensitive     NITROFURANTOIN <=16 SENSITIVE Sensitive     VANCOMYCIN 1 SENSITIVE Sensitive     * >=100,000 COLONIES/mL ENTEROCOCCUS FAECALIS   Enterobacter sakazakii - MIC*    CEFEPIME <=0.12 SENSITIVE Sensitive     CEFTRIAXONE <=0.25 SENSITIVE Sensitive     CIPROFLOXACIN <=0.25 SENSITIVE Sensitive     GENTAMICIN <=1 SENSITIVE Sensitive     IMIPENEM <=0.25 SENSITIVE Sensitive     NITROFURANTOIN 32 SENSITIVE Sensitive     TRIMETH/SULFA <=20 SENSITIVE Sensitive     PIP/TAZO <=4 SENSITIVE Sensitive ug/mL    * >=100,000 COLONIES/mL ENTEROBACTER SAKAZAKII    [x]  Treated with Keflex, organism resistant to prescribed antimicrobial   New antibiotic prescription: Macrobid 100mg    ED Provider: Dr. Berle Mull, PharmD, BCCCP  06/17/2023, 10:14 AM Clinical Pharmacist Monday - Friday phone -  [phone] Saturday - Sunday phone - [phone]

## 2023-06-17 NOTE — Telephone Encounter (Signed)
Post ED Visit - Positive Culture Follow-up: Unsuccessful Patient Follow-up  Culture assessed and recommendations reviewed by:  [x]  Estill Batten, Pharm.D. []  Celedonio Miyamoto, Pharm.D., BCPS AQ-ID []  Garvin Fila, Pharm.D., BCPS []  Georgina Pillion, [address].D., BCPS []  [place], [address].D., BCPS, AAHIVP []  Estella Husk, Pharm.D., BCPS, AAHIVP []  Sherlynn Carbon, PharmD []  Pollyann Samples, PharmD, BCPS  Positive urine culture  []  Patient discharged without antimicrobial prescription and treatment is now indicated [x]  Organism is resistant to prescribed ED discharge antimicrobial []  Patient with positive blood cultures  Plan: Stop Keflex Start Macrobid 100mg  BID x 7 days per Ernie Avena, MD  Unable to contact patient , letter will be sent to address on file  Ronald Ward 06/17/2023, 10:38 AM

## 2023-06-25 ENCOUNTER — Ambulatory Visit: Payer: Medicare Other | Admitting: Urology

## 2023-07-02 ENCOUNTER — Ambulatory Visit: Payer: Medicare Other | Admitting: Urology

## 2023-07-14 ENCOUNTER — Emergency Department (HOSPITAL_COMMUNITY)
Admission: EM | Admit: 2023-07-14 | Discharge: 2023-07-14 | Disposition: A | Discharge status: Home / Self Care | Payer: Medicare Other | Attending: Emergency Medicine | Admitting: Emergency Medicine

## 2023-07-14 ENCOUNTER — Encounter (HOSPITAL_COMMUNITY): Payer: Self-pay

## 2023-07-14 ENCOUNTER — Other Ambulatory Visit: Payer: Self-pay

## 2023-07-14 DIAGNOSIS — T83098A Other mechanical complication of other indwelling urethral catheter, initial encounter: Secondary | ICD-10-CM | POA: Insufficient documentation

## 2023-07-14 DIAGNOSIS — R1030 Lower abdominal pain, unspecified: Secondary | ICD-10-CM | POA: Insufficient documentation

## 2023-07-14 DIAGNOSIS — N390 Urinary tract infection, site not specified: Secondary | ICD-10-CM

## 2023-07-14 DIAGNOSIS — R339 Retention of urine, unspecified: Secondary | ICD-10-CM | POA: Insufficient documentation

## 2023-07-14 DIAGNOSIS — T83511A Infection and inflammatory reaction due to indwelling urethral catheter, initial encounter: Secondary | ICD-10-CM | POA: Diagnosis not present

## 2023-07-14 LAB — URINALYSIS, ROUTINE W REFLEX MICROSCOPIC
Bilirubin Urine: NEGATIVE
Glucose, UA: 50 mg/dL — AB
Ketones, ur: NEGATIVE mg/dL
Nitrite: POSITIVE — AB
Protein, ur: >=300 mg/dL — AB
RBC / HPF: >50 RBC/hpf (ref 0–5)
Specific Gravity, Urine: 1.014 (ref 1.005–1.030)
WBC, UA: >50 WBC/hpf (ref 0–5)
pH: 8 (ref 5.0–8.0)

## 2023-07-14 LAB — CBC
HCT: 43.2 % (ref 39.0–52.0)
Hemoglobin: 13.4 g/dL (ref 13.0–17.0)
MCH: 29.3 pg (ref 26.0–34.0)
MCHC: 31 g/dL (ref 30.0–36.0)
MCV: 94.3 fL (ref 80.0–100.0)
Platelets: 218 10*3/uL (ref 150–400)
RBC: 4.58 MIL/uL (ref 4.22–5.81)
RDW: 13.4 % (ref 11.5–15.5)
WBC: 6.7 10*3/uL (ref 4.0–10.5)
nRBC: 0 % (ref 0.0–0.2)

## 2023-07-14 LAB — BASIC METABOLIC PANEL
Anion gap: 11 (ref 5–15)
BUN: 31 mg/dL — ABNORMAL HIGH (ref 8–23)
CO2: 26 mmol/L (ref 22–32)
Calcium: 9.6 mg/dL (ref 8.9–10.3)
Chloride: 101 mmol/L (ref 98–111)
Creatinine, Ser: 1.13 mg/dL (ref 0.61–1.24)
GFR, Estimated: >60 mL/min (ref >60)
Glucose, Bld: 175 mg/dL — ABNORMAL HIGH (ref 70–99)
Potassium: 4.3 mmol/L (ref 3.5–5.1)
Sodium: 138 mmol/L (ref 135–145)

## 2023-07-14 MED ORDER — HYDROMORPHONE HCL 1 MG/ML IJ SOLN
0.5000 mg | Freq: Once | INTRAMUSCULAR | Status: AC
  Administered 2023-07-14: 0.5 mg via INTRAVENOUS
  Filled 2023-07-14: qty 0.5

## 2023-07-14 MED ORDER — SODIUM CHLORIDE 0.9 % IV SOLN
1.0000 g | Freq: Once | INTRAVENOUS | Status: AC
  Administered 2023-07-14: 1 g via INTRAVENOUS
  Filled 2023-07-14: qty 10

## 2023-07-14 MED ORDER — FOSFOMYCIN TROMETHAMINE 3 G PO PACK
3.0000 g | PACK | Freq: Once | ORAL | Status: AC
  Administered 2023-07-14: 3 g via ORAL
  Filled 2023-07-14: qty 3

## 2023-07-14 NOTE — ED Provider Notes (Signed)
[place] EMERGENCY DEPARTMENT AT [facility] Provider Note   CSN: [id number] Arrival date & time: 07/14/23  1207     History  Chief Complaint  Patient presents with   bladder spasms    Ronald Ward is a 77 y.o. male.  77 year old male with history of BPH who presents to the emergency department with urinary retention.  On 05/28/2023 he started having urinary retention.  Came to the emergency department a Foley catheter was placed.  Return to the emergency department on 06/12/2023 due to blood in his urine and was found to have a UTI.  Started on Keflex and sent home.  In the past week had intermittent blood coming from his urine and bladder spasms and lower abdominal pain.  Says that urine is now leaking out around the outside of his catheter and he has had decreased flow from it.  Still noticing blood clots.  Not on blood thinners.  Has had a biopsy twice that have not shown evidence of prostate cancer.  Denies any fevers or chills or flank pain.       Home Medications Prior to Admission medications   Medication Sig Start Date End Date Taking? Authorizing Provider  albuterol (PROVENTIL) (2.5 MG/3ML) 0.083% nebulizer solution Take 3 mLs (2.5 mg total) by nebulization every 6 (six) hours as needed for wheezing or shortness of breath. 10/01/22   Smitty Knudsen, PA-C  albuterol (VENTOLIN HFA) 108 (90 Base) MCG/ACT inhaler Inhale 1-2 puffs into the lungs every 6 (six) hours as needed for wheezing or shortness of breath. 12/11/22   Gilmore Laroche, FNP  cholecalciferol (VITAMIN D) 25 MCG (1000 UNIT) tablet Take 5,000 Units by mouth daily.    [provider]  cyclobenzaprine (FLEXERIL) 5 MG tablet TAKE (1) TABLET BY MOUTH AT BEDTIME AS NEEDED. 06/01/23   Gilmore Laroche, FNP  DULoxetine (CYMBALTA) 60 MG capsule Take 1 capsule (60 mg total) by mouth daily. 02/20/23   Gilmore Laroche, FNP  enalapril (VASOTEC) 2.5 MG tablet Take 1 tablet by mouth daily. 08/29/18   [provider]  EPINEPHrine 0.3 mg/0.3 mL IJ SOAJ injection Inject 0.3 mLs (0.3 mg total) into the muscle once as needed (for allergic reaction). 04/08/16   Samuel Jester, DO  metFORMIN (GLUCOPHAGE) 500 MG tablet Take 1 tablet (500 mg total) by mouth 2 (two) times daily with a meal. 05/24/22   Gilmore Laroche, FNP  methylPREDNISolone (MEDROL DOSEPAK) 4 MG TBPK tablet Take as the package instructed 12/11/22   Gilmore Laroche, FNP  predniSONE (DELTASONE) 20 MG tablet Take 2 tablets (40 mg total) by mouth daily with breakfast. 04/24/23   Particia Nearing, PA-C  promethazine-dextromethorphan (PROMETHAZINE-DM) 6.25-15 MG/5ML syrup Take 5 mLs by mouth 4 (four) times daily as needed. 04/24/23   Particia Nearing, PA-C  simvastatin (ZOCOR) 20 MG tablet Take 20 mg by mouth every evening.    [provider]  tamsulosin (FLOMAX) 0.4 MG CAPS capsule TAKE ONE CAPSULE BY MOUTH ONCE DAILY. 06/14/23   Marcine Matar, MD  triamcinolone cream (KENALOG) 0.1 % Apply 1 Application topically 2 (two) times daily. 04/23/22   Gilmore Laroche, FNP      Allergies    Bee venom, Fish allergy, and Shellfish allergy    Review of Systems   Review of Systems  Physical Exam Updated Vital Signs BP 119/86   Pulse 75   Temp 98.3 F (36.8 C) (Oral)   Resp 18   Ht 5\' 7"  (1.702 m)  Wt 67.1 kg   SpO2 90%   BMI 23.18 kg/m  Physical Exam Constitutional:      Comments: Very uncomfortable.  Abdominal:     General: There is distension.     Palpations: There is mass (Suprapubic distention and mass.  Foley catheter in place with some urine leaking around the outside of the catheter at the insertion site of the penis.  No urine in the Foley bag.).     Tenderness: There is no abdominal tenderness. There is no guarding.  Neurological:     Mental Status: He is alert.     ED Results / Procedures / Treatments   Labs (all labs ordered are listed, but only abnormal results are displayed) Labs Reviewed   BASIC METABOLIC PANEL - Abnormal; Notable for the following components:      Result Value   Glucose, Bld 175 (*)    BUN 31 (*)    All other components within normal limits  URINALYSIS, ROUTINE W REFLEX MICROSCOPIC - Abnormal; Notable for the following components:   Color, Urine AMBER (*)    APPearance TURBID (*)    Glucose, UA 50 (*)    Hgb urine dipstick MODERATE (*)    Protein, ur >=300 (*)    Nitrite POSITIVE (*)    Leukocytes,Ua LARGE (*)    Bacteria, UA MANY (*)    All other components within normal limits  URINE CULTURE  CBC    EKG None  Radiology No results found.  Procedures Procedures   EMERGENCY DEPARTMENT ULTRASOUND  Study: Limited Ultrasound of Bladder  INDICATIONS: to assess for urinary retention and/or bladder volume prior to urinary catheter Multiple views of the bladder were obtained in real-time in the transverse and longitudinal planes with a multi-frequency probe.  PERFORMED BY: Myself IMAGES ARCHIVED?: No LIMITATIONS: None INTERPRETATION: Large Volume and Foley catheter in place with debris in the bladder  Medications Ordered in ED Medications  HYDROmorphone (DILAUDID) injection 0.5 mg (0.5 mg Intravenous Given 07/14/23 1308)  cefTRIAXone (ROCEPHIN) 1 g in sodium chloride 0.9 % 100 mL IVPB (0 g Intravenous Stopped 07/14/23 1528)  fosfomycin (MONUROL) packet 3 g (3 g Oral Given 07/14/23 1620)    ED Course/ Medical Decision Making/ A&P                                 Medical Decision Making Amount and/or Complexity of Data Reviewed Labs: ordered.  Risk Prescription drug management.   Ronald Ward is a 77 y.o. male with comorbidities that complicate the patient evaluation including BPH who presents to the emergency department with urinary retention.    Initial Ddx:  Clogged Foley catheter, hematuria, AKI, UTI  MDM/Course:  Patient presents emergency department with a clogged Foley catheter.  Now having urine going out around the Foley  catheter.  Also has significant suprapubic pain.  Bladder ultrasound does show that he has urinary retention also with some sediment or clots.  Three-way Foley catheter was placed in case of need of bladder irrigation.  Did have some manual irrigation done that did not reveal significant amount of blood but did have some sediment that was found.  He had a urinalysis sent that appears to be consistent with UTI.  Initially was given a ceftriaxone dose but on further review it appears that he has had E faecalis before so also gave him a dose of fosfomycin which should cover him for UTI.  No signs of sepsis or pyelonephritis.  No AKI on his labs.  Will have him follow-up with urology to see when the catheter can be removed and to further evaluate what is going on with his urinary retention.   This patient presents to the ED for concern of complaints listed in HPI, this involves an extensive number of treatment options, and is a complaint that carries with it a high risk of complications and morbidity. Disposition including potential need for admission considered.   Dispo: DC Home. Return precautions discussed including, but not limited to, those listed in the AVS. Allowed pt time to ask questions which were answered fully prior to dc.  Additional history obtained from spouse Records reviewed Outpatient Clinic Notes The following labs were independently interpreted: Urinalysis and show urinary tract infection I have reviewed the patients home medications and made adjustments as needed Social Determinants of health:  Elderly  Portions of this note were generated with Scientist, clinical (histocompatibility and immunogenetics). Dictation errors may occur despite best attempts at proofreading.           Final Clinical Impression(s) / ED Diagnoses Final diagnoses:  Urinary retention  Urinary tract infection associated with indwelling urethral catheter, subsequent encounter    Rx / DC Orders ED Discharge Orders     None          Rondel Baton, MD 07/14/23 1730

## 2023-07-14 NOTE — Discharge Instructions (Signed)
You were seen for your urinary retention in the emergency department.  You had new Foley catheter placed.  You were given a dose of antibiotics in the emergency department to treat your urinary tract infection.  This is a one-time dose and he did not need to go home on additional antibiotics at this time.  You will be called about your urine culture in several days if you need different antibiotics.  Check your MyChart online for the results of any tests that had not resulted by the time you left the emergency department.   Follow-up with your primary doctor in 2-3 days regarding your visit.    Return immediately to the emergency department if you experience any of the following: Fevers, chills, flank pain, urinary retention, or any other concerning symptoms.    Thank you for visiting our Emergency Department. It was a pleasure taking care of you today.

## 2023-07-14 NOTE — ED Triage Notes (Signed)
Pt has had a foley in place for 2 weeks and has begun to have extremely painful bladder spasms with urine leaking around the catheter.

## 2023-07-15 ENCOUNTER — Telehealth: Payer: Self-pay

## 2023-07-15 NOTE — Transitions of Care (Post Inpatient/ED Visit) (Unsigned)
   07/15/2023  Name: Ronald Ward MRN: [id number] DOB: 1946-04-07  Today's TOC FU Call Status: Today's TOC FU Call Status:: Unsuccessful Call (1st Attempt) Unsuccessful Call (1st Attempt) Date: 07/15/23  Attempted to reach the patient regarding the most recent Inpatient/ED visit.  Follow Up Plan: Additional outreach attempts will be made to reach the patient to complete the Transitions of Care (Post Inpatient/ED visit) call.   Signature Karena Addison, LPN [facility] Nurse Health Advisor Direct Dial [phone]

## 2023-07-16 DIAGNOSIS — M79676 Pain in unspecified toe(s): Secondary | ICD-10-CM | POA: Diagnosis not present

## 2023-07-16 DIAGNOSIS — E1142 Type 2 diabetes mellitus with diabetic polyneuropathy: Secondary | ICD-10-CM | POA: Diagnosis not present

## 2023-07-16 DIAGNOSIS — L84 Corns and callosities: Secondary | ICD-10-CM | POA: Diagnosis not present

## 2023-07-16 DIAGNOSIS — B351 Tinea unguium: Secondary | ICD-10-CM | POA: Diagnosis not present

## 2023-07-16 NOTE — Transitions of Care (Post Inpatient/ED Visit) (Unsigned)
   07/16/2023  Name: Ronald Ward MRN: [id number] DOB: 08/30/1945  Today's TOC FU Call Status: Today's TOC FU Call Status:: Unsuccessful Call (2nd Attempt) Unsuccessful Call (1st Attempt) Date: 07/15/23 Unsuccessful Call (2nd Attempt) Date: 07/16/23  Attempted to reach the patient regarding the most recent Inpatient/ED visit.  Follow Up Plan: Additional outreach attempts will be made to reach the patient to complete the Transitions of Care (Post Inpatient/ED visit) call.   Signature Karena Addison, LPN [facility] Nurse Health Advisor Direct Dial [phone]

## 2023-07-17 LAB — URINE CULTURE: Culture: >=100000 — AB

## 2023-07-17 NOTE — Transitions of Care (Post Inpatient/ED Visit) (Signed)
   07/17/2023  Name: Ronald Ward MRN: [id number] DOB: [birth year]  Today's TOC FU Call Status: Today's TOC FU Call Status:: Unsuccessful Call (3rd Attempt) Unsuccessful Call (1st Attempt) Date: 07/15/23 Unsuccessful Call (2nd Attempt) Date: 07/16/23 Unsuccessful Call (3rd Attempt) Date: 07/17/23  Attempted to reach the patient regarding the most recent Inpatient/ED visit.  Follow Up Plan: No further outreach attempts will be made at this time. We have been unable to contact the patient.  Signature Karena Addison, LPN [facility] Nurse Health Advisor Direct Dial [phone]

## 2023-07-18 ENCOUNTER — Telehealth (HOSPITAL_BASED_OUTPATIENT_CLINIC_OR_DEPARTMENT_OTHER): Payer: Self-pay | Admitting: *Deleted

## 2023-07-18 NOTE — Telephone Encounter (Signed)
Post ED Visit - Positive Culture Follow-up  Culture report reviewed by antimicrobial stewardship pharmacist: [facility] [x]  Daylene Posey, Pharm.D. []  Celedonio Miyamoto, Pharm.D., BCPS AQ-ID []  Garvin Fila, Pharm.D., BCPS []  Georgina Pillion, Pharm.D., BCPS []  [place], [address].D., BCPS, AAHIVP []  Estella Husk, Pharm.D., BCPS, AAHIVP []  Lysle Pearl, PharmD, BCPS []  Phillips Climes, PharmD, BCPS []  Agapito Games, PharmD, BCPS []  Verlan Friends, PharmD []  Mervyn Gay, PharmD, BCPS []  Vinnie Level, PharmD  [facility] []  Len Childs, PharmD []  Greer Pickerel, PharmD []  Adalberto Cole, PharmD []  Perlie Gold, Rph []  Lonell Face) Jean Rosenthal, PharmD []  Earl Many, PharmD []  Junita Push, PharmD []  Dorna Leitz, PharmD []  Terrilee Files, PharmD []  Lynann Beaver, PharmD []  Keturah Barre, PharmD []  Loralee Pacas, PharmD []  Bernadene Person, PharmD   Positive urine culture Fosfomycin x 1 dose in ED and no further patient follow-up is required at this time.  Virl Axe [facility] 07/18/2023, 8:45 AM

## 2023-07-31 ENCOUNTER — Ambulatory Visit: Payer: Medicare Other | Admitting: Urology

## 2023-08-03 ENCOUNTER — Other Ambulatory Visit: Payer: Self-pay

## 2023-08-03 ENCOUNTER — Emergency Department (HOSPITAL_COMMUNITY)
Admission: EM | Admit: 2023-08-03 | Discharge: 2023-08-03 | Disposition: A | Discharge status: Home / Self Care | Payer: Medicare Other | Attending: Emergency Medicine | Admitting: Emergency Medicine

## 2023-08-03 ENCOUNTER — Encounter (HOSPITAL_COMMUNITY): Payer: Self-pay

## 2023-08-03 DIAGNOSIS — Y828 Other medical devices associated with adverse incidents: Secondary | ICD-10-CM | POA: Diagnosis not present

## 2023-08-03 DIAGNOSIS — Z7984 Long term (current) use of oral hypoglycemic drugs: Secondary | ICD-10-CM | POA: Insufficient documentation

## 2023-08-03 DIAGNOSIS — I10 Essential (primary) hypertension: Secondary | ICD-10-CM | POA: Diagnosis not present

## 2023-08-03 DIAGNOSIS — T83038A Leakage of other indwelling urethral catheter, initial encounter: Secondary | ICD-10-CM | POA: Diagnosis not present

## 2023-08-03 DIAGNOSIS — T839XXA Unspecified complication of genitourinary prosthetic device, implant and graft, initial encounter: Secondary | ICD-10-CM

## 2023-08-03 DIAGNOSIS — E119 Type 2 diabetes mellitus without complications: Secondary | ICD-10-CM | POA: Diagnosis not present

## 2023-08-03 DIAGNOSIS — T83091A Other mechanical complication of indwelling urethral catheter, initial encounter: Secondary | ICD-10-CM | POA: Diagnosis not present

## 2023-08-03 DIAGNOSIS — J449 Chronic obstructive pulmonary disease, unspecified: Secondary | ICD-10-CM | POA: Insufficient documentation

## 2023-08-03 DIAGNOSIS — Z79899 Other long term (current) drug therapy: Secondary | ICD-10-CM | POA: Diagnosis not present

## 2023-08-03 LAB — URINALYSIS, ROUTINE W REFLEX MICROSCOPIC
Bacteria, UA: NONE SEEN
Bilirubin Urine: NEGATIVE
Glucose, UA: NEGATIVE mg/dL
Ketones, ur: NEGATIVE mg/dL
Nitrite: POSITIVE — AB
Protein, ur: >=300 mg/dL — AB
Specific Gravity, Urine: 1.018 (ref 1.005–1.030)
WBC, UA: >50 WBC/hpf (ref 0–5)
pH: 8 (ref 5.0–8.0)

## 2023-08-03 NOTE — ED Notes (Signed)
Foley removed and replaced  Foley Buddy JP NT

## 2023-08-03 NOTE — ED Triage Notes (Signed)
Cath leaking around penis Placed 2-3 weeks ago Penis burning  Pt stated that leg bag got tugged earlier today

## 2023-08-03 NOTE — ED Notes (Signed)
UA sent to lab Foley care discussed with pt Pt did NOT have leg secure device and was allowing foley to freely hang and pull from penis.

## 2023-08-03 NOTE — Discharge Instructions (Signed)
Follow-up with urology on Monday as planned.

## 2023-08-04 NOTE — ED Provider Notes (Signed)
[place] EMERGENCY DEPARTMENT AT [facility] Provider Note   CSN: [id number] Arrival date & time: 08/03/23  2013     History  Chief Complaint  Patient presents with   Foley Problem    Ronald Ward is a 77 y.o. male.  HPI Patient presents with urine coming from Foley catheter.  Somewhat difficulty and history with sounds of is coming from the end of the catheter and may be out of the penis.  Had been placed around 2 weeks ago.  No fevers.  No abdominal pain.   Past Medical History:  Diagnosis Date   Arthritis    Asthma    COPD (chronic obstructive pulmonary disease) (HCC)    Diabetes mellitus without complication (HCC)    Hypertension    Prostate disease     Home Medications Prior to Admission medications   Medication Sig Start Date End Date Taking? Authorizing Provider  albuterol (PROVENTIL) (2.5 MG/3ML) 0.083% nebulizer solution Take 3 mLs (2.5 mg total) by nebulization every 6 (six) hours as needed for wheezing or shortness of breath. 10/01/22   Smitty Knudsen, PA-C  albuterol (VENTOLIN HFA) 108 (90 Base) MCG/ACT inhaler Inhale 1-2 puffs into the lungs every 6 (six) hours as needed for wheezing or shortness of breath. 12/11/22   Gilmore Laroche, FNP  cholecalciferol (VITAMIN D) 25 MCG (1000 UNIT) tablet Take 5,000 Units by mouth daily.    [provider]  cyclobenzaprine (FLEXERIL) 5 MG tablet TAKE (1) TABLET BY MOUTH AT BEDTIME AS NEEDED. 06/01/23   Gilmore Laroche, FNP  DULoxetine (CYMBALTA) 60 MG capsule Take 1 capsule (60 mg total) by mouth daily. 02/20/23   Gilmore Laroche, FNP  enalapril (VASOTEC) 2.5 MG tablet Take 1 tablet by mouth daily. 08/29/18   [provider]  EPINEPHrine 0.3 mg/0.3 mL IJ SOAJ injection Inject 0.3 mLs (0.3 mg total) into the muscle once as needed (for allergic reaction). 04/08/16   Samuel Jester, DO  metFORMIN (GLUCOPHAGE) 500 MG tablet Take 1 tablet (500 mg total) by mouth 2 (two) times daily with a meal.  05/24/22   Gilmore Laroche, FNP  methylPREDNISolone (MEDROL DOSEPAK) 4 MG TBPK tablet Take as the package instructed 12/11/22   Gilmore Laroche, FNP  predniSONE (DELTASONE) 20 MG tablet Take 2 tablets (40 mg total) by mouth daily with breakfast. 04/24/23   Particia Nearing, PA-C  promethazine-dextromethorphan (PROMETHAZINE-DM) 6.25-15 MG/5ML syrup Take 5 mLs by mouth 4 (four) times daily as needed. 04/24/23   Particia Nearing, PA-C  simvastatin (ZOCOR) 20 MG tablet Take 20 mg by mouth every evening.    [provider]  tamsulosin (FLOMAX) 0.4 MG CAPS capsule TAKE ONE CAPSULE BY MOUTH ONCE DAILY. 06/14/23   Marcine Matar, MD  triamcinolone cream (KENALOG) 0.1 % Apply 1 Application topically 2 (two) times daily. 04/23/22   Gilmore Laroche, FNP      Allergies    Bee venom, Fish allergy, and Shellfish allergy    Review of Systems   Review of Systems  Physical Exam Updated Vital Signs BP (!) 168/94 (BP Location: Right Arm)   Pulse 61   Temp 99.1 F (37.3 C) (Oral)   Resp 16   Ht 5\' 7"  (1.702 m)   Wt 65.8 kg   SpO2 100%   BMI 22.71 kg/m  Physical Exam Vitals reviewed.  Abdominal:     Tenderness: There is no abdominal tenderness.  Genitourinary:    Comments: Foley catheter in place.  Patient indicates some urine came  from the port of the catheter. Skin:    General: Skin is warm.  Neurological:     Mental Status: He is alert and oriented to person, place, and time.     ED Results / Procedures / Treatments   Labs (all labs ordered are listed, but only abnormal results are displayed) Labs Reviewed  URINALYSIS, ROUTINE W REFLEX MICROSCOPIC - Abnormal; Notable for the following components:      Result Value   APPearance TURBID (*)    Hgb urine dipstick MODERATE (*)    Protein, ur >=300 (*)    Nitrite POSITIVE (*)    Leukocytes,Ua MODERATE (*)    All other components within normal limits  URINE CULTURE    EKG None  Radiology No results  found.  Procedures Procedures    Medications Ordered in ED Medications - No data to display  ED Course/ Medical Decision Making/ A&P                                 Medical Decision Making Amount and/or Complexity of Data Reviewed Labs: ordered.    patient with Foley catheter problem.  Replaced Foley.  Urine done and potentially contaminated.  No fevers.  Do not think we need to treat at this time.  Patient states he will follow-up with urology in 2 days.  Will discharge home        Final Clinical Impression(s) / ED Diagnoses Final diagnoses:  Complication of Foley catheter, initial encounter [facility])    Rx / DC Orders ED Discharge Orders     None         Benjiman Core, MD 08/04/23 1442

## 2023-08-07 LAB — URINE CULTURE: Culture: 30000 — AB

## 2023-08-08 ENCOUNTER — Telehealth (HOSPITAL_BASED_OUTPATIENT_CLINIC_OR_DEPARTMENT_OTHER): Payer: Self-pay

## 2023-08-08 NOTE — Telephone Encounter (Signed)
Post ED Visit - Positive Culture Follow-up  Culture report reviewed by antimicrobial stewardship pharmacist: [facility] []  Enzo Bi, Pharm.D. []  Celedonio Miyamoto, Pharm.D., BCPS AQ-ID []  Garvin Fila, Pharm.D., BCPS []  Georgina Pillion, Pharm.D., BCPS []  [place], [address].D., BCPS, AAHIVP []  Estella Husk, Pharm.D., BCPS, AAHIVP []  Lysle Pearl, PharmD, BCPS [x]  Estill Batten, PharmD, BCCCP []  Agapito Games, PharmD, BCPS []  Verlan Friends, PharmD []  Mervyn Gay, PharmD, BCPS []  Vinnie Level, PharmD  [facility] []  Len Childs, PharmD []  Greer Pickerel, PharmD []  Adalberto Cole, PharmD []  Perlie Gold, Rph []  Lonell Face) Jean Rosenthal, PharmD []  Earl Many, PharmD []  Junita Push, PharmD []  Dorna Leitz, PharmD []  Terrilee Files, PharmD []  Lynann Beaver, PharmD []  Keturah Barre, PharmD []  Loralee Pacas, PharmD []  Bernadene Person, PharmD   Positive urine culture Foley problems with foley replaced noted concern urine contaminated. Plans to f/u with urology. No further patient follow-up is required at this time.  Sandria Senter 08/08/2023, 12:22 PM

## 2023-08-08 NOTE — Progress Notes (Signed)
ED Antimicrobial Stewardship Positive Culture Follow Up   Ronald Ward is an 77 y.o. male who presented to [facility] with a chief complaint of  Chief Complaint  Patient presents with   Foley Problem    Recent Results (from the past 720 hours)  Urine Culture     Status: Abnormal   Collection Time: 07/14/23  1:50 PM   Specimen: Urine, Random  Result Value Ref Range Status   Specimen Description   Final    URINE, RANDOM Performed at [facility], [address] [place], [place] [address]    Special Requests   Final    NONE Performed at [facility], [address], [place], [place] [address]    Culture >=100,000 COLONIES/mL PROTEUS MIRABILIS (A)  Final   Report Status 07/17/2023 FINAL  Final   Organism ID, Bacteria PROTEUS MIRABILIS (A)  Final      Susceptibility   Proteus mirabilis - MIC*    AMPICILLIN <=2 SENSITIVE Sensitive     CEFAZOLIN <=4 SENSITIVE Sensitive     CEFEPIME <=0.12 SENSITIVE Sensitive     CEFTRIAXONE <=0.25 SENSITIVE Sensitive     CIPROFLOXACIN <=0.25 SENSITIVE Sensitive     GENTAMICIN <=1 SENSITIVE Sensitive     IMIPENEM 2 SENSITIVE Sensitive     NITROFURANTOIN 128 RESISTANT Resistant     TRIMETH/SULFA <=20 SENSITIVE Sensitive     AMPICILLIN/SULBACTAM <=2 SENSITIVE Sensitive     PIP/TAZO <=4 SENSITIVE Sensitive ug/mL    * >=100,000 COLONIES/mL PROTEUS MIRABILIS  Urine Culture     Status: Abnormal   Collection Time: 08/03/23  8:50 PM   Specimen: Urine, Catheterized  Result Value Ref Range Status   Specimen Description   Final    URINE, CATHETERIZED Performed at [facility], [address] [place], [place] [address]    Special Requests   Final    NONE Performed at [facility], [address], [place], [place] [address]    Culture (A)  Final    30,000 COLONIES/mL ENTEROBACTER CLOACAE 40,000 COLONIES/mL PROTEUS MIRABILIS    Report Status 08/07/2023 FINAL  Final   Organism ID, Bacteria ENTEROBACTER CLOACAE (A)  Final   Organism ID, Bacteria  PROTEUS MIRABILIS (A)  Final      Susceptibility   Enterobacter cloacae - MIC*    CEFEPIME 2 SENSITIVE Sensitive     CIPROFLOXACIN <=0.25 SENSITIVE Sensitive     GENTAMICIN <=1 SENSITIVE Sensitive     IMIPENEM 0.5 SENSITIVE Sensitive     NITROFURANTOIN 64 INTERMEDIATE Intermediate     TRIMETH/SULFA <=20 SENSITIVE Sensitive     PIP/TAZO >=128 RESISTANT Resistant ug/mL    * 30,000 COLONIES/mL ENTEROBACTER CLOACAE   Proteus mirabilis - MIC*    AMPICILLIN <=2 SENSITIVE Sensitive     CEFAZOLIN <=4 SENSITIVE Sensitive     CEFEPIME <=0.12 SENSITIVE Sensitive     CEFTRIAXONE <=0.25 SENSITIVE Sensitive     CIPROFLOXACIN <=0.25 SENSITIVE Sensitive     GENTAMICIN <=1 SENSITIVE Sensitive     IMIPENEM 1 SENSITIVE Sensitive     NITROFURANTOIN 128 RESISTANT Resistant     TRIMETH/SULFA <=20 SENSITIVE Sensitive     AMPICILLIN/SULBACTAM <=2 SENSITIVE Sensitive     PIP/TAZO <=4 SENSITIVE Sensitive ug/mL    * 40,000 COLONIES/mL PROTEUS MIRABILIS    Reviewed with MD - likely colonization from foley, no changes.   ED Provider: Margarita Grizzle, MD   Estill Batten, PharmD, BCCCP  08/08/2023, 10:00 AM Clinical Pharmacist Monday - Friday phone -  [phone] Saturday -  Sunday phone - [phone]

## 2023-08-14 ENCOUNTER — Other Ambulatory Visit: Payer: Self-pay

## 2023-08-14 ENCOUNTER — Emergency Department (HOSPITAL_COMMUNITY)
Admission: EM | Admit: 2023-08-14 | Discharge: 2023-08-14 | Disposition: A | Discharge status: Home / Self Care | Payer: Medicare Other | Attending: Emergency Medicine | Admitting: Emergency Medicine

## 2023-08-14 ENCOUNTER — Encounter (HOSPITAL_COMMUNITY): Payer: Self-pay

## 2023-08-14 DIAGNOSIS — T83091A Other mechanical complication of indwelling urethral catheter, initial encounter: Secondary | ICD-10-CM | POA: Diagnosis not present

## 2023-08-14 DIAGNOSIS — N390 Urinary tract infection, site not specified: Secondary | ICD-10-CM | POA: Diagnosis not present

## 2023-08-14 DIAGNOSIS — B9689 Other specified bacterial agents as the cause of diseases classified elsewhere: Secondary | ICD-10-CM | POA: Diagnosis not present

## 2023-08-14 DIAGNOSIS — T839XXA Unspecified complication of genitourinary prosthetic device, implant and graft, initial encounter: Secondary | ICD-10-CM

## 2023-08-14 DIAGNOSIS — T83098A Other mechanical complication of other indwelling urethral catheter, initial encounter: Secondary | ICD-10-CM | POA: Diagnosis not present

## 2023-08-14 LAB — URINALYSIS, ROUTINE W REFLEX MICROSCOPIC
Bilirubin Urine: NEGATIVE
Glucose, UA: 50 mg/dL — AB
Ketones, ur: NEGATIVE mg/dL
Nitrite: POSITIVE — AB
Protein, ur: >=300 mg/dL — AB
RBC / HPF: >50 RBC/hpf (ref 0–5)
Specific Gravity, Urine: 1.014 (ref 1.005–1.030)
WBC, UA: >50 WBC/hpf (ref 0–5)
pH: 8 (ref 5.0–8.0)

## 2023-08-14 MED ORDER — LIDOCAINE HCL URETHRAL/MUCOSAL 2 % EX GEL
1.0000 | Freq: Once | CUTANEOUS | Status: AC
  Administered 2023-08-14: 1 via URETHRAL
  Filled 2023-08-14: qty 10

## 2023-08-14 MED ORDER — SULFAMETHOXAZOLE-TRIMETHOPRIM 800-160 MG PO TABS: 1.0000 | ORAL_TABLET | Freq: Two times a day (BID) | ORAL | 0 refills | Status: AC

## 2023-08-14 MED ORDER — SULFAMETHOXAZOLE-TRIMETHOPRIM 800-160 MG PO TABS
1.0000 | ORAL_TABLET | Freq: Once | ORAL | Status: AC
  Administered 2023-08-14: 1 via ORAL
  Filled 2023-08-14: qty 1

## 2023-08-14 NOTE — Discharge Instructions (Addendum)
 You were seen in the emerged part for issues with your Foley catheter The catheter was painful and leaking We replaced the catheter and it seems to be working well and you are more comfortable Your urine was concerning for potential UTI but we will need to follow-up with culture We will start you on antibiotics to pick up from your pharmacy in case there is a UTI present Please follow-up with your primary care doctor within 1 week for reevaluation Return to the emerged department for catheter issues

## 2023-08-14 NOTE — ED Notes (Addendum)
 Pt stated he had his cathter inserted this past week. This is the 3rd one in 4 months. Stated the second one had this same problem. Stated it is painful and has been experiencing leakage from both bag and insertion site. Denies seeing any blood.   There is an odor coming from the bag

## 2023-08-14 NOTE — ED Notes (Signed)
Pt ambulated to restroom with NT.

## 2023-08-14 NOTE — ED Provider Notes (Signed)
 [place] EMERGENCY DEPARTMENT AT [facility] Provider Note   CSN: [id number] Arrival date & time: 08/14/23  1613     History  Chief Complaint  Patient presents with   foley leaking    Ronald Ward is a 78 y.o. male.  With a history of BPH and indwelling Foley who presents to the ED for Foley catheter issue.  Foley catheter placed about a week ago.  Patient reports significant discomfort in his penis around that urethra and has noted some urine leaking out at the site of insertion.  No fevers or chills.  No other complaints at this time  HPI     Home Medications Prior to Admission medications   Medication Sig Start Date End Date Taking? Authorizing Provider  sulfamethoxazole -trimethoprim  (BACTRIM  DS) 800-160 MG tablet Take 1 tablet by mouth 2 (two) times daily for 7 days. 08/14/23 08/21/23 Yes Pamella Ozell LABOR, DO  albuterol  (PROVENTIL ) (2.5 MG/3ML) 0.083% nebulizer solution Take 3 mLs (2.5 mg total) by nebulization every 6 (six) hours as needed for wheezing or shortness of breath. 10/01/22   Zelaya, Oscar A, PA-C  albuterol  (VENTOLIN  HFA) 108 (90 Base) MCG/ACT inhaler Inhale 1-2 puffs into the lungs every 6 (six) hours as needed for wheezing or shortness of breath. 12/11/22   Zarwolo, Gloria, FNP  cholecalciferol (VITAMIN D ) 25 MCG (1000 UNIT) tablet Take 5,000 Units by mouth daily.    [provider]  cyclobenzaprine  (FLEXERIL ) 5 MG tablet TAKE (1) TABLET BY MOUTH AT BEDTIME AS NEEDED. 06/01/23   Zarwolo, Gloria, FNP  DULoxetine  (CYMBALTA ) 60 MG capsule Take 1 capsule (60 mg total) by mouth daily. 02/20/23   Zarwolo, Gloria, FNP  enalapril  (VASOTEC ) 2.5 MG tablet Take 1 tablet by mouth daily. 08/29/18   [provider]  EPINEPHrine  0.3 mg/0.3 mL IJ SOAJ injection Inject 0.3 mLs (0.3 mg total) into the muscle once as needed (for allergic reaction). 04/08/16   Joyice Sauer, DO  metFORMIN  (GLUCOPHAGE ) 500 MG tablet Take 1 tablet (500 mg total) by mouth 2 (two)  times daily with a meal. 05/24/22   Zarwolo, Gloria, FNP  methylPREDNISolone  (MEDROL  DOSEPAK) 4 MG TBPK tablet Take as the package instructed 12/11/22   Zarwolo, Gloria, FNP  predniSONE  (DELTASONE ) 20 MG tablet Take 2 tablets (40 mg total) by mouth daily with breakfast. 04/24/23   Stuart Vernell Norris, PA-C  promethazine -dextromethorphan (PROMETHAZINE -DM) 6.25-15 MG/5ML syrup Take 5 mLs by mouth 4 (four) times daily as needed. 04/24/23   Stuart Vernell Norris, PA-C  simvastatin (ZOCOR) 20 MG tablet Take 20 mg by mouth every evening.    [provider]  tamsulosin  (FLOMAX ) 0.4 MG CAPS capsule TAKE ONE CAPSULE BY MOUTH ONCE DAILY. 06/14/23   Matilda Senior, MD  triamcinolone  cream (KENALOG ) 0.1 % Apply 1 Application topically 2 (two) times daily. 04/23/22   Zarwolo, Gloria, FNP      Allergies    Bee venom, Fish allergy, and Shellfish allergy    Review of Systems   Review of Systems  Physical Exam Updated Vital Signs BP 109/64   Pulse 71   Temp 97.9 F (36.6 C) (Oral)   Resp 16   Ht 5' 7 (1.702 m)   Wt 65.8 kg   SpO2 93%   BMI 22.71 kg/m  Physical Exam Vitals and nursing note reviewed.  HENT:     Head: Normocephalic and atraumatic.  Eyes:     Pupils: Pupils are equal, round, and reactive to light.  Cardiovascular:  Rate and Rhythm: Normal rate and regular rhythm.  Pulmonary:     Effort: Pulmonary effort is normal.     Breath sounds: Normal breath sounds.  Abdominal:     Palpations: Abdomen is soft.     Tenderness: There is no abdominal tenderness.  Genitourinary:    Penis: Normal.      Testes: Normal.  Skin:    General: Skin is warm and dry.  Neurological:     Mental Status: He is alert.  Psychiatric:        Mood and Affect: Mood normal.     ED Results / Procedures / Treatments   Labs (all labs ordered are listed, but only abnormal results are displayed) Labs Reviewed  URINALYSIS, ROUTINE W REFLEX MICROSCOPIC - Abnormal; Notable for the following  components:      Result Value   APPearance TURBID (*)    Glucose, UA 50 (*)    Hgb urine dipstick MODERATE (*)    Protein, ur >=300 (*)    Nitrite POSITIVE (*)    Leukocytes,Ua MODERATE (*)    Bacteria, UA MANY (*)    All other components within normal limits    EKG None  Radiology No results found.  Procedures Procedures    Medications Ordered in ED Medications  sulfamethoxazole -trimethoprim  (BACTRIM  DS) 800-160 MG per tablet 1 tablet (has no administration in time range)  lidocaine  (XYLOCAINE ) 2 % jelly 1 Application (1 Application Urethral Given by Other 08/14/23 2011)    ED Course/ Medical Decision Making/ A&P                                 Medical Decision Making 78 year old male with indwelling Foley catheter in the setting of prostate enlargement presenting for pain and leaking of catheter.  Or nursing staff promptly remove the painful catheter.  External examination of penis and testes unremarkable.  We were easily able to replace the catheter which patient feels is more comfortable now.  It is unclear what the issue was with the last catheter.  A sample taken with a new catheter was suggestive of urinary tract infection but we will need to send for culture.  Will treat empirically with Bactrim  for suspected catheter associated UTI and instruct him to follow-up with this doctor for culture results to see if we need to continue the antibiotics.  Stable for discharge with new catheter in place  Amount and/or Complexity of Data Reviewed Labs: ordered.  Risk Prescription drug management.           Final Clinical Impression(s) / ED Diagnoses Final diagnoses:  Foley catheter problem, initial encounter (HCC)  Urinary tract infection without hematuria, site unspecified    Rx / DC Orders ED Discharge Orders          Ordered    sulfamethoxazole -trimethoprim  (BACTRIM  DS) 800-160 MG tablet  2 times daily        08/14/23 2227              Pamella Ozell LABOR, DO 08/15/23 0129

## 2023-08-14 NOTE — ED Triage Notes (Addendum)
 Pt reports his foley is leaking from the bag and around his urethra.  Pt would like a leg bag and not a BSD.  Pt reports having bladder spasms.

## 2023-08-21 ENCOUNTER — Telehealth: Payer: Self-pay

## 2023-08-21 NOTE — Progress Notes (Signed)
 Transition Care Management Unsuccessful Follow-up Telephone Call  Date of discharge and from where:  08/13/2022 [facility]   Attempts:  1st Attempt  Reason for unsuccessful TCM follow-up call:  Unable to reach patient patient unable to come to phone ask that I call back.  Ronald Ward [facility]  [facility], [facility] Guide Direct Dial: [phone]  Website: [email or web address]

## 2023-08-22 ENCOUNTER — Telehealth: Payer: Self-pay

## 2023-08-22 NOTE — Progress Notes (Signed)
 Transition Care Management Unsuccessful Follow-up Telephone Call  Date of discharge and from where:  08/14/2023 [facility]  Attempts:  2nd Attempt  Reason for unsuccessful TCM follow-up call:  No answer/busy  Lisha Vitale [facility]  [facility], [facility] Resource Care Guide Direct Dial: [phone]  Website: [email or web address]

## 2023-09-03 ENCOUNTER — Ambulatory Visit: Payer: Medicare Other | Admitting: Urology

## 2023-09-03 ENCOUNTER — Other Ambulatory Visit: Payer: Self-pay

## 2023-09-03 ENCOUNTER — Other Ambulatory Visit
Admission: RE | Admit: 2023-09-03 | Discharge: 2023-09-03 | Disposition: A | Discharge status: Home / Self Care | Payer: Medicare Other | Attending: Urology | Admitting: Urology

## 2023-09-03 VITALS — BP 153/85 | HR 96 | Ht 67.0 in

## 2023-09-03 DIAGNOSIS — N401 Enlarged prostate with lower urinary tract symptoms: Secondary | ICD-10-CM | POA: Diagnosis present

## 2023-09-03 DIAGNOSIS — R338 Other retention of urine: Secondary | ICD-10-CM | POA: Diagnosis not present

## 2023-09-03 DIAGNOSIS — N138 Other obstructive and reflux uropathy: Secondary | ICD-10-CM

## 2023-09-03 LAB — URINALYSIS, COMPLETE (UACMP) WITH MICROSCOPIC
Bilirubin Urine: NEGATIVE
Glucose, UA: 500 mg/dL — AB
Ketones, ur: NEGATIVE mg/dL
Nitrite: NEGATIVE
Protein, ur: 100 mg/dL — AB
Specific Gravity, Urine: >1.03 — ABNORMAL HIGH (ref 1.005–1.030)
pH: 5 (ref 5.0–8.0)

## 2023-09-03 NOTE — Progress Notes (Unsigned)
Surgical Physician Order Form [facility] [place]  Dr. Legrand Rams, MD   * Scheduling expectation : 1/31   *Length of Case: 2 hours   *Clearance needed: no   *Anticoagulation Instructions: Hold all anticoagulants   *Aspirin Instructions: Hold Aspirin   *Post-op visit Date/Instructions:  1-3 day cath removal   *Diagnosis: BPH w/urinary obstruction   *Procedure:  HOLEP (32440)     Additional orders: N/A   -Admit type: OUTpatient   -Anesthesia: General   -VTE Prophylaxis Standing Order SCD's       Other:    -Standing Lab Orders Per Anesthesia     Lab other: UA&Urine Culture sent 1/21   -Standing Test orders EKG/Chest x-ray per Anesthesia             Test other:    - Medications: Cipro 400 mg IV   -Other orders:  N/A

## 2023-09-03 NOTE — H&P (View-Only) (Signed)
 09/03/23 2:32 PM   Ronald Ward 01-06-1946 [id number]  CC: BPH and urinary retention  HPI: 78 year old male referred from Dr. Retta Diones for consideration of HOLEP.  He has a long history of BPH and recurrent urinary retention, bladder currently managed with a Foley catheter since at least October 2024.  Prostate measures 180 g on most recent imaging from CT June 2023.  He also has a long history of elevated PSA values with multiple negative prostate biopsies, including an MRI fusion biopsy that was benign for a PI-RADS 3 lesion in 2020.  PSA has been stable around 15 since that time.   PMH: Past Medical History:  Diagnosis Date   Arthritis    Asthma    COPD (chronic obstructive pulmonary disease) (HCC)    Diabetes mellitus without complication (HCC)    Hypertension    Prostate disease     Surgical History: Past Surgical History:  Procedure Laterality Date   ANKLE SURGERY     Arm surgery     COLONOSCOPY N/A 11/22/2016   Procedure: COLONOSCOPY;  Surgeon: Malissa Hippo, MD;  Location: [facility];  Service: Endoscopy;  Laterality: N/A;  930   PENILE ADHESIONS LYSIS      Family History: Family History  Problem Relation Age of Onset   Heart attack Mother    Diabetes Mother    Diabetes Brother    Cancer Brother     Social History:  reports that he has been smoking cigarettes. He started smoking about 63 years ago. He has a 48.5 pack-year smoking history. He has never used smokeless tobacco. He reports that he does not currently use alcohol. He reports that he does not use drugs.  Physical Exam: BP (!) 153/85 (BP Location: Left Arm, Patient Position: Sitting, Cuff Size: Normal)   Pulse 96   Ht 5\' 7"  (1.702 m)   BMI 22.71 kg/m    Constitutional:  Alert and oriented, No acute distress. Cardiovascular: No clubbing, cyanosis, or edema. Respiratory: Normal respiratory effort, no increased work of breathing. GI: Abdomen is soft, nontender, nondistended, no abdominal  masses   Laboratory Data: Urine culture today pending, prior labs reviewed  Pertinent Imaging: I have personally viewed and interpreted the CT showing a 180 g prostate.  Assessment & Plan:   78 year old male referred from Dr. Retta Diones for consideration of HOLEP.  Prostate measured 180 g on CT June 2023.  Long history of elevated PSA up to 15, multiple negative biopsies including a negative MRI fusion biopsy.  Currently Foley dependent urinary retention, multiple problems with catheter leakage and recurrent UTIs.  He is interested in definitive treatment with HOLEP.  We discussed the risks and benefits of HoLEP at length.  The procedure requires general anesthesia and takes 1 to 2 hours, and a holmium laser is used to enucleate the prostate and push this tissue into the bladder.  A morcellator is then used to remove this tissue, which is sent for pathology.  The vast majority(>95%) of patients are able to discharge the same day with a catheter in place for 2 to 3 days, and will follow-up in clinic for a voiding trial.  We specifically discussed the risks of bleeding, infection, retrograde ejaculation, temporary urgency and urge incontinence, very low risk of long-term incontinence, urethral stricture/bladder neck contracture, pathologic evaluation of prostate tissue and possible detection of prostate cancer or other malignancy, and possible need for additional procedures.  Urine today sent for preop urine culture Schedule HOLEP  Legrand Rams,  MD 09/03/2023  [facility] [address], [address] [place], [place] [address] [phone]

## 2023-09-03 NOTE — Progress Notes (Signed)
09/03/23 2:32 PM   Ronald Ward 01-06-1946 [id number]  CC: BPH and urinary retention  HPI: 78 year old male referred from Dr. Retta Diones for consideration of HOLEP.  He has a long history of BPH and recurrent urinary retention, bladder currently managed with a Foley catheter since at least October 2024.  Prostate measures 180 g on most recent imaging from CT June 2023.  He also has a long history of elevated PSA values with multiple negative prostate biopsies, including an MRI fusion biopsy that was benign for a PI-RADS 3 lesion in 2020.  PSA has been stable around 15 since that time.   PMH: Past Medical History:  Diagnosis Date   Arthritis    Asthma    COPD (chronic obstructive pulmonary disease) (HCC)    Diabetes mellitus without complication (HCC)    Hypertension    Prostate disease     Surgical History: Past Surgical History:  Procedure Laterality Date   ANKLE SURGERY     Arm surgery     COLONOSCOPY N/A 11/22/2016   Procedure: COLONOSCOPY;  Surgeon: Malissa Hippo, MD;  Location: [facility];  Service: Endoscopy;  Laterality: N/A;  930   PENILE ADHESIONS LYSIS      Family History: Family History  Problem Relation Age of Onset   Heart attack Mother    Diabetes Mother    Diabetes Brother    Cancer Brother     Social History:  reports that he has been smoking cigarettes. He started smoking about 63 years ago. He has a 48.5 pack-year smoking history. He has never used smokeless tobacco. He reports that he does not currently use alcohol. He reports that he does not use drugs.  Physical Exam: BP (!) 153/85 (BP Location: Left Arm, Patient Position: Sitting, Cuff Size: Normal)   Pulse 96   Ht 5\' 7"  (1.702 m)   BMI 22.71 kg/m    Constitutional:  Alert and oriented, No acute distress. Cardiovascular: No clubbing, cyanosis, or edema. Respiratory: Normal respiratory effort, no increased work of breathing. GI: Abdomen is soft, nontender, nondistended, no abdominal  masses   Laboratory Data: Urine culture today pending, prior labs reviewed  Pertinent Imaging: I have personally viewed and interpreted the CT showing a 180 g prostate.  Assessment & Plan:   78 year old male referred from Dr. Retta Diones for consideration of HOLEP.  Prostate measured 180 g on CT June 2023.  Long history of elevated PSA up to 15, multiple negative biopsies including a negative MRI fusion biopsy.  Currently Foley dependent urinary retention, multiple problems with catheter leakage and recurrent UTIs.  He is interested in definitive treatment with HOLEP.  We discussed the risks and benefits of HoLEP at length.  The procedure requires general anesthesia and takes 1 to 2 hours, and a holmium laser is used to enucleate the prostate and push this tissue into the bladder.  A morcellator is then used to remove this tissue, which is sent for pathology.  The vast majority(>95%) of patients are able to discharge the same day with a catheter in place for 2 to 3 days, and will follow-up in clinic for a voiding trial.  We specifically discussed the risks of bleeding, infection, retrograde ejaculation, temporary urgency and urge incontinence, very low risk of long-term incontinence, urethral stricture/bladder neck contracture, pathologic evaluation of prostate tissue and possible detection of prostate cancer or other malignancy, and possible need for additional procedures.  Urine today sent for preop urine culture Schedule HOLEP  Legrand Rams,  MD 09/03/2023  [facility] [address], [address] [place], [place] [address] [phone]

## 2023-09-03 NOTE — Patient Instructions (Signed)

## 2023-09-04 ENCOUNTER — Telehealth: Payer: Self-pay

## 2023-09-04 NOTE — Progress Notes (Signed)
   [facility] Surgical Posting Form  Surgery Date: Date: 09/13/2023  Surgeon: Dr. Legrand Rams, MD2  Inpt ( No  )   Outpt (Yes)   Obs ( No  )   Diagnosis: N40.1, N13.8 Benign Prostatic Hyperplasia with Urinary Obstruction  -CPT: 78295  Surgery: Holmium Laser Enucleation of the Prostate  Stop Anticoagulations: Yes and hold ASA  Cardiac/Medical/Pulmonary Clearance needed: no  *Orders entered into EPIC  Date: 09/04/23   *Case booked in [place]  Date: 09/04/23  *Notified pt of Surgery: Date: 09/04/23  PRE-OP UA & CX: obtained in clinic on 09/03/23  *Placed into Prior Authorization Work Angela Nevin Date: 09/04/23  Assistant/laser/rep:No

## 2023-09-04 NOTE — Telephone Encounter (Signed)
Per Dr. Richardo Hanks,  Patient is to be scheduled for  Holmium Laser Enucleation of the Prostate   Mr. Ronald Ward was contacted and possible surgical dates were discussed, Friday January 31st, 2025 was agreed upon for surgery.   Patient was directed to call [phone] between 1-3pm the day before surgery to find out surgical arrival time.  Instructions were given not to eat or drink from midnight on the night before surgery and have a driver for the day of surgery. On the surgery day patient was instructed to enter through the Medical Mall entrance of [facility] report the Same Day Surgery desk.   Pre-Admit Testing will be in contact via phone to set up an interview with the anesthesia team to review your history and medications prior to surgery.   Reminder of this information was sent via mail to the patient.

## 2023-09-05 ENCOUNTER — Telehealth: Payer: Self-pay

## 2023-09-05 LAB — URINE CULTURE: Culture: >=100000 — AB

## 2023-09-05 MED ORDER — AMOXICILLIN 500 MG PO TABS: 500.0000 mg | ORAL_TABLET | Freq: Two times a day (BID) | ORAL | 0 refills | Status: DC

## 2023-09-05 NOTE — Telephone Encounter (Signed)
-----   Message from Sondra Come sent at 09/05/2023  2:36 PM EST ----- Please start amoxicillin 500 mg twice daily x 10 days to sterilize urine prior to upcoming HOLEP  Legrand Rams, MD 09/05/2023

## 2023-09-08 ENCOUNTER — Emergency Department (HOSPITAL_COMMUNITY)
Admission: EM | Admit: 2023-09-08 | Discharge: 2023-09-09 | Disposition: A | Discharge status: Home / Self Care | Payer: Medicare Other | Attending: Emergency Medicine | Admitting: Emergency Medicine

## 2023-09-08 ENCOUNTER — Encounter (HOSPITAL_COMMUNITY): Payer: Self-pay

## 2023-09-08 ENCOUNTER — Other Ambulatory Visit: Payer: Self-pay

## 2023-09-08 DIAGNOSIS — T83098A Other mechanical complication of other indwelling urethral catheter, initial encounter: Secondary | ICD-10-CM | POA: Insufficient documentation

## 2023-09-08 DIAGNOSIS — J45909 Unspecified asthma, uncomplicated: Secondary | ICD-10-CM | POA: Insufficient documentation

## 2023-09-08 DIAGNOSIS — Y69 Unspecified misadventure during surgical and medical care: Secondary | ICD-10-CM | POA: Diagnosis not present

## 2023-09-08 DIAGNOSIS — E119 Type 2 diabetes mellitus without complications: Secondary | ICD-10-CM | POA: Diagnosis not present

## 2023-09-08 DIAGNOSIS — Z79899 Other long term (current) drug therapy: Secondary | ICD-10-CM | POA: Diagnosis not present

## 2023-09-08 DIAGNOSIS — T839XXA Unspecified complication of genitourinary prosthetic device, implant and graft, initial encounter: Secondary | ICD-10-CM

## 2023-09-08 DIAGNOSIS — J449 Chronic obstructive pulmonary disease, unspecified: Secondary | ICD-10-CM | POA: Diagnosis not present

## 2023-09-08 DIAGNOSIS — Z7984 Long term (current) use of oral hypoglycemic drugs: Secondary | ICD-10-CM | POA: Diagnosis not present

## 2023-09-08 DIAGNOSIS — R9431 Abnormal electrocardiogram [ECG] [EKG]: Secondary | ICD-10-CM | POA: Diagnosis not present

## 2023-09-08 DIAGNOSIS — T83091A Other mechanical complication of indwelling urethral catheter, initial encounter: Secondary | ICD-10-CM | POA: Diagnosis not present

## 2023-09-08 DIAGNOSIS — R339 Retention of urine, unspecified: Secondary | ICD-10-CM | POA: Diagnosis present

## 2023-09-08 LAB — URINALYSIS, W/ REFLEX TO CULTURE (INFECTION SUSPECTED)
Bacteria, UA: NONE SEEN
Bilirubin Urine: NEGATIVE
Glucose, UA: 50 mg/dL — AB
Ketones, ur: NEGATIVE mg/dL
Nitrite: NEGATIVE
Protein, ur: 30 mg/dL — AB
RBC / HPF: >50 RBC/hpf (ref 0–5)
Specific Gravity, Urine: 1.021 (ref 1.005–1.030)
pH: 5 (ref 5.0–8.0)

## 2023-09-08 MED ORDER — MORPHINE SULFATE (PF) 4 MG/ML IV SOLN
4.0000 mg | Freq: Once | INTRAVENOUS | Status: AC
  Administered 2023-09-08: 4 mg via INTRAMUSCULAR
  Filled 2023-09-08: qty 1

## 2023-09-08 MED ORDER — IPRATROPIUM-ALBUTEROL 0.5-2.5 (3) MG/3ML IN SOLN
3.0000 mL | Freq: Once | RESPIRATORY_TRACT | Status: AC
  Administered 2023-09-08: 3 mL via RESPIRATORY_TRACT
  Filled 2023-09-08: qty 3

## 2023-09-08 MED ORDER — LIDOCAINE HCL URETHRAL/MUCOSAL 2 % EX GEL
1.0000 | Freq: Once | CUTANEOUS | Status: AC
  Administered 2023-09-08: 1 via URETHRAL
  Filled 2023-09-08: qty 10

## 2023-09-08 NOTE — ED Triage Notes (Signed)
Pt reports he has a foley catheter in and is having tremendous pain in his penis.

## 2023-09-08 NOTE — ED Provider Notes (Signed)
[place] EMERGENCY DEPARTMENT AT [facility] Provider Note   CSN: [id number] Arrival date & time: 09/08/23  2045     History  Chief Complaint  Patient presents with   Penis Pain    Ronald Ward is a 78 y.o. male.  Pt is a 78 yo male with pmhx significant for dm, copd, htn, asthma, arthritis, and BPH causing urinary retention.  Pt said he was changing his foley bag and accidentally pulled his catheter a little.  He is having a lot of pain.  FC last changed when he was here on 1/1.  Pt is scheduled to have laser surgery on his prostate on 1/31.  Pt is also having some sob.         Home Medications Prior to Admission medications   Medication Sig Start Date End Date Taking? Authorizing Provider  albuterol (PROVENTIL) (2.5 MG/3ML) 0.083% nebulizer solution Take 3 mLs (2.5 mg total) by nebulization every 6 (six) hours as needed for wheezing or shortness of breath. 10/01/22   Smitty Knudsen, PA-C  albuterol (VENTOLIN HFA) 108 (90 Base) MCG/ACT inhaler Inhale 1-2 puffs into the lungs every 6 (six) hours as needed for wheezing or shortness of breath. 12/11/22   Gilmore Laroche, FNP  amoxicillin (AMOXIL) 500 MG tablet Take 1 tablet (500 mg total) by mouth every 12 (twelve) hours. 09/05/23   Sondra Come, MD  Cholecalciferol (VITAMIN D3 ULTRA STRENGTH) 125 MCG (5000 UT) capsule Take 5,000 Units by mouth daily.    [provider]  cyclobenzaprine (FLEXERIL) 5 MG tablet TAKE (1) TABLET BY MOUTH AT BEDTIME AS NEEDED. 06/01/23   Gilmore Laroche, FNP  DULoxetine (CYMBALTA) 60 MG capsule Take 1 capsule (60 mg total) by mouth daily. 02/20/23   Gilmore Laroche, FNP  enalapril (VASOTEC) 2.5 MG tablet Take 2.5 mg by mouth daily as needed (hight blood pressure). 08/29/18   [provider]  EPINEPHrine 0.3 mg/0.3 mL IJ SOAJ injection Inject 0.3 mLs (0.3 mg total) into the muscle once as needed (for allergic reaction). 04/08/16   Samuel Jester, DO  gabapentin (NEURONTIN)  300 MG capsule Take 300 mg by mouth 3 (three) times daily.    [provider]  metFORMIN (GLUCOPHAGE) 500 MG tablet Take 1 tablet (500 mg total) by mouth 2 (two) times daily with a meal. Patient taking differently: Take 500 mg by mouth 2 (two) times daily as needed (high blood sugar). 05/24/22   Gilmore Laroche, FNP      Allergies    Bee venom, Fish allergy, and Shellfish allergy    Review of Systems   Review of Systems  Respiratory:  Positive for shortness of breath.   Genitourinary:  Positive for dysuria.  All other systems reviewed and are negative.   Physical Exam Updated Vital Signs BP 122/69   Pulse 84   Temp 98.7 F (37.1 C) (Oral)   Resp 19   Ht 5\' 7"  (1.702 m)   Wt 65.8 kg   SpO2 95%   BMI 22.71 kg/m  Physical Exam Vitals and nursing note reviewed.  Constitutional:      General: He is in acute distress.     Appearance: Normal appearance.  HENT:     Head: Normocephalic and atraumatic.     Right Ear: External ear normal.     Left Ear: External ear normal.     Nose: Nose normal.     Mouth/Throat:     Mouth: Mucous membranes are moist.  Pharynx: Oropharynx is clear.  Eyes:     Extraocular Movements: Extraocular movements intact.     Conjunctiva/sclera: Conjunctivae normal.     Pupils: Pupils are equal, round, and reactive to light.  Cardiovascular:     Rate and Rhythm: Normal rate and regular rhythm.     Pulses: Normal pulses.     Heart sounds: Normal heart sounds.  Pulmonary:     Breath sounds: Wheezing present.  Abdominal:     General: Abdomen is flat. Bowel sounds are normal.     Palpations: Abdomen is soft.  Genitourinary:    Comments: Foley catheter with discharge around urethra; cloudy urine in bag Musculoskeletal:        General: Normal range of motion.     Cervical back: Normal range of motion and neck supple.  Skin:    General: Skin is warm.     Capillary Refill: Capillary refill takes less than 2 seconds.  Neurological:      General: No focal deficit present.     Mental Status: He is alert and oriented to person, place, and time.  Psychiatric:        Mood and Affect: Mood normal.        Behavior: Behavior normal.     ED Results / Procedures / Treatments   Labs (all labs ordered are listed, but only abnormal results are displayed) Labs Reviewed  URINALYSIS, W/ REFLEX TO CULTURE (INFECTION SUSPECTED) - Abnormal; Notable for the following components:      Result Value   APPearance HAZY (*)    Glucose, UA 50 (*)    Hgb urine dipstick LARGE (*)    Protein, ur 30 (*)    Leukocytes,Ua MODERATE (*)    All other components within normal limits  URINE CULTURE    EKG None  Radiology No results found.  Procedures Procedures    Medications Ordered in ED Medications  ipratropium-albuterol (DUONEB) 0.5-2.5 (3) MG/3ML nebulizer solution 3 mL (3 mLs Nebulization Given 09/08/23 2204)  morphine (PF) 4 MG/ML injection 4 mg (4 mg Intramuscular Given 09/08/23 2159)  lidocaine (XYLOCAINE) 2 % jelly 1 Application (1 Application Urethral Given 09/08/23 2200)    ED Course/ Medical Decision Making/ A&P                                 Medical Decision Making Amount and/or Complexity of Data Reviewed Labs: ordered.  Risk Prescription drug management.   This patient presents to the ED for concern of foley pain, this involves an extensive number of treatment options, and is a complaint that carries with it a high risk of complications and morbidity.  The differential diagnosis includes foley dislodgement, uti, local irritation   Co morbidities that complicate the patient evaluation  dm, copd, htn, asthma, arthritis, and BPH causing urinary retention   Additional history obtained:  Additional history obtained from epic chart review External records from outside source obtained and reviewed including son   Lab Tests:  I Ordered, and personally interpreted labs.  The pertinent results include:  UA +rbcs,  mod le, but no bacteria   Medicines ordered and prescription drug management:  I ordered medication including morphine/duoneb  for sx  Reevaluation of the patient after these medicines showed that the patient improved I have reviewed the patients home medicines and have made adjustments as needed   Critical Interventions:  Foley change   Problem List / ED Course:  Foley problem:  nursing staff changed foley and sent urine from new foley. UA neg for UTI, but sent for cx.  Pt feels much better now that new FC is in place. COPD:  breathing is improved.   Reevaluation:  After the interventions noted above, I reevaluated the patient and found that they have :improved   Social Determinants of Health:  Lives at home   Dispostion:  After consideration of the diagnostic results and the patients response to treatment, I feel that the patent would benefit from discharge with outpatient f/u.          Final Clinical Impression(s) / ED Diagnoses Final diagnoses:  Problem with Foley catheter, initial encounter [facility])    Rx / DC Orders ED Discharge Orders     None         Jacalyn Lefevre, MD 09/08/23 2351

## 2023-09-09 ENCOUNTER — Inpatient Hospital Stay
Admission: RE | Admit: 2023-09-09 | Discharge: 2023-09-09 | Disposition: A | Discharge status: Home / Self Care | Payer: Medicare Other | Source: Ambulatory Visit

## 2023-09-09 HISTORY — DX: Unspecified disorder of synovium and tendon, right shoulder: M67.911

## 2023-09-09 HISTORY — DX: Unspecified disorder of synovium and tendon, right shoulder: M67.912

## 2023-09-09 HISTORY — DX: Spondylosis without myelopathy or radiculopathy, cervical region: M47.812

## 2023-09-09 NOTE — Patient Instructions (Addendum)
Your procedure is scheduled on:  Friday January 31 Report to the Registration Desk on the 1st floor of the [facility] To find out your arrival time, please call [phone] between 1PM - 3PM on:   Thursday January 30  If your arrival time is 6:00 am, do not arrive before that time as the Medical Mall entrance doors do not open until 6:00 am.  REMEMBER: Instructions that are not followed completely may result in serious medical risk, up to and including death; or upon the discretion of your surgeon and anesthesiologist your surgery may need to be rescheduled.  Do not eat food after midnight the night before surgery.  No gum chewing or hard candies.  One week prior to surgery: Friday January 24 Stop Anti-inflammatories (NSAIDS) such as Advil, Aleve, Ibuprofen, Motrin, Naproxen, Naprosyn and Aspirin based products such as Excedrin, Goody's Powder, BC Powder. Stop ANY OVER THE COUNTER supplements until after surgery. Cholecalciferol (VITAMIN D3 ) You may however, continue to take Tylenol if needed for pain up until the day of surgery.  **Follow guidelines for insulin and diabetes medications.** metFORMIN (GLUCOPHAGE) hold for 2 days prior to surgery   Continue taking all of your other prescription medications up until the day of surgery.  ON THE DAY OF SURGERY ONLY TAKE THESE MEDICATIONS WITH SIPS OF WATER:  amoxicillin (AMOXIL)  DULoxetine (CYMBALTA)  gabapentin (NEURONTIN)   Use inhalers on the day of surgery and bring to the hospital. albuterol (VENTOLIN HFA)   No Alcohol for 24 hours before or after surgery.  No Smoking including e-cigarettes for 24 hours before surgery.  No chewable tobacco products for at least 6 hours before surgery.  No nicotine patches on the day of surgery.  Do not use any "recreational" drugs for at least a week (preferably 2 weeks) before your surgery.  Please be advised that the combination of cocaine and anesthesia may have negative outcomes, up  to and including death. If you test positive for cocaine, your surgery will be cancelled.  On the morning of surgery brush your teeth with toothpaste and water, you may rinse your mouth with mouthwash if you wish. Do not swallow any toothpaste or mouthwash.  Do not wear jewelry, make-up, hairpins, clips or nail polish.  For welded (permanent) jewelry: bracelets, anklets, waist bands, etc.  Please have this removed prior to surgery.  If it is not removed, there is a chance that hospital personnel will need to cut it off on the day of surgery.  Do not wear lotions, powders, or perfumes.   Do not shave body hair from the neck down 48 hours before surgery.  Contact lenses, hearing aids and dentures may not be worn into surgery.  Do not bring valuables to the hospital. [facility] is not responsible for any missing/lost belongings or valuables.   Notify your doctor if there is any change in your medical condition (cold, fever, infection).  Wear comfortable clothing (specific to your surgery type) to the hospital.  After surgery, you can help prevent lung complications by doing breathing exercises.  Take deep breaths and cough every 1-2 hours.   If you are being discharged the day of surgery, you will not be allowed to drive home. You will need a responsible individual to drive you home and stay with you for 24 hours after surgery.   If you are taking public transportation, you will need to have a responsible individual with you.  Please call the Pre-admissions Testing Dept. at [phone] if you have any questions about these instructions.  Surgery Visitation Policy:  Patients having surgery or a procedure may have two visitors.  Children under the age of 85 must have an adult with them who is not the patient.  Temporary Visitor Restrictions Due to increasing cases of flu, RSV and COVID-19: Children ages 48 and under will not be able to visit patients in [facility] hospitals  under most circumstances.

## 2023-09-10 ENCOUNTER — Inpatient Hospital Stay
Admission: RE | Admit: 2023-09-10 | Discharge: 2023-09-10 | Disposition: A | Discharge status: Home / Self Care | Payer: Medicare Other | Source: Ambulatory Visit

## 2023-09-10 NOTE — Pre-Procedure Instructions (Signed)
Multiple attempts to contact patient for Pre-Operative interview with no success.

## 2023-09-11 LAB — URINE CULTURE: Culture: >=100000 — AB

## 2023-09-12 ENCOUNTER — Telehealth (HOSPITAL_BASED_OUTPATIENT_CLINIC_OR_DEPARTMENT_OTHER): Payer: Self-pay

## 2023-09-12 MED ORDER — CIPROFLOXACIN IN D5W 400 MG/200ML IV SOLN
400.0000 mg | INTRAVENOUS | Status: AC
  Administered 2023-09-13: 400 mg via INTRAVENOUS

## 2023-09-12 NOTE — Telephone Encounter (Signed)
Post ED Visit - Positive Culture Follow-up  Culture report reviewed by antimicrobial stewardship pharmacist: [facility] [x]  Cedric Fishman, [place].D. []  Celedonio Miyamoto, Pharm.D., BCPS AQ-ID []  Garvin Fila, Pharm.D., BCPS []  Georgina Pillion, Pharm.D., BCPS []  [place], [place].D., BCPS, AAHIVP []  Estella Husk, Pharm.D., BCPS, AAHIVP []  Lysle Pearl, PharmD, BCPS []  Phillips Climes, PharmD, BCPS []  Agapito Games, PharmD, BCPS []  Verlan Friends, PharmD []  Mervyn Gay, PharmD, BCPS []  Vinnie Level, PharmD  [facility] []  Len Childs, PharmD []  Greer Pickerel, PharmD []  Adalberto Cole, PharmD []  Perlie Gold, Rph []  Lonell Face) Jean Rosenthal, PharmD []  Earl Many, PharmD []  Junita Push, PharmD []  Dorna Leitz, PharmD []  Terrilee Files, PharmD []  Lynann Beaver, PharmD []  Keturah Barre, PharmD []  Loralee Pacas, PharmD []  Bernadene Person, PharmD   Positive urine culture Treated with Amoxicillin, organism sensitive to the same and no further patient follow-up is required at this time.  Sandria Senter 09/12/2023, 11:24 AM

## 2023-09-12 NOTE — Progress Notes (Signed)
ED Antimicrobial Stewardship Positive Culture Follow Up   Ronald Ward is an 78 y.o. male who presented to [facility] on 09/08/2023 with a chief complaint of  Chief Complaint  Patient presents with   Penis Pain    Recent Results (from the past 720 hours)  Urine Culture     Status: Abnormal   Collection Time: 09/03/23  4:08 PM   Specimen: Urine, Random  Result Value Ref Range Status   Specimen Description   Final    URINE, RANDOM Performed at [facility], [address], [place], [place] [address]    Special Requests   Final    NONE Performed at [facility], [address] [place], [place] [address]    Culture >=100,000 COLONIES/mL ENTEROCOCCUS FAECALIS (A)  Final   Report Status 09/05/2023 FINAL  Final   Organism ID, Bacteria ENTEROCOCCUS FAECALIS (A)  Final      Susceptibility   Enterococcus faecalis - MIC*    AMPICILLIN <=2 SENSITIVE Sensitive     NITROFURANTOIN <=16 SENSITIVE Sensitive     VANCOMYCIN 1 SENSITIVE Sensitive     * >=100,000 COLONIES/mL ENTEROCOCCUS FAECALIS  Urine Culture     Status: Abnormal   Collection Time: 09/08/23  9:21 PM   Specimen: Urine, Clean Catch  Result Value Ref Range Status   Specimen Description   Final    URINE, CLEAN CATCH Performed at [facility], [address], [place], [place] [address]    Special Requests   Final    NONE Performed at [facility], [address], [place], [place] [address]    Culture >=100,000 COLONIES/mL ENTEROCOCCUS FAECALIS (A)  Final   Report Status 09/11/2023 FINAL  Final   Organism ID, Bacteria ENTEROCOCCUS FAECALIS (A)  Final      Susceptibility   Enterococcus faecalis - MIC*    AMPICILLIN <=2 SENSITIVE Sensitive     NITROFURANTOIN <=16 SENSITIVE Sensitive     VANCOMYCIN 1 SENSITIVE Sensitive     * >=100,000 COLONIES/mL ENTEROCOCCUS FAECALIS    Patient with urinary retention due to BPH presented to ED with pain due to pulling on foley. This was replaced and symptoms  resolved. UA not concerning for infection. Patient was receiving amoxicillin beginning 1/23 for Enterococcus faecalis culture from 1/21. Culture again was positive as above on 1/26 for same organism. Patient will continue to finish his amoxicillin course.   New antibiotic prescription: n/a   ED Provider: Arthor Captain, PA-C    Cedric Fishman 09/12/2023, 8:05 AM Clinical Pharmacist Monday - Friday phone -  [phone] Saturday - Sunday phone - [phone]

## 2023-09-13 ENCOUNTER — Encounter
Admission: RE | Disposition: A | Discharge status: Home / Self Care | Payer: Self-pay | Source: Ambulatory Visit | Attending: Urology

## 2023-09-13 ENCOUNTER — Ambulatory Visit: Payer: Medicare Other | Admitting: Urgent Care

## 2023-09-13 ENCOUNTER — Ambulatory Visit
Admission: RE | Admit: 2023-09-13 | Discharge: 2023-09-13 | Disposition: A | Discharge status: Home / Self Care | Payer: Medicare Other | Source: Ambulatory Visit | Attending: Urology | Admitting: Urology

## 2023-09-13 ENCOUNTER — Other Ambulatory Visit: Payer: Self-pay

## 2023-09-13 ENCOUNTER — Encounter: Payer: Self-pay | Admitting: Urology

## 2023-09-13 ENCOUNTER — Ambulatory Visit: Payer: Medicare Other | Admitting: General Practice

## 2023-09-13 DIAGNOSIS — R338 Other retention of urine: Secondary | ICD-10-CM | POA: Diagnosis not present

## 2023-09-13 DIAGNOSIS — Z833 Family history of diabetes mellitus: Secondary | ICD-10-CM | POA: Diagnosis not present

## 2023-09-13 DIAGNOSIS — N3080 Other cystitis without hematuria: Secondary | ICD-10-CM | POA: Insufficient documentation

## 2023-09-13 DIAGNOSIS — I493 Ventricular premature depolarization: Secondary | ICD-10-CM | POA: Insufficient documentation

## 2023-09-13 DIAGNOSIS — E119 Type 2 diabetes mellitus without complications: Secondary | ICD-10-CM | POA: Diagnosis not present

## 2023-09-13 DIAGNOSIS — I10 Essential (primary) hypertension: Secondary | ICD-10-CM | POA: Insufficient documentation

## 2023-09-13 DIAGNOSIS — J449 Chronic obstructive pulmonary disease, unspecified: Secondary | ICD-10-CM | POA: Diagnosis not present

## 2023-09-13 DIAGNOSIS — N138 Other obstructive and reflux uropathy: Secondary | ICD-10-CM | POA: Insufficient documentation

## 2023-09-13 DIAGNOSIS — F17210 Nicotine dependence, cigarettes, uncomplicated: Secondary | ICD-10-CM | POA: Insufficient documentation

## 2023-09-13 DIAGNOSIS — Z87440 Personal history of urinary (tract) infections: Secondary | ICD-10-CM | POA: Insufficient documentation

## 2023-09-13 DIAGNOSIS — N401 Enlarged prostate with lower urinary tract symptoms: Secondary | ICD-10-CM | POA: Insufficient documentation

## 2023-09-13 HISTORY — PX: HOLEP-LASER ENUCLEATION OF THE PROSTATE WITH MORCELLATION: SHX6641

## 2023-09-13 LAB — GLUCOSE, CAPILLARY
Glucose-Capillary: 125 mg/dL — ABNORMAL HIGH (ref 70–99)
Glucose-Capillary: 119 mg/dL — ABNORMAL HIGH (ref 70–99)

## 2023-09-13 SURGERY — ENUCLEATION, PROSTATE, USING LASER, WITH MORCELLATION
Anesthesia: General

## 2023-09-13 MED ORDER — FENTANYL CITRATE (PF) 100 MCG/2ML IJ SOLN
INTRAMUSCULAR | Status: AC
  Filled 2023-09-13: qty 2

## 2023-09-13 MED ORDER — CHLORHEXIDINE GLUCONATE 0.12 % MT SOLN
15.0000 mL | Freq: Once | OROMUCOSAL | Status: AC
  Administered 2023-09-13: 15 mL via OROMUCOSAL

## 2023-09-13 MED ORDER — PROPOFOL 10 MG/ML IV BOLUS
INTRAVENOUS | Status: AC
  Filled 2023-09-13: qty 20

## 2023-09-13 MED ORDER — PHENYLEPHRINE 80 MCG/ML (10ML) SYRINGE FOR IV PUSH (FOR BLOOD PRESSURE SUPPORT)
PREFILLED_SYRINGE | INTRAVENOUS | Status: DC | PRN
  Administered 2023-09-13 (×5): 80 ug via INTRAVENOUS

## 2023-09-13 MED ORDER — LIDOCAINE HCL (PF) 2 % IJ SOLN
INTRAMUSCULAR | Status: AC
  Filled 2023-09-13: qty 5

## 2023-09-13 MED ORDER — TRAMADOL HCL 50 MG PO TABS: 25.0000 mg | ORAL_TABLET | Freq: Four times a day (QID) | ORAL | 0 refills | Status: AC | PRN

## 2023-09-13 MED ORDER — DEXAMETHASONE SODIUM PHOSPHATE 10 MG/ML IJ SOLN
INTRAMUSCULAR | Status: DC | PRN
  Administered 2023-09-13: 5 mg via INTRAVENOUS

## 2023-09-13 MED ORDER — FENTANYL CITRATE (PF) 100 MCG/2ML IJ SOLN: 25.0000 ug | INTRAMUSCULAR | Status: DC | PRN

## 2023-09-13 MED ORDER — CHLORHEXIDINE GLUCONATE 0.12 % MT SOLN
OROMUCOSAL | Status: AC
  Filled 2023-09-13: qty 15

## 2023-09-13 MED ORDER — OXYCODONE HCL 5 MG PO TABS: 5.0000 mg | ORAL_TABLET | Freq: Once | ORAL | Status: DC | PRN

## 2023-09-13 MED ORDER — SODIUM CHLORIDE 0.9 % IR SOLN
Status: DC | PRN
  Administered 2023-09-13: 6000 mL
  Administered 2023-09-13: 3000 mL
  Administered 2023-09-13: 6000 mL
  Administered 2023-09-13: 3000 mL
  Administered 2023-09-13: 12000 mL

## 2023-09-13 MED ORDER — ROCURONIUM BROMIDE 100 MG/10ML IV SOLN
INTRAVENOUS | Status: DC | PRN
  Administered 2023-09-13: 50 mg via INTRAVENOUS

## 2023-09-13 MED ORDER — ORAL CARE MOUTH RINSE: 15.0000 mL | Freq: Once | OROMUCOSAL | Status: AC

## 2023-09-13 MED ORDER — FENTANYL CITRATE (PF) 100 MCG/2ML IJ SOLN
INTRAMUSCULAR | Status: DC | PRN
  Administered 2023-09-13 (×3): 50 ug via INTRAVENOUS

## 2023-09-13 MED ORDER — DEXAMETHASONE SODIUM PHOSPHATE 10 MG/ML IJ SOLN
INTRAMUSCULAR | Status: AC
  Filled 2023-09-13: qty 1

## 2023-09-13 MED ORDER — EPHEDRINE SULFATE-NACL 50-0.9 MG/10ML-% IV SOSY
PREFILLED_SYRINGE | INTRAVENOUS | Status: DC | PRN
  Administered 2023-09-13: 5 mg via INTRAVENOUS

## 2023-09-13 MED ORDER — ONDANSETRON HCL 4 MG/2ML IJ SOLN
INTRAMUSCULAR | Status: DC | PRN
  Administered 2023-09-13: 4 mg via INTRAVENOUS

## 2023-09-13 MED ORDER — LIDOCAINE HCL (CARDIAC) PF 100 MG/5ML IV SOSY
PREFILLED_SYRINGE | INTRAVENOUS | Status: DC | PRN
  Administered 2023-09-13: 100 mg via INTRAVENOUS

## 2023-09-13 MED ORDER — OXYCODONE HCL 5 MG/5ML PO SOLN: 5.0000 mg | Freq: Once | ORAL | Status: DC | PRN

## 2023-09-13 MED ORDER — CIPROFLOXACIN IN D5W 400 MG/200ML IV SOLN
INTRAVENOUS | Status: AC
  Filled 2023-09-13: qty 200

## 2023-09-13 MED ORDER — PROPOFOL 10 MG/ML IV BOLUS
INTRAVENOUS | Status: DC | PRN
  Administered 2023-09-13: 120 mg via INTRAVENOUS

## 2023-09-13 MED ORDER — ONDANSETRON HCL 4 MG/2ML IJ SOLN
INTRAMUSCULAR | Status: AC
  Filled 2023-09-13: qty 2

## 2023-09-13 MED ORDER — SUGAMMADEX SODIUM 200 MG/2ML IV SOLN
INTRAVENOUS | Status: DC | PRN
  Administered 2023-09-13: 150 mg via INTRAVENOUS

## 2023-09-13 MED ORDER — SODIUM CHLORIDE 0.9 % IV SOLN: INTRAVENOUS | Status: DC

## 2023-09-13 SURGICAL SUPPLY — 29 items
ADAPTER IRRIG TUBE 2 SPIKE SOL (ADAPTER) ×2 IMPLANT
BAG URINE DRAIN 2000ML AR STRL (UROLOGICAL SUPPLIES) ×1 IMPLANT
BAG URO DRAIN 4000ML (MISCELLANEOUS) ×1 IMPLANT
CATH URETL OPEN END 4X70 (CATHETERS) ×1 IMPLANT
CATH URTH STD 24FR FL 3W 2 (CATHETERS) ×1 IMPLANT
CONTAINER COLLECT MORCELLATR (MISCELLANEOUS) ×1 IMPLANT
FIBER LASER MOSES 550 DFL (Laser) ×1 IMPLANT
FILTER OVERFLOW MORCELLATOR (FILTER) ×1 IMPLANT
GLOVE BIOGEL PI IND STRL 7.5 (GLOVE) ×1 IMPLANT
GOWN STRL REUS W/ TWL LRG LVL3 (GOWN DISPOSABLE) ×1 IMPLANT
GOWN STRL REUS W/ TWL XL LVL3 (GOWN DISPOSABLE) ×1 IMPLANT
HOLDER FOLEY CATH W/STRAP (MISCELLANEOUS) ×1 IMPLANT
IV NS IRRIG 3000ML ARTHROMATIC (IV SOLUTION) ×5 IMPLANT
KIT TURNOVER CYSTO (KITS) ×1 IMPLANT
LOOP CUT BIPOLAR 24F LRG (ELECTROSURGICAL) IMPLANT
MBRN O SEALING YLW 17 FOR INST (MISCELLANEOUS) ×1 IMPLANT
MEMBRANE SLNG YLW 17 FOR INST (MISCELLANEOUS) ×1 IMPLANT
MORCELLATOR COLLECT CONTAINER (MISCELLANEOUS) ×1 IMPLANT
MORCELLATOR OVERFLOW FILTER (FILTER) ×1 IMPLANT
MORCELLATOR ROTATION 4.75 335 (MISCELLANEOUS) ×1 IMPLANT
PACK CYSTO AR (MISCELLANEOUS) ×1 IMPLANT
SET CYSTO W/LG BORE CLAMP LF (SET/KITS/TRAYS/PACK) ×1 IMPLANT
SET IRRIG Y TYPE TUR BLADDER L (SET/KITS/TRAYS/PACK) ×1 IMPLANT
SLEEVE PROTECTION STRL DISP (MISCELLANEOUS) ×2 IMPLANT
SURGILUBE 2OZ TUBE FLIPTOP (MISCELLANEOUS) ×1 IMPLANT
SYR TOOMEY IRRIG 70ML (MISCELLANEOUS) ×1 IMPLANT
SYRINGE TOOMEY IRRIG 70ML (MISCELLANEOUS) ×1 IMPLANT
TUBE PUMP MORCELLATOR PIRANHA (TUBING) ×1 IMPLANT
WATER STERILE IRR 1000ML POUR (IV SOLUTION) ×1 IMPLANT

## 2023-09-13 NOTE — Interval H&P Note (Signed)
UROLOGY H&P UPDATE  Agree with prior H&P dated 09/03/23.  78 year old male previously followed by Dr. Retta Diones, BPH with prostate measuring 180 g, negative prostate biopsy, bladder managed with Foley catheter since October 2024.  Cardiac: RRR Lungs: CTA bilaterally  Laterality: N/A Procedure: HOLEP  Urine: Culture with Enterococcus, treated with culture appropriate amoxicillin  We discussed the risks and benefits of HoLEP at length.  The procedure requires general anesthesia and takes 1 to 2 hours, and a holmium laser is used to enucleate the prostate and push this tissue into the bladder.  A morcellator is then used to remove this tissue, which is sent for pathology.  The vast majority(>95%) of patients are able to discharge the same day with a catheter in place for 2 to 3 days, and will follow-up in clinic for a voiding trial.  We specifically discussed the risks of bleeding, infection, retrograde ejaculation, temporary urgency and urge incontinence, very low risk of long-term incontinence, urethral stricture/bladder neck contracture, pathologic evaluation of prostate tissue and possible detection of prostate cancer or other malignancy, and possible need for additional procedures.   Sondra Come, MD 09/13/2023

## 2023-09-13 NOTE — Progress Notes (Signed)
Patient arrived to [facility]. VS obtained and HR noted at 49-85 bpm. Patient denies history of A-fib or any other cardiac issues but states he had sharp/shooting pain last night in chest that lasted only a few seconds. No diagnosis noted in history. Patient placed on EKG monitor and rhythm is ventricular bigeminy. Notified Dr. Lorette Ang and an EKG 12-lead was ordered. Ok to proceed with surgery at this time per anesthesia.

## 2023-09-13 NOTE — Transfer of Care (Signed)
Immediate Anesthesia Transfer of Care Note  Patient: Ronald Ward  Procedure(s) Performed: HOLEP-LASER ENUCLEATION OF THE PROSTATE WITH MORCELLATION  Patient Location: PACU  Anesthesia Type:General  Level of Consciousness: drowsy and patient cooperative  Airway & Oxygen Therapy: Patient Spontanous Breathing and Patient connected to face mask oxygen  Post-op Assessment: Report given to RN, Post -op Vital signs reviewed and stable, and Patient moving all extremities X 4  Post vital signs: Reviewed and stable  Last Vitals:  Vitals Value Taken Time  BP 147/69 09/13/23 0932  Temp    Pulse 74 09/13/23 0935  Resp 17 09/13/23 0935  SpO2 100 % 09/13/23 0935  Vitals shown include unfiled device data.  Last Pain:  Vitals:   09/13/23 0630  TempSrc: Temporal  PainSc: 7          Complications: No notable events documented.

## 2023-09-13 NOTE — Anesthesia Procedure Notes (Signed)
Procedure Name: Intubation Date/Time: 09/13/2023 7:34 AM  Performed by: Lanell Matar, CRNAPre-anesthesia Checklist: Patient identified, Emergency Drugs available, Suction available and Patient being monitored Patient Re-evaluated:Patient Re-evaluated prior to induction Oxygen Delivery Method: Circle System Utilized Preoxygenation: Pre-oxygenation with 100% oxygen Induction Type: IV induction Ventilation: Mask ventilation without difficulty Laryngoscope Size: McGrath and 4 Grade View: Grade I Tube type: Oral Tube size: 7.5 mm Number of attempts: 1 Airway Equipment and Method: Stylet and Oral airway Placement Confirmation: ETT inserted through vocal cords under direct vision, positive ETCO2 and breath sounds checked- equal and bilateral Secured at: 21 cm Tube secured with: Tape Dental Injury: Teeth and Oropharynx as per pre-operative assessment

## 2023-09-13 NOTE — Op Note (Signed)
Date of procedure: 09/13/23  Preoperative diagnosis:  BPH with obstruction  Postoperative diagnosis:  Same  Procedure: HoLEP (Holmium Laser Enucleation of the Prostate)  Surgeon: Legrand Rams, MD  Anesthesia: General  Complications: None  Intraoperative findings:  Large prostate with obstructing lateral lobes and median lobe, moderate trabeculations, mild posterior wall catheter cystitis, no suspicious lesions Ureteral orifices and verumontanum intact at conclusion of case  EBL: 10 mL  Specimens: Prostate chips  Enucleation time: 25 minutes  Morcellation time: 50 minutes  Intra-op weight: 131g  Drains: 24 French three-way, 60 cc in balloon  Indication: Ronald Ward is a 78 y.o. patient with prostate that measured 180 g, negative prostate biopsy, and Foley dependent urinary retention since October 2024.  He has been managed by Dr. Retta Diones and was referred for HOLEP.  After reviewing the management options for treatment, they elected to proceed with the above surgical procedure(s). We have discussed the potential benefits and risks of the procedure, side effects of the proposed treatment, the likelihood of the patient achieving the goals of the procedure, and any potential problems that might occur during the procedure or recuperation.  We specifically discussed the risks of bleeding, infection, hematuria and clot retention, need for additional procedures, possible overnight hospital stay, temporary urgency and incontinence, rare long-term incontinence, and retrograde ejaculation.  Informed consent has been obtained.   Description of procedure:  The patient was taken to the operating room and general anesthesia was induced.  The patient was placed in the dorsal lithotomy position, prepped and draped in the usual sterile fashion, and preoperative antibiotics were administered.  SCDs were placed for DVT prophylaxis.  A preoperative time-out was performed.   Sissy Hoff sounds  were used to gently dilated the urethra up to 109F. The 29 French continuous flow resectoscope was inserted into the urethra using the visual obturator  The prostate was large with obstructing lateral lobes, elevated bladder neck, and a small median lobe. The bladder was thoroughly inspected and notable for moderate trabeculations and mild posterior wall catheter cystitis but no suspicious lesions.  The ureteral orifices were located in orthotopic position.    The laser was set to 2 J and 60 Hz and early apical release was performed by making a circumferential mucosal incision proximal to the sphincter.  A lambda incision was then made proximal to the verumontanum.  The prostate was enucleated en bloc circumferentially into the bladder.  The capsule was examined and laser was used for meticulous hemostasis.    The 96 French resectoscope was then switched out for the 26 French nephroscope and prostate tissue was morcellated(Piranha/) and the tissue sent to pathology.  Morcellation was quite challenging secondary to large BPH nodules.  Ultimately there was a 2 cm spherical nodule that was unable to be morcellated and I inserted the bipolar resectoscope with large loop and this was resected down and irrigated free.  Hemostasis was achieved around the bladder neck and was excellent.  A 24 French three-way catheter was inserted easily with the aid of a catheter guide, and 60 cc were placed in the balloon.  Urine was light pink.  The catheter irrigated easily with a Toomey syringe.  CBI was initiated.   The patient tolerated the procedure well without any immediate complications and was extubated and transferred to the recovery room in stable condition.  Urine was clear on fast CBI.  Disposition: Stable to PACU  Plan: Wean CBI in PACU, anticipate discharge home today with Foley removal in  clinic in 2-3 days  Legrand Rams, MD 09/13/2023

## 2023-09-13 NOTE — Anesthesia Postprocedure Evaluation (Signed)
Anesthesia Post Note  Patient: Ronald Ward  Procedure(s) Performed: HOLEP-LASER ENUCLEATION OF THE PROSTATE WITH MORCELLATION  Patient location during evaluation: PACU Anesthesia Type: General Level of consciousness: awake and alert Pain management: pain level controlled Vital Signs Assessment: post-procedure vital signs reviewed and stable Respiratory status: spontaneous breathing, nonlabored ventilation, respiratory function stable and patient connected to nasal cannula oxygen Cardiovascular status: blood pressure returned to baseline and stable Postop Assessment: no apparent nausea or vomiting Anesthetic complications: no  No notable events documented.   Last Vitals:  Vitals:   09/13/23 1015 09/13/23 1037  BP: (!) 142/81 (!) 163/65  Pulse: 80 72  Resp: 14 16  Temp: 36.7 C   SpO2: 95% 96%    Last Pain:  Vitals:   09/13/23 1037  TempSrc:   PainSc: 4                  Stephanie Coup

## 2023-09-13 NOTE — Anesthesia Preprocedure Evaluation (Addendum)
Anesthesia Evaluation  Patient identified by MRN, date of birth, ID band Patient awake    Reviewed: Allergy & Precautions, NPO status , Patient's Chart, lab work & pertinent test results  Airway Mallampati: III  TM Distance: >3 FB Neck ROM: full    Dental  (+) Chipped, Dental Advidsory Given   Pulmonary COPD, Current Smoker and Patient abstained from smoking.   Pulmonary exam normal        Cardiovascular hypertension,  Rhythm:irregular Rate:Normal     Neuro/Psych negative neurological ROS  negative psych ROS   GI/Hepatic negative GI ROS, Neg liver ROS,,,  Endo/Other  diabetes    Renal/GU      Musculoskeletal   Abdominal   Peds  Hematology negative hematology ROS (+)   Anesthesia Other Findings Patient with PMH of PVCs on EKG. This morning patient had PVCs in a bigeminy pattern. Patient is asymptomatic and denies any shortness of breath, chest pain, or flutters in his chest. 12 lead EKG obtained that showed PVCs that looked like a similar pattern to EKGs the patient has had in the past. Patient able to obtain >4 METS.  Discussed an increased risk of arrhythmias, heart attack and death with the patient. Patient reiterates that he feels fine and would like to proceed.     Past Medical History: No date: Arthritis No date: Asthma No date: Cervical spondylosis without myelopathy No date: COPD (chronic obstructive pulmonary disease) (HCC) No date: Diabetes mellitus without complication (HCC) No date: Disorder of rotator cuff of both shoulders No date: Hypertension No date: Prostate disease  Past Surgical History: No date: ANKLE SURGERY No date: Arm surgery 11/22/2016: COLONOSCOPY; N/A     Comment:  Procedure: COLONOSCOPY;  Surgeon: Malissa Hippo, MD;                Location[facility];  Service: Endoscopy;                Laterality: N/A;  930 No date: PENILE ADHESIONS LYSIS  BMI    Body Mass Index: 22.72  kg/m      Reproductive/Obstetrics negative OB ROS                             Anesthesia Physical Anesthesia Plan  ASA: 3  Anesthesia Plan: General ETT   Post-op Pain Management:    Induction: Intravenous  PONV Risk Score and Plan: Ondansetron and Dexamethasone  Airway Management Planned: Oral ETT  Additional Equipment:   Intra-op Plan:   Post-operative Plan: Extubation in OR  Informed Consent: I have reviewed the patients History and Physical, chart, labs and discussed the procedure including the risks, benefits and alternatives for the proposed anesthesia with the patient or authorized representative who has indicated his/her understanding and acceptance.     Dental Advisory Given  Plan Discussed with: Anesthesiologist, CRNA and Surgeon  Anesthesia Plan Comments: (Patient consented for risks of anesthesia including but not limited to:  - adverse reactions to medications - damage to eyes, teeth, lips or other oral mucosa - nerve damage due to positioning  - sore throat or hoarseness - Damage to heart, brain, nerves, lungs, other parts of body or loss of life  Patient voiced understanding and assent.)       Anesthesia Quick Evaluation

## 2023-09-14 ENCOUNTER — Encounter: Payer: Self-pay | Admitting: Urology

## 2023-09-16 ENCOUNTER — Other Ambulatory Visit: Payer: Self-pay

## 2023-09-16 ENCOUNTER — Encounter (HOSPITAL_COMMUNITY): Payer: Self-pay

## 2023-09-16 ENCOUNTER — Emergency Department (HOSPITAL_COMMUNITY)
Admission: EM | Admit: 2023-09-16 | Discharge: 2023-09-16 | Disposition: A | Discharge status: Home / Self Care | Payer: Medicare Other | Attending: Emergency Medicine | Admitting: Emergency Medicine

## 2023-09-16 ENCOUNTER — Encounter: Payer: Self-pay | Admitting: Physician Assistant

## 2023-09-16 ENCOUNTER — Telehealth: Payer: Self-pay

## 2023-09-16 DIAGNOSIS — Z466 Encounter for fitting and adjustment of urinary device: Secondary | ICD-10-CM | POA: Insufficient documentation

## 2023-09-16 DIAGNOSIS — E119 Type 2 diabetes mellitus without complications: Secondary | ICD-10-CM | POA: Insufficient documentation

## 2023-09-16 DIAGNOSIS — J45909 Unspecified asthma, uncomplicated: Secondary | ICD-10-CM | POA: Insufficient documentation

## 2023-09-16 DIAGNOSIS — T83091A Other mechanical complication of indwelling urethral catheter, initial encounter: Secondary | ICD-10-CM | POA: Diagnosis not present

## 2023-09-16 DIAGNOSIS — J4489 Other specified chronic obstructive pulmonary disease: Secondary | ICD-10-CM | POA: Diagnosis not present

## 2023-09-16 DIAGNOSIS — I10 Essential (primary) hypertension: Secondary | ICD-10-CM | POA: Diagnosis not present

## 2023-09-16 DIAGNOSIS — Z978 Presence of other specified devices: Secondary | ICD-10-CM

## 2023-09-16 LAB — SURGICAL PATHOLOGY

## 2023-09-16 NOTE — ED Provider Notes (Signed)
[place] EMERGENCY DEPARTMENT AT [facility] Provider Note   CSN: [id number] Arrival date & time: 09/16/23  9528     History  No chief complaint on file.   Ronald Ward is a 78 y.o. male.  Pt is a 78 yo male with pmhx significant for BPH, DM, COPD, HTN, and Asthma.  Pt has had an indwelling foley catheter in place since October.  Pt had a HoLEP (Holmium Laser Enucleation of the Prostate) by Dr. Richardo Hanks on 1/31.  Pt said he was told to come here to get his foley removed today.  In op notes, Dr. Richardo Hanks said to return to clinic in 2-3 days for foley removal.         Home Medications Prior to Admission medications   Medication Sig Start Date End Date Taking? Authorizing Provider  albuterol (PROVENTIL) (2.5 MG/3ML) 0.083% nebulizer solution Take 3 mLs (2.5 mg total) by nebulization every 6 (six) hours as needed for wheezing or shortness of breath. 10/01/22   Smitty Knudsen, PA-C  albuterol (VENTOLIN HFA) 108 (90 Base) MCG/ACT inhaler Inhale 1-2 puffs into the lungs every 6 (six) hours as needed for wheezing or shortness of breath. 12/11/22   Gilmore Laroche, FNP  amoxicillin (AMOXIL) 500 MG tablet Take 1 tablet (500 mg total) by mouth every 12 (twelve) hours. Patient not taking: Reported on 09/13/2023 09/05/23   Sondra Come, MD  Cholecalciferol (VITAMIN D3 ULTRA STRENGTH) 125 MCG (5000 UT) capsule Take 5,000 Units by mouth daily.    [provider]  cyclobenzaprine (FLEXERIL) 5 MG tablet TAKE (1) TABLET BY MOUTH AT BEDTIME AS NEEDED. 06/01/23   Gilmore Laroche, FNP  DULoxetine (CYMBALTA) 60 MG capsule Take 1 capsule (60 mg total) by mouth daily. 02/20/23   Gilmore Laroche, FNP  enalapril (VASOTEC) 2.5 MG tablet Take 2.5 mg by mouth daily as needed (hight blood pressure). 08/29/18   [provider]  EPINEPHrine 0.3 mg/0.3 mL IJ SOAJ injection Inject 0.3 mLs (0.3 mg total) into the muscle once as needed (for allergic reaction). 04/08/16   Samuel Jester, DO   gabapentin (NEURONTIN) 300 MG capsule Take 300 mg by mouth 3 (three) times daily.    [provider]  metFORMIN (GLUCOPHAGE) 500 MG tablet Take 1 tablet (500 mg total) by mouth 2 (two) times daily with a meal. Patient taking differently: Take 500 mg by mouth 2 (two) times daily as needed (high blood sugar). 05/24/22   Gilmore Laroche, FNP  traMADol (ULTRAM) 50 MG tablet Take 0.5-1 tablets (25-50 mg total) by mouth every 6 (six) hours as needed for up to 3 days for severe pain (pain score 7-10). 09/13/23 09/16/23  Sondra Come, MD      Allergies    Bee venom, Fish allergy, and Shellfish allergy    Review of Systems   Review of Systems  Genitourinary:  Positive for dysuria.  All other systems reviewed and are negative.   Physical Exam Updated Vital Signs BP (!) 140/71   Pulse 66   Temp 98.2 F (36.8 C) (Oral)   Resp 17   Ht 5\' 7"  (1.702 m)   Wt 65.8 kg   SpO2 96%   BMI 22.71 kg/m  Physical Exam Vitals and nursing note reviewed.  Constitutional:      Appearance: Normal appearance.  HENT:     Head: Normocephalic and atraumatic.     Right Ear: External ear normal.     Left Ear: External ear normal.  Nose: Nose normal.     Mouth/Throat:     Mouth: Mucous membranes are moist.     Pharynx: Oropharynx is clear.  Eyes:     Conjunctiva/sclera: Conjunctivae normal.     Pupils: Pupils are equal, round, and reactive to light.  Cardiovascular:     Rate and Rhythm: Normal rate and regular rhythm.     Pulses: Normal pulses.     Heart sounds: Normal heart sounds.  Pulmonary:     Effort: Pulmonary effort is normal.     Breath sounds: Normal breath sounds.  Abdominal:     General: Abdomen is flat. Bowel sounds are normal.     Palpations: Abdomen is soft.  Musculoskeletal:        General: Normal range of motion.     Cervical back: Normal range of motion and neck supple.  Skin:    General: Skin is warm.     Capillary Refill: Capillary refill takes less than 2  seconds.  Neurological:     General: No focal deficit present.     Mental Status: He is alert and oriented to person, place, and time.  Psychiatric:        Mood and Affect: Mood normal.        Behavior: Behavior normal.     ED Results / Procedures / Treatments   Labs (all labs ordered are listed, but only abnormal results are displayed) Labs Reviewed - No data to display  EKG None  Radiology No results found.  Procedures Procedures    Medications Ordered in ED Medications - No data to display  ED Course/ Medical Decision Making/ A&P                                 Medical Decision Making  This patient presents to the ED for concern of foley removal, this involves an extensive number of treatment options, and is a complaint that carries with it a high risk of complications and morbidity.  The differential diagnosis includes foley removal, uti   Co morbidities that complicate the patient evaluation  BPH, DM, COPD, HTN, and Asthma   Additional history obtained:  Additional history obtained from epic chart review   Medicines ordered and prescription drug management:   I have reviewed the patients home medicines and have made adjustments as needed  Consultations Obtained:  I requested consultation with the urologist (Dr.McKenzie) ,  and discussed lab and imaging findings as well as pertinent plan - he said not to remove catheter.  It needs to be removed in clinic.   Problem List / ED Course:  Foley catheter in place:  I told pt that he needs to call the clinic and see when his post op appt is so that he can go there to get his foley removed.  Social Determinants of Health:  Lives at home   Dispostion:  After consideration of the diagnostic results and the patients response to treatment, I feel that the patent would benefit from d/c with outpatient f/u.          Final Clinical Impression(s) / ED Diagnoses Final diagnoses:  Foley catheter in  place    Rx / DC Orders ED Discharge Orders     None         Jacalyn Lefevre, MD 09/16/23 [phone]

## 2023-09-16 NOTE — Telephone Encounter (Signed)
[facility] sent a ED follow up note via fax to the [facility] [place] office. Per the ED the patient came to them to have his foley catheter removed following his 1/31 surgery (Holmium Laser Enucleation of the Prostate). The ED made the patient aware that the op notes stated for him to return to the clinic in 2-3 days for foley removal. I have sent a message to the [facility] office for review.

## 2023-09-16 NOTE — ED Triage Notes (Signed)
Pt arrived POV stating urologist stated to take out foley cath today  in ED.

## 2023-09-16 NOTE — Telephone Encounter (Signed)
Pts wife states they are not able to make it to [facility] for a voiding trial. They do not have transportation. She states she had a stroke last week and Ronald Ward does not drive.   She states they can make it to the [place] office but not here to [place].   Pt is a [facility] pt. Referred to [facility] for Holep ( BPH w/obstruction)  S/p Holep 1/31.   Pls advise

## 2023-09-16 NOTE — Discharge Instructions (Signed)
You need to go to the urology office to get your foley removed.

## 2023-09-17 ENCOUNTER — Encounter: Payer: Self-pay | Admitting: Physician Assistant

## 2023-09-17 NOTE — Telephone Encounter (Signed)
Will send to [facility] to follow. She will reach out to [facility] to see if they can remove pts foley.

## 2023-09-19 ENCOUNTER — Ambulatory Visit: Payer: Medicare Other

## 2023-09-19 DIAGNOSIS — R339 Retention of urine, unspecified: Secondary | ICD-10-CM | POA: Diagnosis not present

## 2023-09-19 NOTE — Progress Notes (Addendum)
 Fill and Pull Catheter Removal  Patient is present today for a catheter removal.  Patient was cleaned and prepped in a sterile fashion of sterile water / saline was instilled into the bladder when the patient felt the urge to urinate. 30ml of water  was then drained from the balloon.  A 24FR foley cath was removed from the bladder no complications were noted .  Patient as then given some time to void on their own.  Patient can void  on their own after some time.  Patient tolerated well.  Performed by: Exie DASEN. CMA  Follow up/ Additional notes: As scheduled

## 2023-10-30 ENCOUNTER — Encounter: Payer: Self-pay | Admitting: Urology

## 2023-11-11 NOTE — Progress Notes (Incomplete)
 History of Present Illness: Pt returns for f/u of BPH w/ severe LUTS--s/p HoLEP procedure by Dr Richardo Hanks 1.31.2025. Resected tissue--115 gm. Path all benign.    Past Medical History:  Diagnosis Date   Arthritis    Asthma    Cervical spondylosis without myelopathy    COPD (chronic obstructive pulmonary disease) (HCC)    Diabetes mellitus without complication (HCC)    Disorder of rotator cuff of both shoulders    Hypertension    Prostate disease     Past Surgical History:  Procedure Laterality Date   ANKLE SURGERY     Arm surgery     COLONOSCOPY N/A 11/22/2016   Procedure: COLONOSCOPY;  Surgeon: Malissa Hippo, MD;  Location: [facility];  Service: Endoscopy;  Laterality: N/A;  930   HOLEP-LASER ENUCLEATION OF THE PROSTATE WITH MORCELLATION N/A 09/13/2023   Procedure: HOLEP-LASER ENUCLEATION OF THE PROSTATE WITH MORCELLATION;  Surgeon: Sondra Come, MD;  Location: [facility] ORS;  Service: Urology;  Laterality: N/A;   PENILE ADHESIONS LYSIS      Home Medications:  Allergies as of 11/12/2023       Reactions   Bee Venom Itching, Swelling, Other (See Comments)   Cough   Fish Allergy Itching, Swelling, Other (See Comments)   Cough   Shellfish Allergy Itching, Swelling, Other (See Comments)   Cough        Medication List        Accurate as of November 11, 2023  8:55 PM. If you have any questions, ask your nurse or doctor.          albuterol (2.5 MG/3ML) 0.083% nebulizer solution Commonly known as: PROVENTIL Take 3 mLs (2.5 mg total) by nebulization every 6 (six) hours as needed for wheezing or shortness of breath.   albuterol 108 (90 Base) MCG/ACT inhaler Commonly known as: VENTOLIN HFA Inhale 1-2 puffs into the lungs every 6 (six) hours as needed for wheezing or shortness of breath.   amoxicillin 500 MG tablet Commonly known as: AMOXIL Take 1 tablet (500 mg total) by mouth every 12 (twelve) hours.   cyclobenzaprine 5 MG tablet Commonly known as: FLEXERIL TAKE  (1) TABLET BY MOUTH AT BEDTIME AS NEEDED.   DULoxetine 60 MG capsule Commonly known as: Cymbalta Take 1 capsule (60 mg total) by mouth daily.   enalapril 2.5 MG tablet Commonly known as: VASOTEC Take 2.5 mg by mouth daily as needed (hight blood pressure).   EPINEPHrine 0.3 mg/0.3 mL Soaj injection Commonly known as: EPI-PEN Inject 0.3 mLs (0.3 mg total) into the muscle once as needed (for allergic reaction).   gabapentin 300 MG capsule Commonly known as: NEURONTIN Take 300 mg by mouth 3 (three) times daily.   metFORMIN 500 MG tablet Commonly known as: GLUCOPHAGE Take 1 tablet (500 mg total) by mouth 2 (two) times daily with a meal. What changed:  when to take this reasons to take this   Vitamin D3 Ultra Strength 125 MCG (5000 UT) capsule Generic drug: Cholecalciferol Take 5,000 Units by mouth daily.        Allergies:  Allergies  Allergen Reactions   Bee Venom Itching, Swelling and Other (See Comments)    Cough   Fish Allergy Itching, Swelling and Other (See Comments)    Cough   Shellfish Allergy Itching, Swelling and Other (See Comments)    Cough    Family History  Problem Relation Age of Onset   Heart attack Mother    Diabetes Mother  Diabetes Brother    Cancer Brother     Social History:  reports that he has been smoking cigarettes. He started smoking about 63 years ago. He has a 48.7 pack-year smoking history. He has never used smokeless tobacco. He reports current alcohol use. He reports that he does not use drugs.  ROS: A complete review of systems was performed.  All systems are negative except for pertinent findings as noted.  Physical Exam:  Vital signs in last 24 hours: There were no vitals taken for this visit. Constitutional:  Alert and oriented, No acute distress Cardiovascular: Regular rate  Respiratory: Normal respiratory effort GI: Abdomen is soft, nontender, nondistended, no abdominal masses. No CVAT.  Genitourinary: Normal male  phallus, testes are descended bilaterally and non-tender and without masses, scrotum is normal in appearance without lesions or masses, perineum is normal on inspection. Lymphatic: No lymphadenopathy Neurologic: Grossly intact, no focal deficits Psychiatric: Normal mood and affect  I have reviewed prior pt notes  I have reviewed notes from referring/previous physicians  I have reviewed urinalysis results  I have independently reviewed prior imaging  I have reviewed prior PSA results  I have reviewed prior urine culture   Impression/Assessment:  ***  Plan:  ***

## 2023-11-12 ENCOUNTER — Ambulatory Visit: Payer: Medicare Other | Admitting: Urology

## 2023-11-12 DIAGNOSIS — R972 Elevated prostate specific antigen [PSA]: Secondary | ICD-10-CM

## 2023-11-12 DIAGNOSIS — N138 Other obstructive and reflux uropathy: Secondary | ICD-10-CM

## 2023-11-12 DIAGNOSIS — R339 Retention of urine, unspecified: Secondary | ICD-10-CM

## 2023-12-10 DIAGNOSIS — E1142 Type 2 diabetes mellitus with diabetic polyneuropathy: Secondary | ICD-10-CM | POA: Diagnosis not present

## 2023-12-10 DIAGNOSIS — L84 Corns and callosities: Secondary | ICD-10-CM | POA: Diagnosis not present

## 2023-12-10 DIAGNOSIS — M79675 Pain in left toe(s): Secondary | ICD-10-CM | POA: Diagnosis not present

## 2023-12-10 DIAGNOSIS — B351 Tinea unguium: Secondary | ICD-10-CM | POA: Diagnosis not present

## 2023-12-10 DIAGNOSIS — M79674 Pain in right toe(s): Secondary | ICD-10-CM | POA: Diagnosis not present

## 2024-01-09 ENCOUNTER — Ambulatory Visit: Payer: Medicare Other | Admitting: Urology

## 2024-02-22 ENCOUNTER — Emergency Department (HOSPITAL_COMMUNITY)
Admission: EM | Admit: 2024-02-22 | Discharge: 2024-02-22 | Disposition: A | Discharge status: Home / Self Care | Attending: Emergency Medicine | Admitting: Emergency Medicine

## 2024-02-22 ENCOUNTER — Encounter (HOSPITAL_COMMUNITY): Payer: Self-pay

## 2024-02-22 ENCOUNTER — Other Ambulatory Visit: Payer: Self-pay

## 2024-02-22 DIAGNOSIS — I10 Essential (primary) hypertension: Secondary | ICD-10-CM | POA: Insufficient documentation

## 2024-02-22 DIAGNOSIS — E119 Type 2 diabetes mellitus without complications: Secondary | ICD-10-CM | POA: Diagnosis not present

## 2024-02-22 DIAGNOSIS — Z7984 Long term (current) use of oral hypoglycemic drugs: Secondary | ICD-10-CM | POA: Diagnosis not present

## 2024-02-22 DIAGNOSIS — J449 Chronic obstructive pulmonary disease, unspecified: Secondary | ICD-10-CM | POA: Diagnosis not present

## 2024-02-22 DIAGNOSIS — L02212 Cutaneous abscess of back [any part, except buttock]: Secondary | ICD-10-CM | POA: Insufficient documentation

## 2024-02-22 DIAGNOSIS — L0291 Cutaneous abscess, unspecified: Secondary | ICD-10-CM

## 2024-02-22 LAB — CBG MONITORING, ED: Glucose-Capillary: 114 mg/dL — ABNORMAL HIGH (ref 70–99)

## 2024-02-22 MED ORDER — DOXYCYCLINE HYCLATE 100 MG PO TABS
100.0000 mg | ORAL_TABLET | Freq: Once | ORAL | Status: AC
  Administered 2024-02-22: 100 mg via ORAL
  Filled 2024-02-22: qty 1

## 2024-02-22 MED ORDER — ACETAMINOPHEN 500 MG PO TABS
1000.0000 mg | ORAL_TABLET | Freq: Once | ORAL | Status: AC
  Administered 2024-02-22: 1000 mg via ORAL
  Filled 2024-02-22: qty 2

## 2024-02-22 MED ORDER — DOXYCYCLINE HYCLATE 100 MG PO CAPS: 100.0000 mg | ORAL_CAPSULE | Freq: Two times a day (BID) | ORAL | 0 refills | Status: AC

## 2024-02-22 MED ORDER — LIDOCAINE HCL (PF) 1 % IJ SOLN
30.0000 mL | Freq: Once | INTRAMUSCULAR | Status: AC
  Administered 2024-02-22: 30 mL
  Filled 2024-02-22: qty 30

## 2024-02-22 NOTE — ED Provider Notes (Signed)
 [facility] AT [facility] Provider Note   CSN: [id number] Arrival date & time: 02/22/24  1406     Patient presents with: Abscess   Ronald Ward is a 78 y.o. male.  He has history of arthritis, cervical spondylosis, hypertension, diabetes, COPD.   He presents the ER complaining of abscess to the right posterior shoulder.  He states he woke up this morning and noticed pain, he was able to look in the mirror and saw a large red bump, has had an abscess in the past and this is similar.  He denies prior injury to the area.  He denies fever or chills.  He has not had any drainage at home.    Abscess      Prior to Admission medications   Medication Sig Start Date End Date Taking? Authorizing Provider  albuterol  (PROVENTIL ) (2.5 MG/3ML) 0.083% nebulizer solution Take 3 mLs (2.5 mg total) by nebulization every 6 (six) hours as needed for wheezing or shortness of breath. 10/01/22   Zelaya, Oscar A, PA-C  albuterol  (VENTOLIN  HFA) 108 (90 Base) MCG/ACT inhaler Inhale 1-2 puffs into the lungs every 6 (six) hours as needed for wheezing or shortness of breath. 12/11/22   Zarwolo, Gloria, FNP  amoxicillin  (AMOXIL ) 500 MG tablet Take 1 tablet (500 mg total) by mouth every 12 (twelve) hours. Patient not taking: Reported on 09/13/2023 09/05/23   Francisca Redell BROCKS, MD  Cholecalciferol (VITAMIN D3 ULTRA STRENGTH) 125 MCG (5000 UT) capsule Take 5,000 Units by mouth daily.    [provider]  cyclobenzaprine  (FLEXERIL ) 5 MG tablet TAKE (1) TABLET BY MOUTH AT BEDTIME AS NEEDED. 06/01/23   Zarwolo, Gloria, FNP  DULoxetine  (CYMBALTA ) 60 MG capsule Take 1 capsule (60 mg total) by mouth daily. 02/20/23   Zarwolo, Gloria, FNP  enalapril (VASOTEC) 2.5 MG tablet Take 2.5 mg by mouth daily as needed (hight blood pressure). 08/29/18   [provider]  EPINEPHrine  0.3 mg/0.3 mL IJ SOAJ injection Inject 0.3 mLs (0.3 mg total) into the muscle once as needed (for allergic  reaction). 04/08/16   Joyice Sauer, DO  gabapentin  (NEURONTIN ) 300 MG capsule Take 300 mg by mouth 3 (three) times daily.    [provider]  metFORMIN  (GLUCOPHAGE ) 500 MG tablet Take 1 tablet (500 mg total) by mouth 2 (two) times daily with a meal. Patient taking differently: Take 500 mg by mouth 2 (two) times daily as needed (high blood sugar). 05/24/22   Zarwolo, Gloria, FNP    Allergies: Bee venom, Fish allergy, and Shellfish allergy    Review of Systems  Updated Vital Signs BP (!) 149/90 (BP Location: Right Arm)   Pulse (!) 54   Temp 98.7 F (37.1 C) (Oral)   Resp 20   Ht 5' 7 (1.702 m)   Wt 65.8 kg   SpO2 92%   BMI 22.71 kg/m   Physical Exam Vitals and nursing note reviewed.  Constitutional:      General: He is not in acute distress.    Appearance: He is well-developed.  HENT:     Head: Normocephalic and atraumatic.     Mouth/Throat:     Mouth: Mucous membranes are moist.  Eyes:     Conjunctiva/sclera: Conjunctivae normal.  Cardiovascular:     Rate and Rhythm: Normal rate and regular rhythm.     Heart sounds: No murmur heard. Pulmonary:     Effort: Pulmonary effort is normal. No respiratory distress.     Breath sounds:  Normal breath sounds.  Abdominal:     Palpations: Abdomen is soft.     Tenderness: There is no abdominal tenderness.  Musculoskeletal:        General: No swelling.     Cervical back: Neck supple.  Skin:    General: Skin is warm and dry.     Capillary Refill: Capillary refill takes less than 2 seconds.     Comments: Approximately 2 cm diameter fluctuant mass right posterior shoulder with surrounding induration and mild overlying cellulitis.  There is no crepitus.  Normal May range of motion of right shoulder but with pain to the area of abscess with  movement of shoulder or back.  Neurological:     General: No focal deficit present.     Mental Status: He is alert and oriented to person, place, and time.  Psychiatric:        Mood  and Affect: Mood normal.     (all labs ordered are listed, but only abnormal results are displayed) Labs Reviewed  CBG MONITORING, ED - Abnormal; Notable for the following components:      Result Value   Glucose-Capillary 114 (*)    All other components within normal limits    EKG: None  Radiology: No results found.   .Incision and Drainage  Date/Time: 02/22/2024 7:11 PM  Performed by: Ronald Sherran LABOR, PA-C Authorized by: Ronald Sherran LABOR, PA-C   Consent:    Consent obtained:  Verbal   Consent given by:  Patient   Risks discussed:  Damage to other organs, bleeding, incomplete drainage and pain Universal protocol:    Patient identity confirmed:  Verbally with patient Location:    Type:  Abscess   Location:  Trunk   Trunk location:  Back Pre-procedure details:    Skin preparation:  Povidone-iodine Sedation:    Sedation type:  None Anesthesia:    Anesthesia method:  Local infiltration   Local anesthetic:  Lidocaine  1% w/o epi Procedure type:    Complexity:  Simple Procedure details:    Incision types:  Single straight   Incision depth:  Subcutaneous   Wound management:  Probed and deloculated and irrigated with saline   Drainage:  Purulent   Wound treatment:  Wound left open   Packing materials:  None Post-procedure details:    Procedure completion:  Tolerated Comments:     Performed by PA Student Sera Stotelmyre with my supervision    Medications Ordered in the ED - No data to display                                  Medical Decision Making Differential diagnosis includes but not limited to abscess, epidermal inclusion cyst, lipoma, cellulitis, hematoma, other  ED course: Patient has fluctuant mass to right posterior shoulder that he noticed this morning, this is fluctuant with overlying erythema consistent with a cutaneous abscess.  Patient has had I&D in the past and is agreeable with this.  Will start on doxycycline  for antibiotics.  Amount and/or  Complexity of Data Reviewed External Data Reviewed: notes. Labs:     Details: Blood glucose in ED today is 114  Risk OTC drugs. Prescription drug management.        Final diagnoses:  None    ED Discharge Orders     None          Ronald Ward 02/22/24 1913    Elnor Savant  A, DO 02/23/24 2113

## 2024-02-22 NOTE — Discharge Instructions (Addendum)
 It was a pleasure taking care of you today.  You are seen in the ER for evaluation of an abscess on your back.  We incised and drained this today and starting on antibiotics.  Keep the area clean and dry, change the bandage as needed.  Take the antibiotics and follow-up for a wound check in 2 days with your primary care doctor, come back to the ER if you have increased pain or redness, fever or chills or any other worrisome changes.

## 2024-02-22 NOTE — ED Triage Notes (Addendum)
 Reports two nights ago felt a pain on his right shoulder and thought it was the way he was laying.  Patient reports last night the knot got big red and hard.  Patient has what appears to be an abscess to his right posterior upper back.  Patient reports he has not taken anything for the pain such as tylenol  or motrin

## 2024-02-28 ENCOUNTER — Encounter: Payer: Self-pay | Admitting: Family Medicine

## 2024-02-28 ENCOUNTER — Ambulatory Visit: Admitting: Family Medicine

## 2024-02-28 VITALS — BP 135/84 | HR 73 | Ht 67.0 in | Wt 133.0 lb

## 2024-02-28 DIAGNOSIS — Z4800 Encounter for change or removal of nonsurgical wound dressing: Secondary | ICD-10-CM

## 2024-02-28 MED ORDER — DOXYCYCLINE HYCLATE 100 MG PO TABS: 100.0000 mg | ORAL_TABLET | Freq: Two times a day (BID) | ORAL | 0 refills | Status: AC

## 2024-02-28 NOTE — Assessment & Plan Note (Signed)
 Status Post (S/P) Incision and Drainage (I&D) of Right Posterior Shoulder Abscess The wound site showed slight yellow drainage and erythema along the edges. The area was cleansed with alcohol, and a Telfa dressing with gauze was applied. The patient tolerated the procedure well without any signs of discomfort.

## 2024-02-28 NOTE — Patient Instructions (Signed)

## 2024-02-28 NOTE — Progress Notes (Signed)
   Established Patient Office Visit   Subjective  Patient ID: Ronald [facility], male    DOB: [birth year]  Age: 78 y.o. MRN: [id number]  Chief Complaint  Patient presents with   Follow-up    ER follow up, needs dressing change on abscess     Ronald [facility]  has a past medical history of Arthritis, Asthma, Cervical spondylosis without myelopathy, COPD (chronic obstructive pulmonary disease) (HCC), Diabetes mellitus without complication (HCC), Disorder of rotator cuff of both shoulders, Hypertension, and Prostate disease.  HPI Patient presents to the clinic for emergency department (ED) follow-up and dressing change following incision and drainage (I&D) of a right posterior shoulder abscess.  Review of Systems  Constitutional:  Negative for chills and fever.  Respiratory:  Negative for shortness of breath.   Cardiovascular:  Negative for chest pain.  Neurological:  Negative for dizziness and headaches.      Objective:     BP 135/84   Pulse 73   Ht 5' 7 (1.702 m)   Wt 133 lb (60.3 kg)   SpO2 95%   BMI 20.83 kg/m  BP Readings from Last 3 Encounters:  02/28/24 135/84  02/22/24 (!) 160/80  09/16/23 (!) 140/71      Physical Exam Vitals reviewed.  Constitutional:      General: Ronald Ward is not in acute distress.    Appearance: Normal appearance. Ronald [facility] is not ill-appearing, toxic-appearing or diaphoretic.  HENT:     Head: Normocephalic.  Eyes:     General:        Right eye: No discharge.        Left eye: No discharge.     Conjunctiva/sclera: Conjunctivae normal.  Cardiovascular:     Rate and Rhythm: Normal rate.     Pulses: Normal pulses.     Heart sounds: Normal heart sounds.  Pulmonary:     Effort: Pulmonary effort is normal. No respiratory distress.     Breath sounds: Normal breath sounds.  Skin:    General: Skin is warm and dry.  Neurological:     Mental Status: Ronald Ward is alert.  Psychiatric:        Mood and Affect: Mood normal.        Behavior: Behavior normal.      No results  found for any visits on 02/28/24.  The 10-year ASCVD risk score (Arnett DK, et al., 2019) is: 60.8%    Assessment & Plan:  Dressing change Assessment & Plan: Status Post (S/P) Incision and Drainage (I&D) of Right Posterior Shoulder Abscess The wound site showed slight yellow drainage and erythema along the edges. The area was cleansed with alcohol, and a Telfa dressing with gauze was applied. The patient tolerated the procedure well without any signs of discomfort.   Other orders -     Doxycycline  Hyclate; Take 1 tablet (100 mg total) by mouth 2 (two) times daily for 7 days.  Dispense: 14 tablet; Refill: 0    Return if symptoms worsen or fail to improve.   Hilario Kidd Wilhelmena Falter, FNP

## 2024-03-05 ENCOUNTER — Ambulatory Visit: Admitting: Nurse Practitioner

## 2024-03-09 ENCOUNTER — Encounter: Payer: Self-pay | Admitting: Nurse Practitioner

## 2024-03-09 ENCOUNTER — Other Ambulatory Visit: Payer: Self-pay

## 2024-03-09 ENCOUNTER — Ambulatory Visit: Admitting: Nurse Practitioner

## 2024-03-09 VITALS — BP 146/78 | HR 62 | Ht 67.0 in | Wt 125.6 lb

## 2024-03-09 DIAGNOSIS — F17219 Nicotine dependence, cigarettes, with unspecified nicotine-induced disorders: Secondary | ICD-10-CM

## 2024-03-09 DIAGNOSIS — F03A4 Unspecified dementia, mild, with anxiety: Secondary | ICD-10-CM

## 2024-03-09 DIAGNOSIS — Z122 Encounter for screening for malignant neoplasm of respiratory organs: Secondary | ICD-10-CM

## 2024-03-09 DIAGNOSIS — F320 Major depressive disorder, single episode, mild: Secondary | ICD-10-CM

## 2024-03-09 MED ORDER — CYCLOBENZAPRINE HCL 5 MG PO TABS: 5.0000 mg | ORAL_TABLET | Freq: Every day | ORAL | 1 refills | Status: DC

## 2024-03-09 MED ORDER — AMOXICILLIN-POT CLAVULANATE 875-125 MG PO TABS
1.0000 | ORAL_TABLET | Freq: Two times a day (BID) | ORAL | 0 refills | Status: DC
Start: 2024-03-09 — End: 2024-05-05

## 2024-03-09 MED ORDER — DONEPEZIL HCL 5 MG PO TABS: 5.0000 mg | ORAL_TABLET | Freq: Every day | ORAL | 1 refills | Status: DC

## 2024-03-09 MED ORDER — MEMANTINE HCL 10 MG PO TABS: 10.0000 mg | ORAL_TABLET | Freq: Two times a day (BID) | ORAL | 1 refills | Status: AC

## 2024-03-09 MED ORDER — MUPIROCIN 2 % EX OINT: 1.0000 | TOPICAL_OINTMENT | Freq: Two times a day (BID) | CUTANEOUS | 0 refills | Status: DC

## 2024-03-09 MED ORDER — METFORMIN HCL 500 MG PO TABS: 500.0000 mg | ORAL_TABLET | Freq: Two times a day (BID) | ORAL | 3 refills | Status: AC

## 2024-03-09 MED ORDER — ENALAPRIL MALEATE 2.5 MG PO TABS: 2.5000 mg | ORAL_TABLET | Freq: Every day | ORAL | 0 refills | Status: DC | PRN

## 2024-03-09 MED ORDER — ALBUTEROL SULFATE HFA 108 (90 BASE) MCG/ACT IN AERS: 1.0000 | INHALATION_SPRAY | Freq: Four times a day (QID) | RESPIRATORY_TRACT | 0 refills | Status: DC | PRN

## 2024-03-09 NOTE — Patient Instructions (Signed)
 1) Add on aricept  and namenda  2) Will double check if patient on duloxetine  and if so will wean off and then start sertraline  25-50 mg daily  3) Follow up appt in 4 weeks to re-assess depression

## 2024-03-09 NOTE — Addendum Note (Signed)
 Addended by: GLENNON SAND on: 03/09/2024 04:04 PM   Modules accepted: Orders

## 2024-03-09 NOTE — Progress Notes (Signed)
 Established Patient Office Visit  Subjective:  Patient ID: Ronald [facility], male    DOB: [birth year]  Age: 78 y.o. MRN: [id number]  Chief Complaint  Patient presents with   Dementia   Wound Check    Check wound on right shoulder    Patient here today with his daughter.  Has had a recent family member pass.  He is tearful today during office visit.  Performed MMSE 22 - will initiate aricept  and namenda  as well as antidepressant.  He denies any thoughts of harming self or others.  Wound Check    No other concerns at this time.   Past Medical History:  Diagnosis Date   Arthritis    Asthma    Cervical spondylosis without myelopathy    COPD (chronic obstructive pulmonary disease) (HCC)    Diabetes mellitus without complication (HCC)    Disorder of rotator cuff of both shoulders    Hypertension    Prostate disease     Past Surgical History:  Procedure Laterality Date   ANKLE SURGERY     Arm surgery     COLONOSCOPY N/A 11/22/2016   Procedure: COLONOSCOPY;  Surgeon: Claudis RAYMOND Rivet, MD;  Location: [facility];  Service: Endoscopy;  Laterality: N/A;  930   HOLEP-LASER ENUCLEATION OF THE PROSTATE WITH MORCELLATION N/A 09/13/2023   Procedure: HOLEP-LASER ENUCLEATION OF THE PROSTATE WITH MORCELLATION;  Surgeon: Francisca Redell BROCKS, MD;  Location: [facility] ORS;  Service: Urology;  Laterality: N/A;   PENILE ADHESIONS LYSIS      Social History   Socioeconomic History   Marital status: Divorced    Spouse name: Not on file   Number of children: Not on file   Years of education: Not on file   Highest education level: Not on file  Occupational History   Not on file  Tobacco Use   Smoking status: Every Day    Current packs/day: 1.00    Average packs/day: 0.8 packs/day for 63.5 years (49.0 ttl pk-yrs)    Types: Cigarettes    Start date: 08/31/1960    Last attempt to quit: 08/31/2018   Smokeless tobacco: Never  Vaping Use   Vaping status: Never Used  Substance and Sexual Activity    Alcohol use: Yes    Comment: occasionally    Drug use: No   Sexual activity: Not on file  Other Topics Concern   Not on file  Social History Narrative   Not on file   Social Drivers of Health   Financial Resource Strain: Low Risk  (04/09/2022)   Overall Financial Resource Strain (CARDIA)    Difficulty of Paying Living Expenses: Not hard at all  Food Insecurity: No Food Insecurity (04/09/2022)   Hunger Vital Sign    Worried About Running Out of Food in the Last Year: Never true    Ran Out of Food in the Last Year: Never true  Transportation Needs: Unmet Transportation Needs (04/09/2022)   PRAPARE - Transportation    Lack of Transportation (Medical): Yes    Lack of Transportation (Non-Medical): Yes  Physical Activity: Inactive (04/09/2022)   Exercise Vital Sign    Days of Exercise per Week: 0 days    Minutes of Exercise per Session: 0 min  Stress: No Stress Concern Present (04/09/2022)   [employer] of Occupational Health - Occupational Stress Questionnaire    Feeling of Stress : Not at all  Social Connections: Moderately Isolated (04/09/2022)   Social Connection and Isolation Panel    Frequency  of Communication with Friends and Family: Twice a week    Frequency of Social Gatherings with Friends and Family: Twice a week    Attends Religious Services: Never    Database administrator or Organizations: No    Attends Banker Meetings: Never    Marital Status: Married  Catering manager Violence: Not At Risk (04/09/2022)   Humiliation, Afraid, Rape, and Kick questionnaire    Fear of Current or Ex-Partner: No    Emotionally Abused: No    Physically Abused: No    Sexually Abused: No    Family History  Problem Relation Age of Onset   Heart attack Mother    Diabetes Mother    Diabetes Brother    Cancer Brother     Allergies  Allergen Reactions   Bee Venom Itching, Swelling and Other (See Comments)    Cough   Fish Allergy Itching, Swelling and Other (See  Comments)    Cough   Shellfish Allergy Itching, Swelling and Other (See Comments)    Cough    Outpatient Medications Prior to Visit  Medication Sig   albuterol  (PROVENTIL ) (2.5 MG/3ML) 0.083% nebulizer solution Take 3 mLs (2.5 mg total) by nebulization every 6 (six) hours as needed for wheezing or shortness of breath.   albuterol  (VENTOLIN  HFA) 108 (90 Base) MCG/ACT inhaler Inhale 1-2 puffs into the lungs every 6 (six) hours as needed for wheezing or shortness of breath.   amoxicillin  (AMOXIL ) 500 MG tablet Take 1 tablet (500 mg total) by mouth every 12 (twelve) hours. (Patient not taking: Reported on 09/13/2023)   Cholecalciferol (VITAMIN D3 ULTRA STRENGTH) 125 MCG (5000 UT) capsule Take 5,000 Units by mouth daily.   cyclobenzaprine  (FLEXERIL ) 5 MG tablet TAKE (1) TABLET BY MOUTH AT BEDTIME AS NEEDED.   DULoxetine  (CYMBALTA ) 60 MG capsule Take 1 capsule (60 mg total) by mouth daily.   enalapril  (VASOTEC ) 2.5 MG tablet Take 2.5 mg by mouth daily as needed (hight blood pressure).   EPINEPHrine  0.3 mg/0.3 mL IJ SOAJ injection Inject 0.3 mLs (0.3 mg total) into the muscle once as needed (for allergic reaction).   gabapentin  (NEURONTIN ) 300 MG capsule Take 300 mg by mouth 3 (three) times daily.   metFORMIN  (GLUCOPHAGE ) 500 MG tablet Take 1 tablet (500 mg total) by mouth 2 (two) times daily with a meal. (Patient taking differently: Take 500 mg by mouth 2 (two) times daily as needed (high blood sugar).)   No facility-administered medications prior to visit.    ROS     Objective:   BP (!) 146/78   Pulse 62   Ht 5' 7 (1.702 m)   Wt 125 lb 9.6 oz (57 kg)   SpO2 93%   BMI 19.67 kg/m   Vitals:   03/09/24 1458 03/09/24 1505  BP: (!) 157/82 (!) 146/78  Pulse: 62   Height: 5' 7 (1.702 m)   Weight: 125 lb 9.6 oz (57 kg)   SpO2: 93%   BMI (Calculated): 19.67     Physical Exam Vitals and nursing note reviewed.  Constitutional:      Appearance: Normal appearance.  HENT:     Head:  Normocephalic.     Nose: Nose normal.     Mouth/Throat:     Mouth: Mucous membranes are moist.  Eyes:     Pupils: Pupils are equal, round, and reactive to light.  Cardiovascular:     Rate and Rhythm: Normal rate and regular rhythm.     Pulses: Normal  pulses.     Heart sounds: Normal heart sounds.  Pulmonary:     Effort: Pulmonary effort is normal.     Breath sounds: Wheezing present.  Abdominal:     Palpations: Abdomen is soft.  Musculoskeletal:        General: Normal range of motion.     Cervical back: Normal range of motion.  Skin:    General: Skin is warm and dry.  Neurological:     Mental Status: He is alert. He is disoriented.     Comments: MMSE given today  Psychiatric:        Mood and Affect: Mood normal.        Behavior: Behavior normal.      No results found for any visits on 03/09/24.  Recent Results (from the past 2160 hours)  POC CBG, ED     Status: Abnormal   Collection Time: 02/22/24  3:10 PM  Result Value Ref Range   Glucose-Capillary 114 (H) 70 - 99 mg/dL    Comment: Glucose reference range applies only to samples taken after fasting for at least 8 hours.      Assessment & Plan:   Problem List Items Addressed This Visit   None   No follow-ups on file.   Total time spent: 25 minutes  Neale Carpen, NP  03/09/2024   This document may have been prepared by [facility] Voice Recognition software and as such may include unintentional dictation errors.

## 2024-03-10 ENCOUNTER — Telehealth: Payer: Self-pay | Admitting: Nurse Practitioner

## 2024-03-10 NOTE — Telephone Encounter (Unsigned)
 Copied from CRM [id number]. Topic: Clinical - Medication Question >> Mar 10, 2024  4:42 PM Tiffini S wrote: Reason for CRM: Patient daughter Quinn called about a new prescription for Sertraline  (Zoloft ) 50 mg- asked to send medication to:   [facility] [address]  [place], [place] [address] [phone]

## 2024-03-11 ENCOUNTER — Other Ambulatory Visit: Payer: Self-pay

## 2024-03-11 DIAGNOSIS — F320 Major depressive disorder, single episode, mild: Secondary | ICD-10-CM

## 2024-03-11 MED ORDER — SERTRALINE HCL 50 MG PO TABS: 50.0000 mg | ORAL_TABLET | Freq: Every day | ORAL | 3 refills | Status: DC

## 2024-04-06 ENCOUNTER — Telehealth: Payer: Self-pay | Admitting: Family Medicine

## 2024-04-06 ENCOUNTER — Ambulatory Visit: Admitting: Family Medicine

## 2024-04-06 NOTE — Telephone Encounter (Signed)
 Patient and daughter came in late to appt states patient has dementia and there was a miscommunication that happened before his appt. I rescheduled the patient but patient's daughter is requesting for Meade or nurse to call her. Please advise Thank you

## 2024-05-05 ENCOUNTER — Ambulatory Visit (INDEPENDENT_AMBULATORY_CARE_PROVIDER_SITE_OTHER): Admitting: Internal Medicine

## 2024-05-05 ENCOUNTER — Encounter: Payer: Self-pay | Admitting: Internal Medicine

## 2024-05-05 VITALS — BP 138/82 | HR 94 | Ht 67.0 in | Wt 124.4 lb

## 2024-05-05 DIAGNOSIS — J209 Acute bronchitis, unspecified: Secondary | ICD-10-CM | POA: Diagnosis not present

## 2024-05-05 DIAGNOSIS — F03A4 Unspecified dementia, mild, with anxiety: Secondary | ICD-10-CM | POA: Diagnosis not present

## 2024-05-05 DIAGNOSIS — E782 Mixed hyperlipidemia: Secondary | ICD-10-CM

## 2024-05-05 DIAGNOSIS — I10 Essential (primary) hypertension: Secondary | ICD-10-CM | POA: Diagnosis not present

## 2024-05-05 DIAGNOSIS — J440 Chronic obstructive pulmonary disease with acute lower respiratory infection: Secondary | ICD-10-CM

## 2024-05-05 DIAGNOSIS — F320 Major depressive disorder, single episode, mild: Secondary | ICD-10-CM

## 2024-05-05 DIAGNOSIS — E559 Vitamin D deficiency, unspecified: Secondary | ICD-10-CM | POA: Diagnosis not present

## 2024-05-05 DIAGNOSIS — R7303 Prediabetes: Secondary | ICD-10-CM | POA: Diagnosis not present

## 2024-05-05 MED ORDER — ALBUTEROL SULFATE HFA 108 (90 BASE) MCG/ACT IN AERS: 1.0000 | INHALATION_SPRAY | Freq: Four times a day (QID) | RESPIRATORY_TRACT | 0 refills | Status: DC | PRN

## 2024-05-05 MED ORDER — FLUTICASONE-SALMETEROL 100-50 MCG/ACT IN AEPB
1.0000 | INHALATION_SPRAY | Freq: Two times a day (BID) | RESPIRATORY_TRACT | 5 refills | Status: AC
Start: 2024-05-05 — End: ?

## 2024-05-05 MED ORDER — NEBULIZER MISC: 0 refills | Status: AC

## 2024-05-05 MED ORDER — SERTRALINE HCL 50 MG PO TABS: 50.0000 mg | ORAL_TABLET | Freq: Every day | ORAL | 3 refills | Status: DC

## 2024-05-05 MED ORDER — AZITHROMYCIN 250 MG PO TABS: ORAL_TABLET | ORAL | 0 refills | Status: AC

## 2024-05-05 MED ORDER — IPRATROPIUM-ALBUTEROL 0.5-2.5 (3) MG/3ML IN SOLN: RESPIRATORY_TRACT | 1 refills | Status: DC

## 2024-05-05 NOTE — Patient Instructions (Addendum)
 Please contact [facility] for discussion about home care.  [address] [place], [place] [address] Phone: [phone]  Please start taking Zoloft  as prescribed.  Please start taking Azithromycin  for acute bronchitis. Please start using Advair as maintenance inhaler regularly and use Duoneb or Albuterol  inhaler as needed for shortness of breath or wheezing.  Please continue to follow low carb diet and ambulate as tolerated.

## 2024-05-05 NOTE — Progress Notes (Signed)
 Established Patient Office Visit  Subjective:  Patient ID: Ronald Ward, male    DOB: July 16, 1946  Age: 78 y.o. MRN: [id number]  CC:  Chief Complaint  Patient presents with   Medication Refill    4 week f/u needs refills on dementia medications. Daughter would like to inquire about home health care to help him take medications.    Cough    Pt reports cough and some congestion and phlegm ongoing for a few months.     HPI Ronald Ward is a 78 y.o. male with past medical history of HTN, COPD, dementia, MDD and prediabetes who presents for f/u of his chronic medical conditions.  His daughter is present during the visit, who is a Publishing rights manager.  COPD: He reports chronic cough, worse for the last 2 weeks with clear expectoration.  Denies any fever or chills.  He has intermittent dyspnea and wheezing.  He has been using his ex-wife's inhalers.  Denies any episode of hemoptysis.  HTN: His BP is WNL.  He takes enalapril  2.5 mg QD currently.  Denies headache, dizziness, chest pain or palpitations currently.  Dementia: He was recently placed on donepezil  and Namenda  by NP.  He has been tolerating it well.  His daughter reports that he has been forgetting the conversation and she has to remind him repetitively.  She has noticed rapid worsening of his memory since patient's wife passed away in 14-Mar-2024.  He was placed on Zoloft  for MDD, but has not started taking it yet.  He currently lives by himself, but daughter is interested in finding resources for home care.    Past Medical History:  Diagnosis Date   Arthritis    Asthma    Cervical spondylosis without myelopathy    COPD (chronic obstructive pulmonary disease) (HCC)    Diabetes mellitus without complication (HCC)    Disorder of rotator cuff of both shoulders    Hypertension    Prostate disease     Past Surgical History:  Procedure Laterality Date   ANKLE SURGERY     Arm surgery     COLONOSCOPY N/A 11/22/2016   Procedure:  COLONOSCOPY;  Surgeon: Claudis RAYMOND Rivet, MD;  Location: [facility];  Service: Endoscopy;  Laterality: N/A;  930   HOLEP-LASER ENUCLEATION OF THE PROSTATE WITH MORCELLATION N/A 09/13/2023   Procedure: HOLEP-LASER ENUCLEATION OF THE PROSTATE WITH MORCELLATION;  Surgeon: Francisca Redell BROCKS, MD;  Location: [facility];  Service: Urology;  Laterality: N/A;   PENILE ADHESIONS LYSIS      Family History  Problem Relation Age of Onset   Heart attack Mother    Diabetes Mother    Diabetes Brother    Cancer Brother     Social History   Socioeconomic History   Marital status: Divorced    Spouse name: Not on file   Number of children: Not on file   Years of education: Not on file   Highest education level: Not on file  Occupational History   Not on file  Tobacco Use   Smoking status: Every Day    Current packs/day: 1.00    Average packs/day: 0.8 packs/day for 63.7 years (49.2 ttl pk-yrs)    Types: Cigarettes    Start date: 08/31/1960    Last attempt to quit: 08/31/2018   Smokeless tobacco: Never  Vaping Use   Vaping status: Never Used  Substance and Sexual Activity   Alcohol use: Yes    Comment: occasionally    Drug use:  No   Sexual activity: Not on file  Other Topics Concern   Not on file  Social History Narrative   Not on file   Social Drivers of Health   Financial Resource Strain: Low Risk  (04/09/2022)   Overall Financial Resource Strain (CARDIA)    Difficulty of Paying Living Expenses: Not hard at all  Food Insecurity: No Food Insecurity (04/09/2022)   Hunger Vital Sign    Worried About Running Out of Food in the Last Year: Never true    Ran Out of Food in the Last Year: Never true  Transportation Needs: Unmet Transportation Needs (04/09/2022)   PRAPARE - Transportation    Lack of Transportation (Medical): Yes    Lack of Transportation (Non-Medical): Yes  Physical Activity: Inactive (04/09/2022)   Exercise Vital Sign    Days of Exercise per Week: 0 days    Minutes of Exercise  per Session: 0 min  Stress: No Stress Concern Present (04/09/2022)   [employer] of Occupational Health - Occupational Stress Questionnaire    Feeling of Stress : Not at all  Social Connections: Moderately Isolated (04/09/2022)   Social Connection and Isolation Panel    Frequency of Communication with Friends and Family: Twice a week    Frequency of Social Gatherings with Friends and Family: Twice a week    Attends Religious Services: Never    Database administrator or Organizations: No    Attends Banker Meetings: Never    Marital Status: Married  Catering manager Violence: Not At Risk (04/09/2022)   Humiliation, Afraid, Rape, and Kick questionnaire    Fear of Current or Ex-Partner: No    Emotionally Abused: No    Physically Abused: No    Sexually Abused: No    Outpatient Medications Prior to Visit  Medication Sig Dispense Refill   Cholecalciferol (VITAMIN D3 ULTRA STRENGTH) 125 MCG (5000 UT) capsule Take 5,000 Units by mouth daily.     cyclobenzaprine  (FLEXERIL ) 5 MG tablet Take 1 tablet (5 mg total) by mouth at bedtime. 30 tablet 1   donepezil  (ARICEPT ) 5 MG tablet Take 1 tablet (5 mg total) by mouth at bedtime. 90 tablet 1   enalapril  (VASOTEC ) 2.5 MG tablet Take 1 tablet (2.5 mg total) by mouth daily as needed (hight blood pressure). 90 tablet 0   EPINEPHrine  0.3 mg/0.3 mL IJ SOAJ injection Inject 0.3 mLs (0.3 mg total) into the muscle once as needed (for allergic reaction). 2 Device 1   gabapentin  (NEURONTIN ) 300 MG capsule Take 300 mg by mouth 3 (three) times daily.     memantine  (NAMENDA ) 10 MG tablet Take 1 tablet (10 mg total) by mouth 2 (two) times daily. 180 tablet 1   metFORMIN  (GLUCOPHAGE ) 500 MG tablet Take 1 tablet (500 mg total) by mouth 2 (two) times daily with a meal. 90 tablet 3   mupirocin  ointment (BACTROBAN ) 2 % Apply 1 Application topically 2 (two) times daily. 22 g 0   albuterol  (PROVENTIL ) (2.5 MG/3ML) 0.083% nebulizer solution Take 3 mLs (2.5  mg total) by nebulization every 6 (six) hours as needed for wheezing or shortness of breath. 75 mL 12   albuterol  (VENTOLIN  HFA) 108 (90 Base) MCG/ACT inhaler Inhale 1-2 puffs into the lungs every 6 (six) hours as needed for wheezing or shortness of breath. 1 each 0   sertraline  (ZOLOFT ) 50 MG tablet Take 1 tablet (50 mg total) by mouth daily. 30 tablet 3   amoxicillin -clavulanate (AUGMENTIN ) 875-125 MG tablet Take  1 tablet by mouth 2 (two) times daily. 20 tablet 0   No facility-administered medications prior to visit.    Allergies  Allergen Reactions   Bee Venom Itching, Swelling and Other (See Comments)    Cough   Fish Allergy Itching, Swelling and Other (See Comments)    Cough   Shellfish Allergy Itching, Swelling and Other (See Comments)    Cough    ROS Review of Systems  Constitutional:  Negative for chills and fever.  HENT:  Negative for congestion and sore throat.   Eyes:  Negative for pain and discharge.  Respiratory:  Positive for cough and shortness of breath.   Cardiovascular:  Negative for chest pain and palpitations.  Gastrointestinal:  Negative for diarrhea, nausea and vomiting.  Endocrine: Negative for polydipsia and polyuria.  Genitourinary:  Negative for dysuria and hematuria.  Musculoskeletal:  Negative for neck pain and neck stiffness.  Skin:  Negative for rash.  Neurological:  Negative for dizziness and weakness.  Psychiatric/Behavioral:  Positive for decreased concentration. Negative for agitation and behavioral problems. The patient is nervous/anxious.       Objective:    Physical Exam Vitals reviewed.  Constitutional:      General: He is not in acute distress.    Appearance: He is not diaphoretic.  HENT:     Head: Normocephalic and atraumatic.     Nose: Nose normal.     Mouth/Throat:     Mouth: Mucous membranes are moist.  Eyes:     General: No scleral icterus.    Extraocular Movements: Extraocular movements intact.  Cardiovascular:     Rate  and Rhythm: Normal rate and regular rhythm.     Heart sounds: Normal heart sounds. No murmur heard. Pulmonary:     Breath sounds: Wheezing (Mild, bilateral) present. No rales.  Musculoskeletal:     Cervical back: Neck supple. No tenderness.     Right lower leg: No edema.     Left lower leg: No edema.  Skin:    General: Skin is warm.     Findings: No rash.  Neurological:     General: No focal deficit present.     Mental Status: He is alert and oriented to person, place, and time.     Sensory: No sensory deficit.     Motor: No weakness.  Psychiatric:        Mood and Affect: Mood normal.        Behavior: Behavior normal.     BP 138/82   Pulse 94   Ht 5' 7 (1.702 m)   Wt 124 lb 6.4 oz (56.4 kg)   SpO2 100%   BMI 19.48 kg/m  Wt Readings from Last 3 Encounters:  05/05/24 124 lb 6.4 oz (56.4 kg)  03/09/24 125 lb 9.6 oz (57 kg)  02/28/24 133 lb (60.3 kg)    Lab Results  Component Value Date   TSH 1.660 05/23/2022   Lab Results  Component Value Date   WBC 6.7 07/14/2023   HGB 13.4 07/14/2023   HCT 43.2 07/14/2023   MCV 94.3 07/14/2023   PLT 218 07/14/2023   Lab Results  Component Value Date   NA 138 07/14/2023   K 4.3 07/14/2023   CO2 26 07/14/2023   GLUCOSE 175 (H) 07/14/2023   BUN 31 (H) 07/14/2023   CREATININE 1.13 07/14/2023   BILITOT 0.3 05/28/2023   ALKPHOS 82 05/28/2023   AST 17 05/28/2023   ALT 11 05/28/2023   PROT 6.6 05/28/2023  ALBUMIN 3.3 (L) 05/28/2023   CALCIUM 9.6 07/14/2023   ANIONGAP 11 07/14/2023   EGFR 64 05/23/2022   Lab Results  Component Value Date   CHOL 154 05/23/2022   Lab Results  Component Value Date   HDL 54 05/23/2022   Lab Results  Component Value Date   LDLCALC 64 05/23/2022   Lab Results  Component Value Date   TRIG 223 (H) 05/23/2022   Lab Results  Component Value Date   CHOLHDL 2.9 05/23/2022   Lab Results  Component Value Date   HGBA1C 6.5 (H) 05/23/2022      Assessment & Plan:   Problem List  Items Addressed This Visit       Cardiovascular and Mediastinum   Essential hypertension   BP Readings from Last 1 Encounters:  05/05/24 138/82   Well-controlled with enalapril  2.5 mg QD, advised to take it regularly instead of as needed Counseled for compliance with the medications Advised DASH diet and moderate exercise/walking as tolerated      Relevant Orders   TSH   CMP14+EGFR   CBC with Differential/Platelet     Respiratory   Acute bronchitis with COPD (HCC)   Recent worsening of chronic cough, dyspnea and wheezing Started empiric azithromycin  Would avoid oral steroids due to history of prediabetes Started Advair as maintenance inhaler Refilled albuterol  inhaler, prescribed DuoNeb for dyspnea or wheezing Needs to cut down -> quit smoking      Relevant Medications   azithromycin  (ZITHROMAX ) 250 MG tablet   ipratropium-albuterol  (DUONEB) 0.5-2.5 (3) MG/3ML SOLN   fluticasone -salmeterol (ADVAIR) 100-50 MCG/ACT AEPB   albuterol  (VENTOLIN  HFA) 108 (90 Base) MCG/ACT inhaler   Nebulizer MISC     Nervous and Auditory   Mild dementia with anxiety (HCC) - Primary   He was recently placed on donepezil  5 mg nightly and Namenda  10 mg BID by NP, since he has tolerated them, advised him to continue for now Grief reaction/MDD likely contributing to recent worsening of memory Needs to start taking Zoloft  for MDD  Check CBC, CMP, TSH, vitamin D  and B12  Offered home health referral, but they will only provide nursing visit in his case - since the daughter is more interested in household help, advised to contact [facility] or other local agencies for home aide Follow-up with PCP after 3 months      Relevant Medications   sertraline  (ZOLOFT ) 50 MG tablet   Other Relevant Orders   TSH   CMP14+EGFR   CBC with Differential/Platelet   B12     Other   Depression, major, single episode, mild   Recently lost his wife, likely has grief reaction as well Has had anhedonia,  lack of interest in routine activities and decreased appetite Needs to start taking Zoloft       Relevant Medications   sertraline  (ZOLOFT ) 50 MG tablet   Prediabetes   Lab Results  Component Value Date   HGBA1C 6.5 (H) 05/23/2022   On metformin  500 mg twice daily, but compliance is questionable Check CMP and HbA1c      Relevant Orders   Hemoglobin A1c   CMP14+EGFR   Other Visit Diagnoses       Vitamin D  deficiency       Relevant Orders   VITAMIN D  25 Hydroxy (Vit-D Deficiency, Fractures)     Mixed hyperlipidemia       Relevant Orders   Lipid panel       Meds ordered this encounter  Medications  sertraline  (ZOLOFT ) 50 MG tablet    Sig: Take 1 tablet (50 mg total) by mouth daily.    Dispense:  30 tablet    Refill:  3   azithromycin  (ZITHROMAX ) 250 MG tablet    Sig: Take 2 tablets on day 1, then 1 tablet daily on days 2 through 5    Dispense:  6 tablet    Refill:  0   ipratropium-albuterol  (DUONEB) 0.5-2.5 (3) MG/3ML SOLN    Sig: Take 3 mLs by nebulization every 4 (four) hours or as needed for shortness of breath or wheezing.    Dispense:  360 mL    Refill:  1    Please dispense Nebulizer machine.   fluticasone -salmeterol (ADVAIR) 100-50 MCG/ACT AEPB    Sig: Inhale 1 puff into the lungs 2 (two) times daily.    Dispense:  60 each    Refill:  5   albuterol  (VENTOLIN  HFA) 108 (90 Base) MCG/ACT inhaler    Sig: Inhale 1-2 puffs into the lungs every 6 (six) hours as needed for wheezing or shortness of breath.    Dispense:  1 each    Refill:  0   Nebulizer MISC    Sig: Nebulizer machine and supplies - For using Duoneb every 4 hours as needed for shortness of breath or wheezing.    Dispense:  1 each    Refill:  0    Follow-up: Return in about 3 months (around 08/04/2024) for COPD and dementia.    Suzzane MARLA Blanch, MD

## 2024-05-05 NOTE — Assessment & Plan Note (Signed)
 Recently lost his wife, likely has grief reaction as well Has had anhedonia, lack of interest in routine activities and decreased appetite Needs to start taking Zoloft 

## 2024-05-05 NOTE — Assessment & Plan Note (Signed)
 Recent worsening of chronic cough, dyspnea and wheezing Started empiric azithromycin  Would avoid oral steroids due to history of prediabetes Started Advair as maintenance inhaler Refilled albuterol  inhaler, prescribed DuoNeb for dyspnea or wheezing Needs to cut down -> quit smoking

## 2024-05-05 NOTE — Assessment & Plan Note (Signed)
 BP Readings from Last 1 Encounters:  05/05/24 138/82   Well-controlled with enalapril  2.5 mg QD, advised to take it regularly instead of as needed Counseled for compliance with the medications Advised DASH diet and moderate exercise/walking as tolerated

## 2024-05-05 NOTE — Assessment & Plan Note (Addendum)
 He was recently placed on donepezil  5 mg nightly and Namenda  10 mg BID by NP, since he has tolerated them, advised him to continue for now Grief reaction/MDD likely contributing to recent worsening of memory Needs to start taking Zoloft  for MDD  Check CBC, CMP, TSH, vitamin D  and B12  Offered home health referral, but they will only provide nursing visit in his case - since the daughter is more interested in household help, advised to contact [facility] or other local agencies for home aide Follow-up with PCP after 3 months

## 2024-05-05 NOTE — Assessment & Plan Note (Signed)
 Lab Results  Component Value Date   HGBA1C 6.5 (H) 05/23/2022   On metformin  500 mg twice daily, but compliance is questionable Check CMP and HbA1c

## 2024-05-06 ENCOUNTER — Ambulatory Visit: Payer: Self-pay | Admitting: Internal Medicine

## 2024-05-06 LAB — CBC WITH DIFFERENTIAL/PLATELET
Basophils Absolute: 0 x10E3/uL (ref 0.0–0.2)
Basos: 1 %
EOS (ABSOLUTE): 0.1 x10E3/uL (ref 0.0–0.4)
Eos: 2 %
Hematocrit: 42.4 % (ref 37.5–51.0)
Hemoglobin: 13.3 g/dL (ref 13.0–17.7)
Immature Grans (Abs): 0 x10E3/uL (ref 0.0–0.1)
Immature Granulocytes: 0 %
Lymphocytes Absolute: 1.7 x10E3/uL (ref 0.7–3.1)
Lymphs: 50 %
MCH: 29 pg (ref 26.6–33.0)
MCHC: 31.4 g/dL — ABNORMAL LOW (ref 31.5–35.7)
MCV: 92 fL (ref 79–97)
Monocytes Absolute: 0.4 x10E3/uL (ref 0.1–0.9)
Monocytes: 13 %
Neutrophils Absolute: 1.1 x10E3/uL — ABNORMAL LOW (ref 1.4–7.0)
Neutrophils: 34 %
Platelets: 188 x10E3/uL (ref 150–450)
RBC: 4.59 x10E6/uL (ref 4.14–5.80)
RDW: 13.5 % (ref 11.6–15.4)
WBC: 3.3 x10E3/uL — ABNORMAL LOW (ref 3.4–10.8)

## 2024-05-06 LAB — LIPID PANEL
Chol/HDL Ratio: 3.1 ratio (ref 0.0–5.0)
Cholesterol, Total: 178 mg/dL (ref 100–199)
HDL: 58 mg/dL (ref >39)
LDL Chol Calc (NIH): 100 mg/dL — ABNORMAL HIGH (ref 0–99)
Triglycerides: 113 mg/dL (ref 0–149)
VLDL Cholesterol Cal: 20 mg/dL (ref 5–40)

## 2024-05-06 LAB — VITAMIN B12: Vitamin B-12: 342 pg/mL (ref 232–1245)

## 2024-05-06 LAB — TSH: TSH: 2.72 u[IU]/mL (ref 0.450–4.500)

## 2024-05-06 LAB — CMP14+EGFR
ALT: 8 IU/L (ref 0–44)
AST: 20 IU/L (ref 0–40)
Albumin: 4 g/dL (ref 3.8–4.8)
Alkaline Phosphatase: 98 IU/L (ref 47–123)
BUN/Creatinine Ratio: 14 (ref 10–24)
BUN: 13 mg/dL (ref 8–27)
Bilirubin Total: 0.5 mg/dL (ref 0.0–1.2)
CO2: 25 mmol/L (ref 20–29)
Calcium: 9.6 mg/dL (ref 8.6–10.2)
Chloride: 100 mmol/L (ref 96–106)
Creatinine, Ser: 0.94 mg/dL (ref 0.76–1.27)
Globulin, Total: 2.9 g/dL (ref 1.5–4.5)
Glucose: 107 mg/dL — ABNORMAL HIGH (ref 70–99)
Potassium: 4.2 mmol/L (ref 3.5–5.2)
Sodium: 141 mmol/L (ref 134–144)
Total Protein: 6.9 g/dL (ref 6.0–8.5)
eGFR: 82 mL/min/1.73 (ref >59)

## 2024-05-06 LAB — VITAMIN D 25 HYDROXY (VIT D DEFICIENCY, FRACTURES): Vit D, 25-Hydroxy: 29.3 ng/mL — ABNORMAL LOW (ref 30.0–100.0)

## 2024-05-06 LAB — HEMOGLOBIN A1C
Est. average glucose Bld gHb Est-mCnc: 137 mg/dL
Hgb A1c MFr Bld: 6.4 % — ABNORMAL HIGH (ref 4.8–5.6)

## 2024-06-15 ENCOUNTER — Telehealth: Payer: Self-pay | Admitting: Family Medicine

## 2024-06-15 NOTE — Telephone Encounter (Signed)
 [facility] forms Copied Noted Sleeved Original forms placed provider box(patel last seen ) Copy placed front desk folder

## 2024-06-15 NOTE — Telephone Encounter (Signed)
 [name] (daughter) came into office with the patient ,she is asking Ronald Ward to give her a call she would like a phone call from her. Her phone #  [phone].

## 2024-06-16 ENCOUNTER — Other Ambulatory Visit: Payer: Self-pay

## 2024-06-16 DIAGNOSIS — J209 Acute bronchitis, unspecified: Secondary | ICD-10-CM

## 2024-06-16 MED ORDER — ALBUTEROL SULFATE HFA 108 (90 BASE) MCG/ACT IN AERS: 1.0000 | INHALATION_SPRAY | Freq: Four times a day (QID) | RESPIRATORY_TRACT | 0 refills | Status: AC | PRN

## 2024-06-16 NOTE — Telephone Encounter (Signed)
 In patel folder for signing

## 2024-06-17 NOTE — Telephone Encounter (Signed)
 Left detailed vm for patient , if he needs a copy I have it at my desk. Otherwise originals sent to scan.

## 2024-06-18 ENCOUNTER — Other Ambulatory Visit: Payer: Self-pay | Admitting: Internal Medicine

## 2024-06-18 ENCOUNTER — Other Ambulatory Visit: Payer: Self-pay

## 2024-06-18 DIAGNOSIS — J209 Acute bronchitis, unspecified: Secondary | ICD-10-CM

## 2024-06-18 MED ORDER — ENALAPRIL MALEATE 2.5 MG PO TABS: 2.5000 mg | ORAL_TABLET | Freq: Every day | ORAL | 0 refills | Status: DC | PRN

## 2024-06-23 ENCOUNTER — Ambulatory Visit (INDEPENDENT_AMBULATORY_CARE_PROVIDER_SITE_OTHER)

## 2024-06-23 VITALS — Ht 67.0 in | Wt 125.0 lb

## 2024-06-23 DIAGNOSIS — F17210 Nicotine dependence, cigarettes, uncomplicated: Secondary | ICD-10-CM

## 2024-06-23 DIAGNOSIS — Z0001 Encounter for general adult medical examination with abnormal findings: Secondary | ICD-10-CM | POA: Diagnosis not present

## 2024-06-23 DIAGNOSIS — Z0000 Encounter for general adult medical examination without abnormal findings: Secondary | ICD-10-CM

## 2024-06-23 NOTE — Progress Notes (Unsigned)
 Subjective:   Ronald Ward is a 78 y.o. male who presents for a Medicare Annual Wellness Visit.  Allergies (verified) Bee venom, Fish allergy, and Shellfish allergy   History: Past Medical History:  Diagnosis Date   Arthritis    Asthma    Cervical spondylosis without myelopathy    COPD (chronic obstructive pulmonary disease) (HCC)    Diabetes mellitus without complication (HCC)    Disorder of rotator cuff of both shoulders    Hypertension    Prostate disease    Past Surgical History:  Procedure Laterality Date   ANKLE SURGERY     Arm surgery     COLONOSCOPY N/A 11/22/2016   Procedure: COLONOSCOPY;  Surgeon: Claudis RAYMOND Rivet, MD;  Location: [facility];  Service: Endoscopy;  Laterality: N/A;  930   HOLEP-LASER ENUCLEATION OF THE PROSTATE WITH MORCELLATION N/A 09/13/2023   Procedure: HOLEP-LASER ENUCLEATION OF THE PROSTATE WITH MORCELLATION;  Surgeon: Francisca Redell BROCKS, MD;  Location: [facility] ORS;  Service: Urology;  Laterality: N/A;   PENILE ADHESIONS LYSIS     Family History  Problem Relation Age of Onset   Heart attack Mother    Diabetes Mother    Diabetes Brother    Cancer Brother    Social History   Occupational History   Not on file  Tobacco Use   Smoking status: Every Day    Current packs/day: 1.00    Average packs/day: 0.8 packs/day for 63.8 years (49.3 ttl pk-yrs)    Types: Cigarettes    Start date: 08/31/1960    Last attempt to quit: 08/31/2018   Smokeless tobacco: Never  Vaping Use   Vaping status: Never Used  Substance and Sexual Activity   Alcohol use: Yes    Comment: occasionally    Drug use: No   Sexual activity: Not on file   Tobacco Counseling Ready to quit: No Counseling given: Yes  SDOH Screenings   Food Insecurity: No Food Insecurity (04/09/2022)  Housing: Low Risk  (04/09/2022)  Transportation Needs: Unmet Transportation Needs (04/09/2022)  Alcohol Screen: Low Risk  (04/09/2022)  Depression (PHQ2-9): Low Risk  (05/05/2024)  Financial  Resource Strain: Low Risk  (04/09/2022)  Physical Activity: Inactive (04/09/2022)  Social Connections: Moderately Isolated (04/09/2022)  Stress: No Stress Concern Present (04/09/2022)  Tobacco Use: High Risk (06/23/2024)   Depression Screen    05/05/2024    9:52 AM 03/09/2024    2:59 PM 02/28/2024    8:50 AM 12/11/2022    9:13 AM 09/18/2022    1:57 PM 05/23/2022    9:24 AM 04/23/2022   10:20 AM  PHQ 2/9 Scores  PHQ - 2 Score 0 0 0 0 0 0 0  PHQ- 9 Score 0  0  0  0         Data saved with a previous flowsheet row definition     Goals Addressed               This Visit's Progress     remain active and healthy (pt-stated)         Visit info / Clinical Intake: Medicare Wellness Visit Type:: Subsequent Annual Wellness Visit Persons participating in visit:: patient & caregiver (daughter: [name] [facility])) Medicare Wellness Visit Mode:: Telephone If telephone:: video declined Because this visit was a virtual/telehealth visit:: pt reported vitals If Telephone or Video please confirm:: I connected with the patient using audio enabled telemedicine application and verified that I am speaking with the correct person using two  identifiers; I discussed the limitations of evaluation and management by telemedicine; The patient expressed understanding and agreed to proceed Patient Location:: home Provider Location:: office Information given by:: patient; family Interpreter Needed?: No Pre-visit prep was completed: yes AWV questionnaire completed by patient prior to visit?: no Living arrangements:: with family/others Patient's Overall Health Status Rating: very good Typical amount of pain: some Does pain affect daily life?: no Are you currently prescribed opioids?: no  Dietary Habits and Nutritional Risks How many meals a day?: 3 Eats fruit and vegetables daily?: yes Most meals are obtained by: preparing own meals In the last 2 weeks, have you had any of the following?:  none Diabetic:: (!) yes Any non-healing wounds?: no How often do you check your BS?: as needed Would you like to be referred to a Nutritionist or for Diabetic Management? : no  Functional Status Activities of Daily Living (to include ambulation/medication): (!) Needs Assist Feeding: Independent Dressing/Grooming: Independent Bathing: Independent Toileting: Independent Transfer: Independent with device- listed below Ambulation: Independent with device- listed below Home Assistive Devices/Equipment: Cane Medication Administration: Independent (daughter makes sure medications are filled correctly) Home Management: Independent Manage your own finances?: yes (daughter helps with finances as well but patient can still manage alone) Primary transportation is: family/friends Concerns about vision?: no *vision screening is required for WTM* Concerns about hearing?: no  Fall Screening Falls in the past year?: 0 Number of falls in past year: 0 Was there an injury with Fall?: 0 Fall Risk Category Calculator: 0 Patient Fall Risk Level: Low Fall Risk  Fall Risk Patient at Risk for Falls Due to: No Fall Risks Fall risk Follow up: Falls evaluation completed; Education provided; Falls prevention discussed  Home and Transportation Safety: All rugs have non-skid backing?: N/A, no rugs All stairs or steps have railings?: yes Grab bars in the bathtub or shower?: (!) no Have non-skid surface in bathtub or shower?: (!) no Good home lighting?: yes Regular seat belt use?: yes Hospital stays in the last year:: no  Cognitive Assessment Difficulty concentrating, remembering, or making decisions? : yes (dementia) Will 6CIT or Mini Cog be Completed: no 6CIT or Mini Cog Declined: patient has a diagnosis of dementia or cognitive impairment  Advance Directives (For Healthcare) Does Patient Have a Medical Advance Directive?: Yes Type of Advance Directive: Healthcare Power of [place]; Living  will Copy of Healthcare Power of Attorney in Chart?: No - copy requested Copy of Living Will in Chart?: No - copy requested Would patient like information on creating a medical advance directive?: No - Patient declined  Reviewed/Updated  Reviewed/Updated: Reviewed All (Medical, Surgical, Family, Medications, Allergies, Care Teams, Patient Goals)        Objective:    Today's Vitals   06/23/24 0930  Weight: 125 lb (56.7 kg)  Height: 5' 7 (1.702 m)   Body mass index is 19.58 kg/m.  Current Medications (verified) Outpatient Encounter Medications as of 06/23/2024  Medication Sig   albuterol  (VENTOLIN  HFA) 108 (90 Base) MCG/ACT inhaler Inhale 1-2 puffs into the lungs every 6 (six) hours as needed for wheezing or shortness of breath.   ammonium lactate (AMLACTIN) 12 % cream Apply 1 Application topically 2 (two) times daily.   cyclobenzaprine  (FLEXERIL ) 5 MG tablet Take 1 tablet (5 mg total) by mouth at bedtime.   donepezil  (ARICEPT ) 5 MG tablet Take 1 tablet (5 mg total) by mouth at bedtime.   enalapril  (VASOTEC ) 2.5 MG tablet Take 2.5 mg by mouth daily.   EPINEPHrine  0.3 mg/0.3  mL IJ SOAJ injection Inject 0.3 mLs (0.3 mg total) into the muscle once as needed (for allergic reaction).   fluticasone -salmeterol (ADVAIR) 100-50 MCG/ACT AEPB Inhale 1 puff into the lungs 2 (two) times daily.   ipratropium-albuterol  (DUONEB) 0.5-2.5 (3) MG/3ML SOLN Take 3 mLs by nebulization every 4 (four) hours or as needed for shortness of breath or wheezing.   memantine  (NAMENDA ) 10 MG tablet Take 1 tablet (10 mg total) by mouth 2 (two) times daily.   metFORMIN  (GLUCOPHAGE ) 500 MG tablet Take 1 tablet (500 mg total) by mouth 2 (two) times daily with a meal.   Nebulizer MISC Nebulizer machine and supplies - For using Duoneb every 4 hours as needed for shortness of breath or wheezing.   sertraline  (ZOLOFT ) 50 MG tablet Take 1 tablet (50 mg total) by mouth daily.   [DISCONTINUED] enalapril  (VASOTEC ) 2.5 MG  tablet Take 1 tablet (2.5 mg total) by mouth daily as needed (hight blood pressure).   Cholecalciferol (VITAMIN D3 ULTRA STRENGTH) 125 MCG (5000 UT) capsule Take 5,000 Units by mouth daily. (Patient not taking: Reported on 06/23/2024)   gabapentin  (NEURONTIN ) 300 MG capsule Take 300 mg by mouth 3 (three) times daily. (Patient not taking: Reported on 06/23/2024)   [DISCONTINUED] mupirocin  ointment (BACTROBAN ) 2 % Apply 1 Application topically 2 (two) times daily. (Patient not taking: Reported on 06/23/2024)   No facility-administered encounter medications on file as of 06/23/2024.   Hearing/Vision screen No results found. Immunizations and Health Maintenance Health Maintenance  Topic Date Due   Lung Cancer Screening  Never done   Zoster Vaccines- Shingrix (1 of 2) Never done   Medicare Annual Wellness (AWV)  04/10/2023   Influenza Vaccine  03/13/2024   COVID-19 Vaccine (3 - 2025-26 season) 04/13/2024   DTaP/Tdap/Td (3 - Td or Tdap) 05/17/2029   Pneumococcal Vaccine: 50+ Years  Completed   Hepatitis C Screening  Completed   Meningococcal B Vaccine  Aged Out   Colonoscopy  Discontinued        Assessment/Plan:  This is a routine wellness examination for Verlie.  Patient Care Team: Bacchus, Meade PEDLAR, FNP as PCP - General (Family Medicine) Roddie Bring, DPM as Consulting Physician (Podiatry)  I have personally reviewed and noted the following in the patient's chart:   Medical and social history Use of alcohol, tobacco or illicit drugs  Current medications and supplements including opioid prescriptions. Functional ability and status Nutritional status Physical activity Advanced directives List of other physicians Hospitalizations, surgeries, and ER visits in previous 12 months Vitals Screenings to include cognitive, depression, and falls Referrals and appointments  Orders Placed This Encounter  Procedures   Ambulatory Referral for Lung Cancer Scre    Referral Priority:    Routine    Referral Type:   Consultation    Referral Reason:   Specialty Services Required    Number of Visits Requested:   1   In addition, I have reviewed and discussed with patient certain preventive protocols, quality metrics, and best practice recommendations. A written personalized care plan for preventive services as well as general preventive health recommendations were provided to patient.   Madolin Twaddle, CMA   06/23/2024   No follow-ups on file.  After Visit Summary: {CHL AMB AWV After Visit [phone]}  Nurse Notes: ***

## 2024-06-24 NOTE — Patient Instructions (Signed)
 Ronald Ward,  Thank you for taking the time for your Medicare Wellness Visit. I appreciate your continued commitment to your health goals. Please review the care plan we discussed, and feel free to reach out if I can assist you further.  Please note that Annual Wellness Visits do not include a physical exam. Some assessments may be limited, especially if the visit was conducted virtually. If needed, we may recommend an in-person follow-up with your provider.  Ongoing Care Seeing your primary care provider every 3 to 6 months helps us  monitor your health and provide consistent, personalized care.   Referrals If a referral was made during today's visit and you haven't received any updates within two weeks, please contact the referred provider directly to check on the status.  Lung Cancer Screening-[place] Office [address]-First Floor Medical Building directly across from [facility] Phone Number:[phone]   Recommended Screenings:  Health Maintenance  Topic Date Due   Screening for Lung Cancer  Never done   Zoster (Shingles) Vaccine (1 of 2) Never done   Flu Shot  03/13/2024   COVID-19 Vaccine (3 - 2025-26 season) 04/13/2024   Medicare Annual Wellness Visit  06/23/2025   DTaP/Tdap/Td vaccine (3 - Td or Tdap) 05/17/2029   Pneumococcal Vaccine for age over 38  Completed   Hepatitis C Screening  Completed   Meningitis B Vaccine  Aged Out   Colon Cancer Screening  Discontinued       06/23/2024    9:38 AM  Advanced Directives  Does Patient Have a Medical Advance Directive? Yes  Type of Estate [place];Living will  Copy of Healthcare Power of Attorney in Chart? No - copy requested    Vision: Annual vision screenings are recommended for early detection of glaucoma, cataracts, and diabetic retinopathy. These exams can also reveal signs of chronic conditions such as diabetes and high blood pressure.  Dental: Annual dental screenings help  detect early signs of oral cancer, gum disease, and other conditions linked to overall health, including heart disease and diabetes.  Please see the attached documents for additional preventive care recommendations.

## 2024-07-29 ENCOUNTER — Ambulatory Visit: Admitting: Internal Medicine

## 2024-07-29 NOTE — Progress Notes (Deleted)
 Ronald Ward, male    DOB: 09-20-1945    MRN: [id number]   Brief patient profile:  78  yo*** *** referred to pulmonary clinic in [place]  07/29/2024 by *** for ***   Pt not previously seen by PCCM service.     History of Present Illness  07/29/2024  Pulmonary/ 1st office eval/ Ronald Ward / [place] [facility]  No chief complaint on file.    Dyspnea:  *** Cough: *** Sleep: *** SABA use: *** 02: *** LDSCT:***  No obvious day to day or daytime pattern/variability or assoc excess/ purulent sputum or mucus plugs or hemoptysis or cp or chest tightness, subjective wheeze or overt sinus or hb symptoms.    Also denies any obvious fluctuation of symptoms with weather or environmental changes or other aggravating or alleviating factors except as outlined above   No unusual exposure hx or h/o childhood pna/ asthma or knowledge of premature birth.  Current Allergies, Complete Past Medical History, Past Surgical History, Family History, and Social History were reviewed in [employer] record.  ROS  The following are not active complaints unless bolded Hoarseness, sore throat, dysphagia, dental problems, itching, sneezing,  nasal congestion or discharge of excess mucus or purulent secretions, ear ache,   fever, chills, sweats, unintended wt loss or wt gain, classically pleuritic or exertional cp,  orthopnea pnd or arm/hand swelling  or leg swelling, presyncope, palpitations, abdominal pain, anorexia, nausea, vomiting, diarrhea  or change in bowel habits or change in bladder habits, change in stools or change in urine, dysuria, hematuria,  rash, arthralgias, visual complaints, headache, numbness, weakness or ataxia or problems with walking or coordination,  change in mood or  memory.            Outpatient Medications Prior to Visit  Medication Sig Dispense Refill   albuterol  (VENTOLIN  HFA) 108 (90 Base) MCG/ACT inhaler Inhale 1-2 puffs into the lungs every 6 (six) hours as  needed for wheezing or shortness of breath. 1 each 0   ammonium lactate (AMLACTIN) 12 % cream Apply 1 Application topically 2 (two) times daily.     Cholecalciferol (VITAMIN D3 ULTRA STRENGTH) 125 MCG (5000 UT) capsule Take 5,000 Units by mouth daily. (Patient not taking: Reported on 06/23/2024)     cyclobenzaprine  (FLEXERIL ) 5 MG tablet Take 1 tablet (5 mg total) by mouth at bedtime. 30 tablet 1   donepezil  (ARICEPT ) 5 MG tablet Take 1 tablet (5 mg total) by mouth at bedtime. 90 tablet 1   enalapril  (VASOTEC ) 2.5 MG tablet Take 2.5 mg by mouth daily.     EPINEPHrine  0.3 mg/0.3 mL IJ SOAJ injection Inject 0.3 mLs (0.3 mg total) into the muscle once as needed (for allergic reaction). 2 Device 1   fluticasone -salmeterol (ADVAIR) 100-50 MCG/ACT AEPB Inhale 1 puff into the lungs 2 (two) times daily. 60 each 5   gabapentin  (NEURONTIN ) 300 MG capsule Take 300 mg by mouth 3 (three) times daily. (Patient not taking: Reported on 06/23/2024)     ipratropium-albuterol  (DUONEB) 0.5-2.5 (3) MG/3ML SOLN Take 3 mLs by nebulization every 4 (four) hours or as needed for shortness of breath or wheezing. 360 mL 1   memantine  (NAMENDA ) 10 MG tablet Take 1 tablet (10 mg total) by mouth 2 (two) times daily. 180 tablet 1   metFORMIN  (GLUCOPHAGE ) 500 MG tablet Take 1 tablet (500 mg total) by mouth 2 (two) times daily with a meal. 90 tablet 3   Nebulizer MISC Nebulizer machine and supplies -  For using Duoneb every 4 hours as needed for shortness of breath or wheezing. 1 each 0   sertraline  (ZOLOFT ) 50 MG tablet Take 1 tablet (50 mg total) by mouth daily. 30 tablet 3   No facility-administered medications prior to visit.    Past Medical History:  Diagnosis Date   Arthritis    Asthma    Cervical spondylosis without myelopathy    COPD (chronic obstructive pulmonary disease) (HCC)    Diabetes mellitus without complication (HCC)    Disorder of rotator cuff of both shoulders    Hypertension    Prostate disease        Objective:     There were no vitals taken for this visit.         Assessment

## 2024-07-30 ENCOUNTER — Ambulatory Visit (HOSPITAL_COMMUNITY)
Admission: RE | Admit: 2024-07-30 | Discharge: 2024-07-30 | Disposition: A | Source: Ambulatory Visit | Attending: Internal Medicine | Admitting: Internal Medicine

## 2024-07-30 ENCOUNTER — Encounter: Payer: Self-pay | Admitting: Internal Medicine

## 2024-07-30 ENCOUNTER — Ambulatory Visit: Admitting: Internal Medicine

## 2024-07-30 VITALS — BP 128/79 | HR 73 | Ht 67.0 in | Wt 126.0 lb

## 2024-07-30 DIAGNOSIS — I10 Essential (primary) hypertension: Secondary | ICD-10-CM | POA: Diagnosis not present

## 2024-07-30 DIAGNOSIS — F17210 Nicotine dependence, cigarettes, uncomplicated: Secondary | ICD-10-CM | POA: Diagnosis not present

## 2024-07-30 DIAGNOSIS — R058 Other specified cough: Secondary | ICD-10-CM | POA: Diagnosis present

## 2024-07-30 MED ORDER — VALSARTAN 80 MG PO TABS: 80.0000 mg | ORAL_TABLET | Freq: Every day | ORAL | 11 refills | Status: AC

## 2024-07-30 NOTE — Patient Instructions (Addendum)
 Stop vasotec  (enalapril )  and advair for next 6 weeks   Duoneb up to 4 x daily  as needed until cough is gone and breathing is better and don't need the mucinex  DM any more   Cough/ congestion >  Mucinex   DM 1200 mg twice daily (availableover the counter)   New blood pressure pill called valsartan  80 mg one daily (in place of enalapril )    Please remember to go to the  x-ray department  @  [facility] for your tests - we will call you with the results when they are available      Please schedule a follow up office visit in 6 weeks, call sooner if needed with all medications /inhalers/ solutions in hand so we can verify exactly what you are taking. This includes all medications from all doctors and over the counters

## 2024-07-30 NOTE — Progress Notes (Unsigned)
 Ronald Ward, male    DOB: 1946/03/05    MRN: [id number]   Brief patient profile:  2  yobm active smoker  referred to pulmonary clinic in [place]  07/30/2024 by Leita Longs  for copd eval    Pt not previously seen by [facility] service.     History of Present Illness  07/30/2024  Pulmonary/ 1st office eval/ Allen Basista / [place] [facility]  Chief Complaint  Patient presents with   Establish Care    LCS  Emphysema shob/cough    Dyspnea:  walks to store 2 blocks  Cough: white mucus all day long  Sleep: once asleep on bunch pillows  SABA use: 3 x weekly  02: none     No obvious day to day or daytime pattern/variability or assoc excess/ purulent sputum or mucus plugs or hemoptysis or cp or chest tightness, subjective wheeze or overt sinus or hb symptoms.    Also denies any obvious fluctuation of symptoms with weather or environmental changes or other aggravating or alleviating factors except as outlined above   No unusual exposure hx or h/o childhood pna/ asthma or knowledge of premature birth.  Current Allergies, Complete Past Medical History, Past Surgical History, Family History, and Social History were reviewed in [employer] record.  ROS  The following are not active complaints unless bolded Hoarseness, sore throat, dysphagia, dental problems, itching, sneezing,  nasal congestion or discharge of excess mucus or purulent secretions, ear ache,   fever, chills, sweats, unintended wt loss or wt gain, classically pleuritic or exertional cp,  orthopnea pnd or arm/hand swelling  or leg swelling, presyncope, palpitations, abdominal pain, anorexia, nausea, vomiting, diarrhea  or change in bowel habits or change in bladder habits, change in stools or change in urine, dysuria, hematuria,  rash, arthralgias, visual complaints, headache, numbness, weakness or ataxia or problems with walking or coordination,  change in mood or  memory.            Outpatient Medications  Prior to Visit  Medication Sig Dispense Refill   albuterol  (VENTOLIN  HFA) 108 (90 Base) MCG/ACT inhaler Inhale 1-2 puffs into the lungs every 6 (six) hours as needed for wheezing or shortness of breath. 1 each 0   ammonium lactate (AMLACTIN) 12 % cream Apply 1 Application topically 2 (two) times daily.     Cholecalciferol (VITAMIN D3 ULTRA STRENGTH) 125 MCG (5000 UT) capsule Take 5,000 Units by mouth daily. (Patient not taking: Reported on 06/23/2024)     cyclobenzaprine  (FLEXERIL ) 5 MG tablet Take 1 tablet (5 mg total) by mouth at bedtime. 30 tablet 1   donepezil  (ARICEPT ) 5 MG tablet Take 1 tablet (5 mg total) by mouth at bedtime. 90 tablet 1   enalapril  (VASOTEC ) 2.5 MG tablet Take 2.5 mg by mouth daily.     EPINEPHrine  0.3 mg/0.3 mL IJ SOAJ injection Inject 0.3 mLs (0.3 mg total) into the muscle once as needed (for allergic reaction). 2 Device 1   fluticasone -salmeterol (ADVAIR) 100-50 MCG/ACT AEPB Inhale 1 puff into the lungs 2 (two) times daily. 60 each 5   gabapentin  (NEURONTIN ) 300 MG capsule Take 300 mg by mouth 3 (three) times daily. (Patient not taking: Reported on 06/23/2024)     ipratropium-albuterol  (DUONEB) 0.5-2.5 (3) MG/3ML SOLN Take 3 mLs by nebulization every 4 (four) hours or as needed for shortness of breath or wheezing. 360 mL 1   memantine  (NAMENDA ) 10 MG tablet Take 1 tablet (10 mg total) by mouth 2 (two) times  daily. 180 tablet 1   metFORMIN  (GLUCOPHAGE ) 500 MG tablet Take 1 tablet (500 mg total) by mouth 2 (two) times daily with a meal. 90 tablet 3   Nebulizer MISC Nebulizer machine and supplies - For using Duoneb every 4 hours as needed for shortness of breath or wheezing. 1 each 0   sertraline  (ZOLOFT ) 50 MG tablet Take 1 tablet (50 mg total) by mouth daily. 30 tablet 3   No facility-administered medications prior to visit.    Past Medical History:  Diagnosis Date   Arthritis    Asthma    Cervical spondylosis without myelopathy    COPD (chronic obstructive  pulmonary disease) (HCC)    Diabetes mellitus without complication (HCC)    Disorder of rotator cuff of both shoulders    Hypertension    Prostate disease       Objective:     BP 128/79   Pulse 73   Ht 5' 7 (1.702 m)   Wt 126 lb (57.2 kg)   SpO2 94% Comment: ra  BMI 19.73 kg/m   SpO2: 94 % (ra) amb bm easily confused with details    HEENT : Oropharynx  clear/ full dentures  Nasal turbinates nl    NECK :  without  apparent JVD/ palpable Nodes/TM / mild pseudowheeze    LUNGS: no acc muscle use,  Min barrel  contour chest wall with bilateral  slightly decreased bs s audible wheeze and  without cough on insp or exp maneuvers and min  Hyperresonant  to  percussion bilaterally    CV:  RRR  no s3 or murmur or increase in P2, and no edema   ABD:  soft and nontender    MS:  Nl gait/ ext warm without deformities Or obvious joint restrictions  calf tenderness, cyanosis or clubbing     SKIN: warm and dry without lesions    NEURO:  alert, approp, nl sensorium with  no motor or cerebellar deficits apparent.            Assessment

## 2024-07-31 ENCOUNTER — Encounter: Payer: Self-pay | Admitting: Internal Medicine

## 2024-07-31 ENCOUNTER — Other Ambulatory Visit: Payer: Self-pay | Admitting: Family Medicine

## 2024-07-31 DIAGNOSIS — Z122 Encounter for screening for malignant neoplasm of respiratory organs: Secondary | ICD-10-CM

## 2024-08-01 DIAGNOSIS — F17210 Nicotine dependence, cigarettes, uncomplicated: Secondary | ICD-10-CM | POA: Insufficient documentation

## 2024-08-01 NOTE — Assessment & Plan Note (Addendum)
 D/c ACEi 07/30/2024 due to cough / pseudowheeze   In the best review of chronic cough to date ( NEJM 2016 375 [id number]) ,  ACEi are now felt to cause cough in up to  20% of pts which is a 4 fold increase from previous reports and does not include the variety of non-specific complaints we see in pulmonary clinic in pts on ACEi but previously attributed to another dx like  Copd/asthma and  include PNDS, throat and chest congestion, bronchitis, unexplained dyspnea and noct strangling sensations, and hoarseness, but also  atypical /refractory GERD symptoms like dysphagia and bad heartburn   The only way I know  to prove this is not an ACEi Case is a trial off ACEi x a minimum of 6 weeks then regroup.   >>>  try valsartan  80 mg daily until return in 6 weeks in place of vasotec 

## 2024-08-01 NOTE — Assessment & Plan Note (Addendum)
 Counseled re importance of smoking cessation but did not meet time criteria for separate billing           Each maintenance medication was reviewed in detail including emphasizing most importantly the difference between maintenance and prns and under what circumstances the prns are to be triggered using an action plan format where appropriate.  Total time for H and P, chart review, counseling, reviewing hfa/neb/dpi  device(s) and generating customized AVS unique to this office visit / same day charting = 46 min new pt eval

## 2024-08-01 NOTE — Assessment & Plan Note (Addendum)
 Onset ? In pt with dementia on advair discus  >>> rec 07/30/2024 d/c advair / vasotec  and re check in 6 weeks just on neb prn and mucinex  dm   Comment: Upper airway cough syndrome (previously labeled PNDS),  is so named because it's frequently impossible to sort out how much is  CR/sinusitis with freq throat clearing (which can be related to primary GERD)   vs  causing  secondary ( extra esophageal)  GERD from wide swings in gastric pressure that occur with throat clearing, often  promoting self use of mint and menthol lozenges that reduce the lower esophageal sphincter tone and exacerbate the problem further in a cyclical fashion.   These are the same pts (now being labeled as having irritable larynx syndrome by some cough centers) who not infrequently have a history of having failed to tolerate ace inhibitors,  dry powder inhalers(especially advair)  or biphosphonates or report having atypical/extraesophageal reflux symptoms from LPR (globus, throat clearing)  that don't respond to standard doses of PPI  and are easily confused as having aecopd or asthma flares by even experienced allergists/ pulmonologists (myself included).    >>> see avs for instructions unique to this ov

## 2024-08-02 NOTE — Progress Notes (Signed)
 Pharmacy Quality Measure Review  This patient is appearing on a report for being at risk of failing the adherence measure for hypertension (ACEi/ARB) medications this calendar year.   Medication: enalapril  2.5 mg  Last fill date: 07/16/2024 for 30 day supply  Insurance report was not up to date. No action needed at this time.   Woodie Jock, PharmD PGY1 Pharmacy Resident  08/02/2024

## 2024-08-16 ENCOUNTER — Ambulatory Visit: Payer: Self-pay | Admitting: Internal Medicine

## 2024-09-04 ENCOUNTER — Encounter: Payer: Self-pay | Admitting: Family Medicine

## 2024-09-04 ENCOUNTER — Telehealth (INDEPENDENT_AMBULATORY_CARE_PROVIDER_SITE_OTHER): Admitting: Family Medicine

## 2024-09-04 DIAGNOSIS — R7301 Impaired fasting glucose: Secondary | ICD-10-CM | POA: Diagnosis not present

## 2024-09-04 DIAGNOSIS — E559 Vitamin D deficiency, unspecified: Secondary | ICD-10-CM | POA: Diagnosis not present

## 2024-09-04 DIAGNOSIS — E7849 Other hyperlipidemia: Secondary | ICD-10-CM | POA: Diagnosis not present

## 2024-09-04 DIAGNOSIS — J441 Chronic obstructive pulmonary disease with (acute) exacerbation: Secondary | ICD-10-CM

## 2024-09-04 DIAGNOSIS — E038 Other specified hypothyroidism: Secondary | ICD-10-CM | POA: Diagnosis not present

## 2024-09-04 MED ORDER — PREDNISONE 20 MG PO TABS: 40.0000 mg | ORAL_TABLET | Freq: Every day | ORAL | 0 refills | Status: AC

## 2024-09-04 MED ORDER — AZITHROMYCIN 250 MG PO TABS: ORAL_TABLET | ORAL | 0 refills | Status: AC

## 2024-09-04 NOTE — Progress Notes (Unsigned)
 "  Virtual Visit via Video Note  I connected with Ronald Ward Reason on 09/06/24 at 10:20 AM EST by a video enabled telemedicine application and verified that I am speaking with the correct person using two identifiers.  Patient Location: Home Provider Location: Home Office  I discussed the limitations, risks, security, and privacy concerns of performing an evaluation and management service by video and the availability of in person appointments. I also discussed with the patient that there may be a patient responsible charge related to this service. The patient expressed understanding and agreed to proceed.  Subjective: PCP: Edman Meade PEDLAR, FNP  Chief Complaint  Patient presents with   Medical Management of Chronic Issues    Follow up    HPI The patient was seen today accompanied by her daughter. They are requesting a copy of the PCS forms, as they report that the home health agency did not receive the previously faxed documents.  The daughter also reports that her father has been experiencing a persistent cough productive of green sputum, along with wheezing, shortness of breath, fatigue, and a low-grade fever.  ROS: Per HPI Current Medications[1]  Observations/Objective: There were no vitals filed for this visit. Physical Exam Patient is well-developed, well-nourished in no acute distress.  Resting comfortably at home.  Head is normocephalic, atraumatic.  No labored breathing.  Speech is clear and coherent with logical content.  Patient is alert and oriented at baseline.   Assessment and Plan: COPD exacerbation (HCC) Assessment & Plan: Will initiate therapy with a Z-Pak (azithromycin ) as prescribed. Blood glucose has been well controlled; therefore, will initiate prednisone  for 5 days. The patient was advised to closely monitor blood sugar levels, as corticosteroids may cause temporary hyperglycemia. Encouraged to continue using her inhaler as prescribed. The patient was  instructed to follow up promptly or seek emergency care for any worsening symptoms, including: Increasing shortness of breath or wheezing Persistent or high-grade fever Chest pain Worsening cough or sputum production Signs of uncontrolled blood sugar (excessive thirst, frequent urination, confusion)   Orders: -     predniSONE ; Take 2 tablets (40 mg total) by mouth daily for 5 days.  Dispense: 10 tablet; Refill: 0 -     Azithromycin ; Take 2 tablets on day 1, then 1 tablet daily on days 2 through 5  Dispense: 6 tablet; Refill: 0  IFG (impaired fasting glucose) -     Hemoglobin A1c  Vitamin D  deficiency -     VITAMIN D  25 Hydroxy (Vit-D Deficiency, Fractures)  TSH (thyroid -stimulating hormone deficiency) -     TSH + free T4  Other hyperlipidemia -     Lipid panel -     CMP14+EGFR -     CBC with Differential/Platelet    Follow Up Instructions: No follow-ups on file.   I discussed the assessment and treatment plan with the patient. The patient was provided an opportunity to ask questions, and all were answered. The patient agreed with the plan and demonstrated an understanding of the instructions.   The patient was advised to call back or seek an in-person evaluation if the symptoms worsen or if the condition fails to improve as anticipated.  The above assessment and management plan was discussed with the patient. The patient verbalized understanding of and has agreed to the management plan.   Ronald Ward  Z Bacchus, FNP     [1]  Current Outpatient Medications:    albuterol  (VENTOLIN  HFA) 108 (90 Base) MCG/ACT inhaler, Inhale 1-2 puffs into  the lungs every 6 (six) hours as needed for wheezing or shortness of breath., Disp: 1 each, Rfl: 0   ammonium lactate (AMLACTIN) 12 % cream, Apply 1 Application topically 2 (two) times daily., Disp: , Rfl:    azithromycin  (ZITHROMAX ) 250 MG tablet, Take 2 tablets on day 1, then 1 tablet daily on days 2 through 5, Disp: 6 tablet, Rfl: 0    Cholecalciferol (VITAMIN D3 ULTRA STRENGTH) 125 MCG (5000 UT) capsule, Take 5,000 Units by mouth daily., Disp: , Rfl:    cyclobenzaprine  (FLEXERIL ) 5 MG tablet, Take 1 tablet (5 mg total) by mouth at bedtime., Disp: 30 tablet, Rfl: 1   donepezil  (ARICEPT ) 5 MG tablet, Take 1 tablet (5 mg total) by mouth at bedtime., Disp: 90 tablet, Rfl: 1   EPINEPHrine  0.3 mg/0.3 mL IJ SOAJ injection, Inject 0.3 mLs (0.3 mg total) into the muscle once as needed (for allergic reaction)., Disp: 2 Device, Rfl: 1   ipratropium-albuterol  (DUONEB) 0.5-2.5 (3) MG/3ML SOLN, Take 3 mLs by nebulization every 4 (four) hours or as needed for shortness of breath or wheezing., Disp: 360 mL, Rfl: 1   memantine  (NAMENDA ) 10 MG tablet, Take 1 tablet (10 mg total) by mouth 2 (two) times daily., Disp: 180 tablet, Rfl: 1   metFORMIN  (GLUCOPHAGE ) 500 MG tablet, Take 1 tablet (500 mg total) by mouth 2 (two) times daily with a meal., Disp: 90 tablet, Rfl: 3   Nebulizer MISC, Nebulizer machine and supplies - For using Duoneb every 4 hours as needed for shortness of breath or wheezing., Disp: 1 each, Rfl: 0   predniSONE  (DELTASONE ) 20 MG tablet, Take 2 tablets (40 mg total) by mouth daily for 5 days., Disp: 10 tablet, Rfl: 0   sertraline  (ZOLOFT ) 50 MG tablet, Take 1 tablet (50 mg total) by mouth daily., Disp: 30 tablet, Rfl: 3   valsartan  (DIOVAN ) 80 MG tablet, Take 1 tablet (80 mg total) by mouth daily., Disp: 30 tablet, Rfl: 11   gabapentin  (NEURONTIN ) 300 MG capsule, Take 300 mg by mouth 3 (three) times daily. (Patient not taking: Reported on 09/04/2024), Disp: , Rfl:   "

## 2024-09-06 DIAGNOSIS — J441 Chronic obstructive pulmonary disease with (acute) exacerbation: Secondary | ICD-10-CM | POA: Insufficient documentation

## 2024-09-06 NOTE — Assessment & Plan Note (Signed)
 Will initiate therapy with a Z-Pak (azithromycin ) as prescribed. Blood glucose has been well controlled; therefore, will initiate prednisone  for 5 days. The patient was advised to closely monitor blood sugar levels, as corticosteroids may cause temporary hyperglycemia. Encouraged to continue using her inhaler as prescribed. The patient was instructed to follow up promptly or seek emergency care for any worsening symptoms, including: Increasing shortness of breath or wheezing Persistent or high-grade fever Chest pain Worsening cough or sputum production Signs of uncontrolled blood sugar (excessive thirst, frequent urination, confusion)

## 2024-09-08 ENCOUNTER — Ambulatory Visit: Admitting: Internal Medicine

## 2024-09-08 ENCOUNTER — Ambulatory Visit (HOSPITAL_COMMUNITY)
Admission: RE | Admit: 2024-09-08 | Discharge: 2024-09-08 | Disposition: A | Discharge status: Home / Self Care | Source: Ambulatory Visit | Attending: Internal Medicine | Admitting: Internal Medicine

## 2024-09-08 ENCOUNTER — Encounter: Payer: Self-pay | Admitting: Internal Medicine

## 2024-09-08 VITALS — BP 124/77 | HR 85 | Ht 67.0 in | Wt 136.0 lb

## 2024-09-08 DIAGNOSIS — R058 Other specified cough: Secondary | ICD-10-CM | POA: Diagnosis present

## 2024-09-08 DIAGNOSIS — F17210 Nicotine dependence, cigarettes, uncomplicated: Secondary | ICD-10-CM

## 2024-09-08 DIAGNOSIS — J4541 Moderate persistent asthma with (acute) exacerbation: Secondary | ICD-10-CM | POA: Diagnosis not present

## 2024-09-08 DIAGNOSIS — I10 Essential (primary) hypertension: Secondary | ICD-10-CM

## 2024-09-08 DIAGNOSIS — J45901 Unspecified asthma with (acute) exacerbation: Secondary | ICD-10-CM | POA: Insufficient documentation

## 2024-09-08 MED ORDER — BUDESONIDE 0.25 MG/2ML IN SUSP: RESPIRATORY_TRACT | 12 refills | Status: AC

## 2024-09-08 MED ORDER — METHYLPREDNISOLONE ACETATE 80 MG/ML IJ SUSP
120.0000 mg | Freq: Once | INTRAMUSCULAR | Status: AC
  Administered 2024-09-08: 120 mg via INTRAMUSCULAR

## 2024-09-08 MED ORDER — PROMETHAZINE-DM 6.25-15 MG/5ML PO SYRP: 5.0000 mL | ORAL_SOLUTION | Freq: Four times a day (QID) | ORAL | 0 refills | Status: AC | PRN

## 2024-09-08 MED ORDER — FAMOTIDINE 20 MG PO TABS: ORAL_TABLET | ORAL | 11 refills | Status: AC

## 2024-09-08 MED ORDER — PREDNISONE 10 MG PO TABS: ORAL_TABLET | ORAL | 0 refills | Status: AC

## 2024-09-08 MED ORDER — PANTOPRAZOLE SODIUM 40 MG PO TBEC: 40.0000 mg | DELAYED_RELEASE_TABLET | Freq: Every day | ORAL | 2 refills | Status: AC

## 2024-09-08 NOTE — Assessment & Plan Note (Addendum)
 D/c ACEi 07/30/2024 due to cough / pseudowheeze > improved transiently then URI flared the cough again   Although even in retrospect it may not be clear the ACEi contributed to the pt's symptoms,  Pt improved off them and adding them back at this point or in the future would risk confusion in interpretation of non-specific respiratory symptoms to which this patient is prone  ie  Better not to muddy the waters here.  - BP well controlled on present rx   >>> continue valsartan  80 mg one daily

## 2024-09-08 NOTE — Progress Notes (Addendum)
 "   Ronald Ward, male    DOB: May 18, 1946    MRN: [id number]   Brief patient profile:  69  yobm active smoker  referred to pulmonary clinic in [place]  07/30/2024 by Ronald Ward  for copd eval     History of Present Illness  07/30/2024  Pulmonary/ 1st office eval/ Ronald Ward / Ronald Ward Office on Vasotec / advair 100 DPI Chief Complaint  Patient presents with   Establish Care    LCS  Emphysema shob/cough    Dyspnea:  walks to store 2 blocks slower pace than others  Cough: min  white mucus production when clears throat  all day long / better p neb  x years  Sleep: once asleep on bunch pillows does fine  SABA use: 3 x weekly  02: none  Patient Instructions  Stop vasotec  (enalapril )  and advair for next 6 weeks  Duoneb up to 4 x daily  as needed until cough is gone and breathing is better and don't need the mucinex  DM any more  Cough/ congestion >  Mucinex   DM 1200 mg twice daily (available over the counter)  New blood pressure pill called valsartan  80 mg one daily (in place of enalapril )   Please schedule a follow up office visit in 6 weeks, call sooner if needed with all medications /inhalers/ solutions in hand so we can verify exactly what you are taking. This includes all medications from all doctors and over the counters.    09/08/2024 ACUTE  ov/Ronald Ward office/Ronald Ward re: UACS on ACEi was much better until one week prior to OV  maint on duoneb qid   finished pred/ zpak 09/08/2024 / quit smoking x  one week while daughter rx with phenergan  DM seemed to help.  Chief Complaint  Patient presents with   Cough    Thick yellow / Shob / Wheezing x2 day had zpak and preds over the weekends and has been using nebs symptoms have improved at rest  Dyspnea:  last neb 30 min prior to arrive Cough: rattling a bit/ not as productive since zpak rx/ was slt blood streaked  Sleeping: sleeps on back / almost flat frequent cough and neb needed at night      SABA use: as above  02: none     No  obvious day to day or daytime variability or assoc   or cp or chest tightness,   or overt sinus or hb symptoms.    Also denies any obvious fluctuation of symptoms with weather or environmental changes or other aggravating or alleviating factors except as outlined above   No unusual exposure hx or h/o childhood pna/ asthma or knowledge of premature birth.  Current Allergies, Complete Past Medical History, Past Surgical History, Family History, and Social History were reviewed in Ronald Ward record.  ROS  The following are not active complaints unless bolded Hoarseness, sore throat, dysphagia, dental problems, itching, sneezing,  nasal congestion or discharge of excess mucus or purulent secretions, ear ache,   fever, chills, sweats, unintended wt loss or wt gain, classically pleuritic or exertional cp,  orthopnea pnd or arm/hand swelling  or leg swelling, presyncope, palpitations, abdominal pain, anorexia, nausea, vomiting, diarrhea  or change in bowel habits or change in bladder habits, change in stools or change in urine, dysuria, hematuria,  rash, arthralgias, visual complaints, headache, numbness, weakness or ataxia or problems with walking or coordination,  change in mood or  memory.  Outpatient Medications Prior to Visit  Medication Sig Dispense Refill   cyclobenzaprine  (FLEXERIL ) 5 MG tablet Take 1 tablet (5 mg total) by mouth at bedtime. 30 tablet 1   donepezil  (ARICEPT ) 5 MG tablet Take 1 tablet (5 mg total) by mouth at bedtime. 90 tablet 1   EPINEPHrine  0.3 mg/0.3 mL IJ SOAJ injection Inject 0.3 mLs (0.3 mg total) into the muscle once as needed (for allergic reaction). 2 Device 1   ipratropium-albuterol  (DUONEB) 0.5-2.5 (3) MG/3ML SOLN Take 3 mLs by nebulization every 4 (four) hours or as needed for shortness of breath or wheezing. 360 mL 1   memantine  (NAMENDA ) 10 MG tablet Take 1 tablet (10 mg total) by mouth 2 (two) times daily. 180 tablet 1   metFORMIN   (GLUCOPHAGE ) 500 MG tablet Take 1 tablet (500 mg total) by mouth 2 (two) times daily with a meal. 90 tablet 3   Nebulizer MISC Nebulizer machine and supplies - For using Duoneb every 4 hours as needed for shortness of breath or wheezing. 1 each 0   sertraline  (ZOLOFT ) 50 MG tablet Take 1 tablet (50 mg total) by mouth daily. 30 tablet 3   valsartan  (DIOVAN ) 80 MG tablet Take 1 tablet (80 mg total) by mouth daily. 30 tablet 11   albuterol  (VENTOLIN  HFA) 108 (90 Base) MCG/ACT inhaler Inhale 1-2 puffs into the lungs every 6 (six) hours as needed for wheezing or shortness of breath. (Patient not taking: Reported on 09/08/2024) 1 each 0   ammonium lactate (AMLACTIN) 12 % cream Apply 1 Application topically 2 (two) times daily. (Patient not taking: Reported on 09/08/2024)     azithromycin  (ZITHROMAX ) 250 MG tablet Take 2 tablets on day 1, then 1 tablet daily on days 2 through 5 (Patient not taking: Reported on 09/08/2024) 6 tablet 0   Cholecalciferol (VITAMIN D3 ULTRA STRENGTH) 125 MCG (5000 UT) capsule Take 5,000 Units by mouth daily. (Patient not taking: Reported on 09/08/2024)     gabapentin  (NEURONTIN ) 300 MG capsule Take 300 mg by mouth 3 (three) times daily. (Patient not taking: Reported on 09/08/2024)     predniSONE  (DELTASONE ) 20 MG tablet Take 2 tablets (40 mg total) by mouth daily for 5 days. (Patient not taking: Reported on 09/08/2024) 10 tablet 0   No facility-administered medications prior to visit.       Past Medical History:  Diagnosis Date   Arthritis    Asthma    Cervical spondylosis without myelopathy    COPD (chronic obstructive pulmonary disease) (HCC)    Diabetes mellitus without complication (HCC)    Disorder of rotator cuff of both shoulders    Hypertension    Prostate disease       Objective:     Wt Readings from Last 3 Encounters:  09/08/24 136 lb (61.7 kg)  07/30/24 126 lb (57.2 kg)  06/23/24 125 lb (56.7 kg)     Vital signs reviewed  09/08/2024  - Note at rest 02  sats  92% on RA   General appearance:    amb stoic bm prominent wheeze     HEENT : Oropharynx  clear/ full dentures  Nasal turbinates nl    NECK :  without  apparent JVD/ palpable Nodes/TM    LUNGS: no acc muscle use,  Min barrel  contour chest wall with bilateral  slightly decreased bs s audible wheeze and  without cough on insp or exp maneuvers and min  Hyperresonant  to  percussion bilaterally    CV:  RRR  no s3 or murmur or increase in P2, and no edema   ABD:  soft and nontender    MS:  walks with cane/ ext warm without deformities Or obvious joint restrictions  calf tenderness, cyanosis or clubbing     SKIN: warm and dry without lesions    NEURO:  alert, poor recall  with  no motor or cerebellar deficits apparent.         CXR PA and Lateral:   09/08/2024 :    I personally reviewed images and impression is as follows:     Ok x for elevated L HD same since at least jan 2016 (the first film in [facility])        Assessment   Assessment & Plan Upper airway cough syndrome Onset ? In pt with dementia on advair discus and ACEi  - 07/30/2024 d/c advair / vasotec  and re check in 6 weeks just on neb prn and mucinex  dm  - 09/08/2024 ov with URI p apparent improvement off acei and advair  s/p zpak/pred by PCP with prominent pseudowheeze on exam.  Of the three most common causes of  Sub-acute / recurrent or chronic cough, only one (GERD)  can actually contribute to/ trigger  the other two (asthma and post nasal drip syndrome)  and perpetuate the cylce of cough.  While not intuitively obvious, many patients with chronic low grade reflux do not cough until there is a primary insult that disturbs the protective epithelial barrier and exposes sensitive nerve endings.   This is typically viral but can due to PNDS and  the former probably applies here.     >>>  The point is that once this occurs, it is difficult to eliminate the cycle  using anything but a maximally effective acid suppression  regimen at least in the short run, accompanied by an appropriate diet to address non acid GERD and control / eliminate the cough itself for at least 5 days with phenergan  DM  and  also added another 6 day taper off  Prednisone  starting at 40 mg per day in case of component of Th-2 driven upper or lower airways inflammation (if cough responds short term only to relapse before return while will on full rx for uacs (as above), then that would point to allergic rhinitis/ asthma or eos bronchitis as alternative dx)    Reviewed challenge with his daughter NP  re adherence and need for close f/u if not improving p apparent URI subsides    Essential hypertension D/c ACEi 07/30/2024 due to cough / pseudowheeze > improved transiently then URI flared the cough again   Although even in retrospect it may not be clear the ACEi contributed to the pt's symptoms,  Pt improved off them and adding them back at this point or in the future would risk confusion in interpretation of non-specific respiratory symptoms to which this patient is prone  ie  Better not to muddy the waters here.  - BP well controlled on present rx   >>> continue valsartan  80 mg one daily   Cigarette smoker Has stopped while sick so challenge will be to continue off cigs once URI resolves.       Moderate persistent asthmatic bronchitis with acute exacerbation Not clear how much of this is AB vs UACS  >>> cover AB with duoneb qid with budesonide  0.25 mg bid until next ov         Each maintenance medication was reviewed in detail including  emphasizing most importantly the difference between maintenance and prns and under what circumstances the prns are to be triggered using an action plan format where appropriate.  Total time for H and P, chart review, counseling, reviewing neb  device(s) and generating customized AVS unique to this office visit / same day charting = 42 min   For patient with multiple chronic  refractory respiratory   symptoms of uncertain etiology           AVS  Patient Instructions  Duoneb four times daily and add budesonide  0.25 mg twice daily with  AM and PM doses  Prednisone  10 mg take  4 each am x 2 days,   2 each am x 2 days,  1 each am x 2 days and stop   Depomedrol 120 mg IM   Pantoprazole  (protonix ) 40 mg   Take  30-60 min before first meal of the day and Pepcid  (famotidine )  20 mg after supper until return to office - this is the best way to tell whether stomach acid is contributing to your problem.    GERD (REFLUX)  is an extremely common cause of respiratory symptoms just like yours , many times with no obvious heartburn at all.    It can be treated with medication, but also with lifestyle changes including elevation of the head of your bed (ideally with 6 -8inch blocks under the headboard of your bed),  Smoking cessation, avoidance of late meals, excessive alcohol, and avoid fatty foods, chocolate, peppermint, colas, red wine, and acidic juices such as orange juice.  NO MINT OR MENTHOL PRODUCTS SO NO COUGH DROPS  USE SUGARLESS CANDY INSTEAD (Jolley ranchers or Stover's or Life Savers) or even ice chips will also do - the key is to swallow to prevent all throat clearing. NO OIL BASED VITAMINS - use powdered substitutes.  Avoid fish oil when coughing.   Mucinex  1200 mg every 12 hours until no longer coughing/ rattling when cough    Phenergan  dm 1  tsp every 4-6 h as needed to supplement the mucinex    Please remember to go to the  x-ray department  @  [facility] for your tests - we will call you with the results when they are available            Ozell America, MD 09/08/2024         "

## 2024-09-08 NOTE — Patient Instructions (Addendum)
 Duoneb four times daily and add budesonide  0.25 mg twice daily with  AM and PM doses  Prednisone  10 mg take  4 each am x 2 days,   2 each am x 2 days,  1 each am x 2 days and stop   Depomedrol 120 mg IM   Pantoprazole  (protonix ) 40 mg   Take  30-60 min before first meal of the day and Pepcid  (famotidine )  20 mg after supper until return to office - this is the best way to tell whether stomach acid is contributing to your problem.    GERD (REFLUX)  is an extremely common cause of respiratory symptoms just like yours , many times with no obvious heartburn at all.    It can be treated with medication, but also with lifestyle changes including elevation of the head of your bed (ideally with 6 -8inch blocks under the headboard of your bed),  Smoking cessation, avoidance of late meals, excessive alcohol, and avoid fatty foods, chocolate, peppermint, colas, red wine, and acidic juices such as orange juice.  NO MINT OR MENTHOL PRODUCTS SO NO COUGH DROPS  USE SUGARLESS CANDY INSTEAD (Jolley ranchers or Stover's or Life Savers) or even ice chips will also do - the key is to swallow to prevent all throat clearing. NO OIL BASED VITAMINS - use powdered substitutes.  Avoid fish oil when coughing.   Mucinex  1200 mg every 12 hours until no longer coughing/ rattling when cough    Phenergan  dm 1  tsp every 4-6 h as needed to supplement the mucinex    Please remember to go to the  x-ray department  @  [facility] for your tests - we will call you with the results when they are available

## 2024-09-08 NOTE — Assessment & Plan Note (Addendum)
 Has stopped while sick so challenge will be to continue off cigs once URI resolves.

## 2024-09-08 NOTE — Assessment & Plan Note (Addendum)
 Onset ? In pt with dementia on advair discus and ACEi  - 07/30/2024 d/c advair / vasotec  and re check in 6 weeks just on neb prn and mucinex  dm  - 09/08/2024 ov with URI p apparent improvement off acei and advair  s/p zpak/pred by PCP with prominent pseudowheeze on exam.  Of the three most common causes of  Sub-acute / recurrent or chronic cough, only one (GERD)  can actually contribute to/ trigger  the other two (asthma and post nasal drip syndrome)  and perpetuate the cylce of cough.  While not intuitively obvious, many patients with chronic low grade reflux do not cough until there is a primary insult that disturbs the protective epithelial barrier and exposes sensitive nerve endings.   This is typically viral but can due to PNDS and  the former probably applies here.     >>>  The point is that once this occurs, it is difficult to eliminate the cycle  using anything but a maximally effective acid suppression regimen at least in the short run, accompanied by an appropriate diet to address non acid GERD and control / eliminate the cough itself for at least 5 days with phenergan  DM  and  also added another 6 day taper off  Prednisone  starting at 40 mg per day in case of component of Th-2 driven upper or lower airways inflammation (if cough responds short term only to relapse before return while will on full rx for uacs (as above), then that would point to allergic rhinitis/ asthma or eos bronchitis as alternative dx)    Reviewed challenge with his daughter NP  re adherence and need for close f/u if not improving p apparent URI subsides

## 2024-09-08 NOTE — Assessment & Plan Note (Addendum)
 Not clear how much of this is AB vs UACS  >>> cover AB with duoneb qid with budesonide  0.25 mg bid until next ov         Each maintenance medication was reviewed in detail including emphasizing most importantly the difference between maintenance and prns and under what circumstances the prns are to be triggered using an action plan format where appropriate.  Total time for H and P, chart review, counseling, reviewing neb  device(s) and generating customized AVS unique to this office visit / same day charting = 42 min   For patient with multiple chronic  refractory respiratory  symptoms of uncertain etiology

## 2024-09-09 ENCOUNTER — Ambulatory Visit: Payer: Self-pay | Admitting: Internal Medicine

## 2024-09-10 ENCOUNTER — Ambulatory Visit: Admitting: Internal Medicine

## 2024-09-11 NOTE — Addendum Note (Signed)
 Addended by: DARLEAN SHARPER B on: 09/11/2024 06:16 AM   Modules accepted: Level of Service

## 2024-09-15 ENCOUNTER — Other Ambulatory Visit: Payer: Self-pay | Admitting: Internal Medicine

## 2024-09-15 ENCOUNTER — Other Ambulatory Visit: Payer: Self-pay | Admitting: Family Medicine

## 2024-09-15 DIAGNOSIS — F320 Major depressive disorder, single episode, mild: Secondary | ICD-10-CM

## 2024-09-15 DIAGNOSIS — Z122 Encounter for screening for malignant neoplasm of respiratory organs: Secondary | ICD-10-CM

## 2024-09-16 ENCOUNTER — Other Ambulatory Visit: Payer: Self-pay

## 2024-09-16 MED ORDER — DONEPEZIL HCL 5 MG PO TABS: 5.0000 mg | ORAL_TABLET | Freq: Every day | ORAL | 1 refills | Status: AC

## 2024-09-29 ENCOUNTER — Ambulatory Visit: Admitting: Internal Medicine

## 2025-06-25 ENCOUNTER — Ambulatory Visit
